# Patient Record
Sex: Female | Born: 1970 | Race: Black or African American | Hispanic: No | Marital: Married | Smoking: Never smoker
Health system: Southern US, Community
[De-identification: ages and names within clinical notes are randomized; demographics above are authoritative.]

## PROBLEM LIST (undated history)

## (undated) DIAGNOSIS — I428 Other cardiomyopathies: Secondary | ICD-10-CM

## (undated) DIAGNOSIS — I10 Essential (primary) hypertension: Secondary | ICD-10-CM

## (undated) DIAGNOSIS — E039 Hypothyroidism, unspecified: Secondary | ICD-10-CM

## (undated) DIAGNOSIS — M2550 Pain in unspecified joint: Secondary | ICD-10-CM

## (undated) DIAGNOSIS — R0600 Dyspnea, unspecified: Secondary | ICD-10-CM

## (undated) DIAGNOSIS — I509 Heart failure, unspecified: Secondary | ICD-10-CM

## (undated) DIAGNOSIS — Z91018 Allergy to other foods: Secondary | ICD-10-CM

## (undated) HISTORY — DX: Allergy to other foods: Z91.018

## (undated) HISTORY — DX: Essential (primary) hypertension: I10

## (undated) HISTORY — PX: MYOMECTOMY: SHX85

## (undated) HISTORY — DX: Dyspnea, unspecified: R06.00

## (undated) HISTORY — DX: Pain in unspecified joint: M25.50

## (undated) HISTORY — DX: Other cardiomyopathies: I42.8

## (undated) HISTORY — PX: OTHER SURGICAL HISTORY: SHX169

---

## 2002-07-23 ENCOUNTER — Emergency Department (HOSPITAL_COMMUNITY): Admission: EM | Admit: 2002-07-23 | Discharge: 2002-07-23 | Payer: Self-pay

## 2006-06-26 ENCOUNTER — Emergency Department (HOSPITAL_COMMUNITY): Admission: EM | Admit: 2006-06-26 | Discharge: 2006-06-26 | Payer: Self-pay | Admitting: Emergency Medicine

## 2007-08-23 ENCOUNTER — Ambulatory Visit: Payer: Self-pay | Admitting: Hematology and Oncology

## 2007-08-24 ENCOUNTER — Encounter: Admission: RE | Admit: 2007-08-24 | Discharge: 2007-08-24 | Payer: Self-pay | Admitting: Interventional Radiology

## 2007-08-24 LAB — CBC & DIFF AND RETIC
BASO%: 0.2 % (ref 0.0–2.0)
Basophils Absolute: 0 10*3/uL (ref 0.0–0.1)
EOS%: 4.7 % (ref 0.0–7.0)
Eosinophils Absolute: 0.3 10*3/uL (ref 0.0–0.5)
HCT: 29 % — ABNORMAL LOW (ref 34.8–46.6)
HGB: 8.8 g/dL — ABNORMAL LOW (ref 11.6–15.9)
IRF: 0.3 (ref 0.130–0.330)
LYMPH%: 24 % (ref 14.0–48.0)
MCH: 20.9 pg — ABNORMAL LOW (ref 26.0–34.0)
MCHC: 30.4 g/dL — ABNORMAL LOW (ref 32.0–36.0)
MCV: 68.6 fL — ABNORMAL LOW (ref 81.0–101.0)
MONO#: 0.4 10*3/uL (ref 0.1–0.9)
MONO%: 7.3 % (ref 0.0–13.0)
NEUT#: 3.9 10*3/uL (ref 1.5–6.5)
NEUT%: 63.8 % (ref 39.6–76.8)
Platelets: 406 10*3/uL — ABNORMAL HIGH (ref 145–400)
RBC: 4.23 10*6/uL (ref 3.70–5.32)
RDW: 17.9 % — ABNORMAL HIGH (ref 11.3–14.5)
RETIC #: 93.1 10*3/uL (ref 19.7–115.1)
Retic %: 2.2 % (ref 0.4–2.3)
WBC: 6.1 10*3/uL (ref 3.9–10.0)
lymph#: 1.5 10*3/uL (ref 0.9–3.3)

## 2007-08-26 LAB — COMPREHENSIVE METABOLIC PANEL
ALT: 24 U/L (ref 0–35)
AST: 31 U/L (ref 0–37)
Albumin: 4.1 g/dL (ref 3.5–5.2)
Alkaline Phosphatase: 84 U/L (ref 39–117)
BUN: 8 mg/dL (ref 6–23)
CO2: 25 mEq/L (ref 19–32)
Calcium: 9.4 mg/dL (ref 8.4–10.5)
Chloride: 104 mEq/L (ref 96–112)
Creatinine, Ser: 0.76 mg/dL (ref 0.40–1.20)
Glucose, Bld: 81 mg/dL (ref 70–99)
Potassium: 4.1 mEq/L (ref 3.5–5.3)
Sodium: 138 mEq/L (ref 135–145)
Total Bilirubin: 0.4 mg/dL (ref 0.3–1.2)
Total Protein: 7.8 g/dL (ref 6.0–8.3)

## 2007-08-26 LAB — IRON AND TIBC
%SAT: 42 % (ref 20–55)
Iron: 183 ug/dL — ABNORMAL HIGH (ref 42–145)
TIBC: 436 ug/dL (ref 250–470)
UIBC: 253 ug/dL

## 2007-08-26 LAB — DIRECT ANTIGLOBULIN TEST (NOT AT ARMC)
DAT (Complement): NEGATIVE
DAT IgG: NEGATIVE

## 2007-08-26 LAB — FERRITIN: Ferritin: 9 ng/mL — ABNORMAL LOW (ref 10–291)

## 2007-08-26 LAB — HEMOGLOBINOPATHY EVALUATION
Hemoglobin Other: 0 % (ref 0.0–0.0)
Hgb A2 Quant: 2.1 % — ABNORMAL LOW (ref 2.2–3.2)
Hgb A: 97.9 % — ABNORMAL HIGH (ref 96.8–97.8)
Hgb F Quant: 0 % (ref 0.0–2.0)
Hgb S Quant: 0 % (ref 0.0–0.0)

## 2007-08-26 LAB — HAPTOGLOBIN: Haptoglobin: 169 mg/dL (ref 16–200)

## 2007-08-26 LAB — LACTATE DEHYDROGENASE: LDH: 253 U/L — ABNORMAL HIGH (ref 94–250)

## 2007-08-26 LAB — VITAMIN B12: Vitamin B-12: 433 pg/mL (ref 211–911)

## 2007-08-31 LAB — VON WILLEBRAND PANEL
Factor-VIII Activity: 126 % (ref 75–150)
Ristocetin-Cofactor: 82 % (ref 50–150)
Von Willebrand Ag: 145 % normal (ref 61–164)

## 2007-08-31 LAB — FACTOR 11: Factor XI Activity: 73 % (ref 56–153)

## 2007-09-06 ENCOUNTER — Encounter: Admission: RE | Admit: 2007-09-06 | Discharge: 2007-09-06 | Payer: Self-pay | Admitting: Interventional Radiology

## 2007-09-28 LAB — CBC WITH DIFFERENTIAL/PLATELET
BASO%: 0.5 % (ref 0.0–2.0)
Basophils Absolute: 0 10*3/uL (ref 0.0–0.1)
EOS%: 6.5 % (ref 0.0–7.0)
Eosinophils Absolute: 0.4 10*3/uL (ref 0.0–0.5)
HCT: 34.9 % (ref 34.8–46.6)
HGB: 11.6 g/dL (ref 11.6–15.9)
LYMPH%: 27.4 % (ref 14.0–48.0)
MCH: 26.2 pg (ref 26.0–34.0)
MCHC: 33.3 g/dL (ref 32.0–36.0)
MCV: 78.7 fL — ABNORMAL LOW (ref 81.0–101.0)
MONO#: 0.4 10*3/uL (ref 0.1–0.9)
MONO%: 6.8 % (ref 0.0–13.0)
NEUT#: 3.2 10*3/uL (ref 1.5–6.5)
NEUT%: 58.8 % (ref 39.6–76.8)
Platelets: 305 10*3/uL (ref 145–400)
RBC: 4.43 10*6/uL (ref 3.70–5.32)
RDW: 28.5 % — ABNORMAL HIGH (ref 11.3–14.5)
WBC: 5.5 10*3/uL (ref 3.9–10.0)
lymph#: 1.5 10*3/uL (ref 0.9–3.3)

## 2007-09-28 LAB — FERRITIN: Ferritin: 59 ng/mL (ref 10–291)

## 2007-09-28 LAB — BASIC METABOLIC PANEL
BUN: 9 mg/dL (ref 6–23)
CO2: 23 mEq/L (ref 19–32)
Calcium: 9.2 mg/dL (ref 8.4–10.5)
Chloride: 106 mEq/L (ref 96–112)
Creatinine, Ser: 0.7 mg/dL (ref 0.40–1.20)
Glucose, Bld: 92 mg/dL (ref 70–99)
Potassium: 4.1 mEq/L (ref 3.5–5.3)
Sodium: 137 mEq/L (ref 135–145)

## 2007-09-28 LAB — IRON AND TIBC
%SAT: 9 % — ABNORMAL LOW (ref 20–55)
Iron: 29 ug/dL — ABNORMAL LOW (ref 42–145)
TIBC: 324 ug/dL (ref 250–470)
UIBC: 295 ug/dL

## 2007-10-26 ENCOUNTER — Ambulatory Visit (HOSPITAL_COMMUNITY): Admission: RE | Admit: 2007-10-26 | Discharge: 2007-10-27 | Payer: Self-pay | Admitting: Interventional Radiology

## 2007-11-09 ENCOUNTER — Encounter: Admission: RE | Admit: 2007-11-09 | Discharge: 2007-11-09 | Payer: Self-pay | Admitting: Interventional Radiology

## 2007-11-24 ENCOUNTER — Ambulatory Visit: Payer: Self-pay | Admitting: Hematology and Oncology

## 2007-11-26 LAB — CBC WITH DIFFERENTIAL/PLATELET
BASO%: 0.5 % (ref 0.0–2.0)
Basophils Absolute: 0 10*3/uL (ref 0.0–0.1)
EOS%: 7.4 % — ABNORMAL HIGH (ref 0.0–7.0)
Eosinophils Absolute: 0.4 10*3/uL (ref 0.0–0.5)
HCT: 37 % (ref 34.8–46.6)
HGB: 12.6 g/dL (ref 11.6–15.9)
LYMPH%: 33.1 % (ref 14.0–48.0)
MCH: 28 pg (ref 26.0–34.0)
MCHC: 34 g/dL (ref 32.0–36.0)
MCV: 82.4 fL (ref 81.0–101.0)
MONO#: 0.3 10*3/uL (ref 0.1–0.9)
MONO%: 6.6 % (ref 0.0–13.0)
NEUT#: 2.5 10*3/uL (ref 1.5–6.5)
NEUT%: 52.4 % (ref 39.6–76.8)
Platelets: 339 10*3/uL (ref 145–400)
RBC: 4.49 10*6/uL (ref 3.70–5.32)
RDW: 13.7 % (ref 11.3–14.5)
WBC: 4.8 10*3/uL (ref 3.9–10.0)
lymph#: 1.6 10*3/uL (ref 0.9–3.3)

## 2007-11-26 LAB — IRON AND TIBC
%SAT: 11 % — ABNORMAL LOW (ref 20–55)
Iron: 30 ug/dL — ABNORMAL LOW (ref 42–145)
TIBC: 277 ug/dL (ref 250–470)
UIBC: 247 ug/dL

## 2007-11-26 LAB — FERRITIN: Ferritin: 45 ng/mL (ref 10–291)

## 2007-11-26 LAB — BASIC METABOLIC PANEL
BUN: 9 mg/dL (ref 6–23)
CO2: 29 mEq/L (ref 19–32)
Calcium: 9.4 mg/dL (ref 8.4–10.5)
Chloride: 103 mEq/L (ref 96–112)
Creatinine, Ser: 0.85 mg/dL (ref 0.40–1.20)
Glucose, Bld: 84 mg/dL (ref 70–99)
Potassium: 4.3 mEq/L (ref 3.5–5.3)
Sodium: 142 mEq/L (ref 135–145)

## 2008-05-30 ENCOUNTER — Encounter: Admission: RE | Admit: 2008-05-30 | Discharge: 2008-05-30 | Payer: Self-pay | Admitting: Urology

## 2008-05-30 ENCOUNTER — Encounter: Admission: RE | Admit: 2008-05-30 | Discharge: 2008-05-30 | Payer: Self-pay | Admitting: Interventional Radiology

## 2008-11-30 ENCOUNTER — Ambulatory Visit (HOSPITAL_COMMUNITY): Admission: RE | Admit: 2008-11-30 | Discharge: 2008-11-30 | Payer: Self-pay | Admitting: Internal Medicine

## 2009-12-04 ENCOUNTER — Ambulatory Visit (HOSPITAL_COMMUNITY): Admission: RE | Admit: 2009-12-04 | Discharge: 2009-12-04 | Payer: Self-pay | Admitting: Internal Medicine

## 2010-06-01 ENCOUNTER — Inpatient Hospital Stay (HOSPITAL_COMMUNITY)
Admission: EM | Admit: 2010-06-01 | Discharge: 2010-06-04 | Payer: Self-pay | Source: Home / Self Care | Admitting: Emergency Medicine

## 2010-06-01 ENCOUNTER — Ambulatory Visit: Payer: Self-pay | Admitting: Cardiovascular Disease

## 2010-06-01 ENCOUNTER — Ambulatory Visit: Payer: Self-pay | Admitting: Internal Medicine

## 2010-06-01 ENCOUNTER — Encounter (INDEPENDENT_AMBULATORY_CARE_PROVIDER_SITE_OTHER): Payer: Self-pay | Admitting: Internal Medicine

## 2010-08-04 ENCOUNTER — Encounter: Payer: Self-pay | Admitting: Internal Medicine

## 2010-08-04 ENCOUNTER — Encounter: Payer: Self-pay | Admitting: Interventional Radiology

## 2010-09-24 LAB — CBC
HCT: 40.1 % (ref 36.0–46.0)
HCT: 40.8 % (ref 36.0–46.0)
HCT: 41.5 % (ref 36.0–46.0)
Hemoglobin: 13.3 g/dL (ref 12.0–15.0)
Hemoglobin: 13.5 g/dL (ref 12.0–15.0)
Hemoglobin: 13.8 g/dL (ref 12.0–15.0)
MCH: 29.2 pg (ref 26.0–34.0)
MCH: 29.5 pg (ref 26.0–34.0)
MCH: 29.5 pg (ref 26.0–34.0)
MCHC: 33.1 g/dL (ref 30.0–36.0)
MCHC: 33.2 g/dL (ref 30.0–36.0)
MCHC: 33.3 g/dL (ref 30.0–36.0)
MCV: 87.7 fL (ref 78.0–100.0)
MCV: 88.9 fL (ref 78.0–100.0)
MCV: 89.3 fL (ref 78.0–100.0)
Platelets: 247 10*3/uL (ref 150–400)
Platelets: 282 10*3/uL (ref 150–400)
Platelets: 286 10*3/uL (ref 150–400)
RBC: 4.51 MIL/uL (ref 3.87–5.11)
RBC: 4.57 MIL/uL (ref 3.87–5.11)
RBC: 4.73 MIL/uL (ref 3.87–5.11)
RDW: 12.6 % (ref 11.5–15.5)
RDW: 13 % (ref 11.5–15.5)
RDW: 13 % (ref 11.5–15.5)
WBC: 4.7 10*3/uL (ref 4.0–10.5)
WBC: 6.4 10*3/uL (ref 4.0–10.5)
WBC: 6.6 10*3/uL (ref 4.0–10.5)

## 2010-09-24 LAB — HEMOGLOBIN A1C
Hgb A1c MFr Bld: 5.1 % (ref <5.7)
Hgb A1c MFr Bld: 5.2 % (ref <5.7)
Mean Plasma Glucose: 100 mg/dL (ref <117)
Mean Plasma Glucose: 103 mg/dL (ref <117)

## 2010-09-24 LAB — DIFFERENTIAL
Basophils Absolute: 0 10*3/uL (ref 0.0–0.1)
Basophils Absolute: 0 10*3/uL (ref 0.0–0.1)
Basophils Relative: 0 % (ref 0–1)
Basophils Relative: 0 % (ref 0–1)
Eosinophils Absolute: 0.4 10*3/uL (ref 0.0–0.7)
Eosinophils Absolute: 0.5 10*3/uL (ref 0.0–0.7)
Eosinophils Relative: 7 % — ABNORMAL HIGH (ref 0–5)
Eosinophils Relative: 9 % — ABNORMAL HIGH (ref 0–5)
Lymphocytes Relative: 37 % (ref 12–46)
Lymphocytes Relative: 44 % (ref 12–46)
Lymphs Abs: 1.8 10*3/uL (ref 0.7–4.0)
Lymphs Abs: 2.9 10*3/uL (ref 0.7–4.0)
Monocytes Absolute: 0.4 10*3/uL (ref 0.1–1.0)
Monocytes Absolute: 0.4 10*3/uL (ref 0.1–1.0)
Monocytes Relative: 5 % (ref 3–12)
Monocytes Relative: 8 % (ref 3–12)
Neutro Abs: 2.2 10*3/uL (ref 1.7–7.7)
Neutro Abs: 2.9 10*3/uL (ref 1.7–7.7)
Neutrophils Relative %: 43 % (ref 43–77)
Neutrophils Relative %: 46 % (ref 43–77)

## 2010-09-24 LAB — TROPONIN I: Troponin I: 0.03 ng/mL (ref 0.00–0.06)

## 2010-09-24 LAB — CARDIAC PANEL(CRET KIN+CKTOT+MB+TROPI)
CK, MB: 7.4 ng/mL (ref 0.3–4.0)
CK, MB: 9.8 ng/mL (ref 0.3–4.0)
Relative Index: 0.8 (ref 0.0–2.5)
Relative Index: 0.9 (ref 0.0–2.5)
Total CK: 1143 U/L — ABNORMAL HIGH (ref 7–177)
Total CK: 981 U/L — ABNORMAL HIGH (ref 7–177)
Troponin I: 0.02 ng/mL (ref 0.00–0.06)
Troponin I: 0.02 ng/mL (ref 0.00–0.06)

## 2010-09-24 LAB — BASIC METABOLIC PANEL
BUN: 7 mg/dL (ref 6–23)
BUN: 7 mg/dL (ref 6–23)
BUN: 8 mg/dL (ref 6–23)
BUN: 9 mg/dL (ref 6–23)
CO2: 27 mEq/L (ref 19–32)
CO2: 28 mEq/L (ref 19–32)
CO2: 29 mEq/L (ref 19–32)
CO2: 31 mEq/L (ref 19–32)
Calcium: 8.7 mg/dL (ref 8.4–10.5)
Calcium: 8.8 mg/dL (ref 8.4–10.5)
Calcium: 9.1 mg/dL (ref 8.4–10.5)
Calcium: 9.2 mg/dL (ref 8.4–10.5)
Chloride: 104 mEq/L (ref 96–112)
Chloride: 105 mEq/L (ref 96–112)
Chloride: 106 mEq/L (ref 96–112)
Chloride: 107 mEq/L (ref 96–112)
Creatinine, Ser: 0.73 mg/dL (ref 0.4–1.2)
Creatinine, Ser: 0.83 mg/dL (ref 0.4–1.2)
Creatinine, Ser: 0.87 mg/dL (ref 0.4–1.2)
Creatinine, Ser: 0.92 mg/dL (ref 0.4–1.2)
GFR calc Af Amer: >60 mL/min (ref >60)
GFR calc Af Amer: >60 mL/min (ref >60)
GFR calc Af Amer: >60 mL/min (ref >60)
GFR calc Af Amer: >60 mL/min (ref >60)
GFR calc non Af Amer: >60 mL/min (ref >60)
GFR calc non Af Amer: >60 mL/min (ref >60)
GFR calc non Af Amer: >60 mL/min (ref >60)
GFR calc non Af Amer: >60 mL/min (ref >60)
Glucose, Bld: 100 mg/dL — ABNORMAL HIGH (ref 70–99)
Glucose, Bld: 110 mg/dL — ABNORMAL HIGH (ref 70–99)
Glucose, Bld: 84 mg/dL (ref 70–99)
Glucose, Bld: 97 mg/dL (ref 70–99)
Potassium: 3 mEq/L — ABNORMAL LOW (ref 3.5–5.1)
Potassium: 3.9 mEq/L (ref 3.5–5.1)
Potassium: 4.1 mEq/L (ref 3.5–5.1)
Potassium: 4.1 mEq/L (ref 3.5–5.1)
Sodium: 137 mEq/L (ref 135–145)
Sodium: 140 mEq/L (ref 135–145)
Sodium: 140 mEq/L (ref 135–145)
Sodium: 142 mEq/L (ref 135–145)

## 2010-09-24 LAB — CK TOTAL AND CKMB (NOT AT ARMC)
CK, MB: 10.2 ng/mL (ref 0.3–4.0)
CK, MB: 12.2 ng/mL (ref 0.3–4.0)
Relative Index: 0.7 (ref 0.0–2.5)
Relative Index: 0.8 (ref 0.0–2.5)
Total CK: 1365 U/L — ABNORMAL HIGH (ref 7–177)
Total CK: 1498 U/L — ABNORMAL HIGH (ref 7–177)

## 2010-09-24 LAB — POCT I-STAT 3, VENOUS BLOOD GAS (G3P V)
Acid-base deficit: 2 mmol/L (ref 0.0–2.0)
Bicarbonate: 23.7 mEq/L (ref 20.0–24.0)
O2 Saturation: 68 %
TCO2: 25 mmol/L (ref 0–100)
pCO2, Ven: 43.1 mmHg — ABNORMAL LOW (ref 45.0–50.0)
pH, Ven: 7.349 — ABNORMAL HIGH (ref 7.250–7.300)
pO2, Ven: 38 mmHg (ref 30.0–45.0)

## 2010-09-24 LAB — BRAIN NATRIURETIC PEPTIDE
Pro B Natriuretic peptide (BNP): 273 pg/mL — ABNORMAL HIGH (ref 0.0–100.0)
Pro B Natriuretic peptide (BNP): 676 pg/mL — ABNORMAL HIGH (ref 0.0–100.0)
Pro B Natriuretic peptide (BNP): 762 pg/mL — ABNORMAL HIGH (ref 0.0–100.0)

## 2010-09-24 LAB — LIPID PANEL
Cholesterol: 121 mg/dL (ref 0–200)
HDL: 36 mg/dL — ABNORMAL LOW (ref >39)
LDL Cholesterol: 79 mg/dL (ref 0–99)
Total CHOL/HDL Ratio: 3.4 RATIO
Triglycerides: 29 mg/dL (ref <150)
VLDL: 6 mg/dL (ref 0–40)

## 2010-09-24 LAB — PROTIME-INR
INR: 1 (ref 0.00–1.49)
INR: 1.03 (ref 0.00–1.49)
INR: 1.04 (ref 0.00–1.49)
Prothrombin Time: 13.4 seconds (ref 11.6–15.2)
Prothrombin Time: 13.7 seconds (ref 11.6–15.2)
Prothrombin Time: 13.8 seconds (ref 11.6–15.2)

## 2010-09-24 LAB — MAGNESIUM
Magnesium: 1.8 mg/dL (ref 1.5–2.5)
Magnesium: 2 mg/dL (ref 1.5–2.5)

## 2010-09-24 LAB — POCT I-STAT 3, ART BLOOD GAS (G3+)
Acid-base deficit: 2 mmol/L (ref 0.0–2.0)
Bicarbonate: 23.8 mEq/L (ref 20.0–24.0)
O2 Saturation: 91 %
TCO2: 25 mmol/L (ref 0–100)
pCO2 arterial: 44.7 mmHg (ref 35.0–45.0)
pH, Arterial: 7.334 — ABNORMAL LOW (ref 7.350–7.400)
pO2, Arterial: 65 mmHg — ABNORMAL LOW (ref 80.0–100.0)

## 2010-09-24 LAB — TSH
TSH: 2.5 u[IU]/mL (ref 0.350–4.500)
TSH: 3.308 u[IU]/mL (ref 0.350–4.500)

## 2010-09-24 LAB — APTT: aPTT: 33 seconds (ref 24–37)

## 2010-09-24 LAB — PREGNANCY, URINE: Preg Test, Ur: NEGATIVE

## 2011-04-08 LAB — CBC
HCT: 38.8
Hemoglobin: 13.1
MCHC: 33.7
MCV: 83.3
Platelets: 308
RBC: 4.65
RDW: 22.8 — ABNORMAL HIGH
WBC: 5.5

## 2011-04-08 LAB — CREATININE, SERUM
Creatinine, Ser: 0.78
GFR calc Af Amer: >60
GFR calc non Af Amer: >60

## 2011-04-08 LAB — HCG, SERUM, QUALITATIVE: Preg, Serum: NEGATIVE

## 2013-03-04 DIAGNOSIS — L910 Hypertrophic scar: Secondary | ICD-10-CM | POA: Insufficient documentation

## 2013-06-09 ENCOUNTER — Other Ambulatory Visit: Payer: Self-pay | Admitting: Interventional Cardiology

## 2013-09-29 ENCOUNTER — Other Ambulatory Visit: Payer: Self-pay | Admitting: Interventional Cardiology

## 2013-12-20 ENCOUNTER — Encounter: Payer: Self-pay | Admitting: Interventional Cardiology

## 2014-01-17 ENCOUNTER — Ambulatory Visit: Payer: Self-pay | Admitting: Interventional Cardiology

## 2014-01-19 ENCOUNTER — Other Ambulatory Visit: Payer: Self-pay | Admitting: Interventional Cardiology

## 2014-02-09 ENCOUNTER — Encounter: Payer: Self-pay | Admitting: Interventional Cardiology

## 2014-02-09 ENCOUNTER — Ambulatory Visit (INDEPENDENT_AMBULATORY_CARE_PROVIDER_SITE_OTHER): Admitting: Interventional Cardiology

## 2014-02-09 VITALS — BP 120/80 | HR 63 | Ht 68.0 in | Wt 238.1 lb

## 2014-02-09 DIAGNOSIS — Z6841 Body Mass Index (BMI) 40.0 and over, adult: Secondary | ICD-10-CM | POA: Insufficient documentation

## 2014-02-09 DIAGNOSIS — E039 Hypothyroidism, unspecified: Secondary | ICD-10-CM | POA: Insufficient documentation

## 2014-02-09 DIAGNOSIS — I10 Essential (primary) hypertension: Secondary | ICD-10-CM

## 2014-02-09 DIAGNOSIS — E0789 Other specified disorders of thyroid: Secondary | ICD-10-CM

## 2014-02-09 DIAGNOSIS — I5042 Chronic combined systolic (congestive) and diastolic (congestive) heart failure: Secondary | ICD-10-CM

## 2014-02-09 DIAGNOSIS — I5043 Acute on chronic combined systolic (congestive) and diastolic (congestive) heart failure: Secondary | ICD-10-CM | POA: Insufficient documentation

## 2014-02-09 DIAGNOSIS — E038 Other specified hypothyroidism: Secondary | ICD-10-CM | POA: Insufficient documentation

## 2014-02-09 DIAGNOSIS — E034 Atrophy of thyroid (acquired): Secondary | ICD-10-CM

## 2014-02-09 NOTE — Patient Instructions (Addendum)
Your physician recommends that you continue on your current medications as directed. Please refer to the Current Medication list given to you today.  You have been referred to Kerby Less (Dr.Kerr)  Your physician wants you to follow-up in: 1 year with Dr.Smith You will receive a reminder letter in the mail two months in advance. If you don't receive a letter, please call our office to schedule the follow-up appointment.

## 2014-02-09 NOTE — Progress Notes (Signed)
Patient ID: Suzanne Long, female   DOB: 02/12/1971, 43 y.o.   MRN: [id number]    [address] [place],   [address] Phone: [phone] Fax:  [phone]  Date:  1/66/0630   ID:  Suzanne Long, DOB [birth year], MRN [id number]  PCP:  No primary provider on file.   ASSESSMENT:  1. Chronic combined systolic and diastolic heart failure 2. Hypertension, controlled 3. Obesity with weight loss 4. Hypothyroidism not been adequately treated  PLAN:  1. Continue the current cardiovascular management 2. Endocrinology consult 3. Continue aerobic activity and weight loss   SUBJECTIVE: Suzanne Long is a 43 y.o. female was concerned as she has been working out on a regular basis for nearly a year and has not lost any weight. She has hypothyroidism but has stopped taking Synthroid. She wants to manage her thyroid problem naturally. She denies cardiopulmonary complaints. There is no dyspnea. She is taking her medications regularly.   Wt Readings from Last 3 Encounters:  02/09/14 238 lb 1.9 oz (108.011 kg)     No past medical history on file.  Current Outpatient Prescriptions  Medication Sig Dispense Refill  . amLODipine (NORVASC) 5 MG tablet take 1 tablet by mouth once daily  30 tablet  1  . carvedilol (COREG) 25 MG tablet take 1 tablet by mouth twice a day with food  60 tablet  4  . furosemide (LASIX) 40 MG tablet take 1 tablet by mouth once daily  30 tablet  4  . lisinopril (PRINIVIL,ZESTRIL) 40 MG tablet take 1 tablet by mouth once daily  30 tablet  6   No current facility-administered medications for this visit.    Allergies:    Allergies  Allergen Reactions  . Shellfish Allergy Anaphylaxis    Uncoded Allergy. Allergen: SHELLFISH  . Iohexol      Desc: vomiting after 20 cc multihance injection     Social History:  The patient  reports that she has never smoked. She does not have any smokeless tobacco history on file.   ROS:  Please see the history of  present illness.   She denies heat and cold intolerance. She denies excessive fatigue. No snoring.   All other systems reviewed and negative.   OBJECTIVE: VS:  BP 120/80  Pulse 63  Ht $R'5\' 8"'Kr$  (1.727 m)  Wt 238 lb 1.9 oz (108.011 kg)  BMI 36.21 kg/m2 Well nourished, well developed, in no acute distress, obese but improved since my last office visit HEENT: normal Neck: JVD flat. Carotid bruit absent  Cardiac:  normal S1, S2; RRR; no murmur Lungs:  clear to auscultation bilaterally, no wheezing, rhonchi or rales Abd: soft, nontender, no hepatomegaly Ext: Edema absent. Pulses 2+ and symmetric Skin: warm and dry Neuro:  CNs 2-12 intact, no focal abnormalities noted  EKG:  Normal sinus rhythm with T-wave abnormality lead 1, lead aVL, and V4 through V6. No significant change when compared to prior.       Signed, Illene Labrador III, MD 02/09/2014 11:56 AM

## 2014-03-23 ENCOUNTER — Other Ambulatory Visit: Payer: Self-pay

## 2014-03-23 DIAGNOSIS — E039 Hypothyroidism, unspecified: Secondary | ICD-10-CM

## 2014-04-13 ENCOUNTER — Ambulatory Visit: Admitting: Endocrinology

## 2014-06-27 ENCOUNTER — Other Ambulatory Visit: Payer: Self-pay | Admitting: *Deleted

## 2014-06-27 MED ORDER — LISINOPRIL 40 MG PO TABS: 40.0000 mg | ORAL_TABLET | Freq: Every day | ORAL | Status: DC

## 2014-07-27 ENCOUNTER — Encounter: Payer: Self-pay | Admitting: Neurology

## 2014-07-27 ENCOUNTER — Ambulatory Visit (INDEPENDENT_AMBULATORY_CARE_PROVIDER_SITE_OTHER): Admitting: Neurology

## 2014-07-27 VITALS — BP 141/90 | HR 60 | Ht 68.5 in | Wt 235.4 lb

## 2014-07-27 DIAGNOSIS — H4710 Unspecified papilledema: Secondary | ICD-10-CM

## 2014-07-27 NOTE — Progress Notes (Addendum)
[facility]    Provider:  Dr Jaynee Eagles Referring Provider: Calton Dach, MD Primary Care Physician:  No primary care provider on file.  CC:  Papilledema  HPI:  Suzanne Long is a 44 y.o. 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 PMHx HTN, hypothyroidism. She was in her usual state of health. She went to her eye doctor just for a regular exam to get new glasses and contacts. Optometrist observed disk swelling the back of the right eye. Vision is worsening in both eyes and she really didn't notice anything out of the ordinary, just age-related vision changes. Right eye vision is worse than left. No idea when the papilledema started, the vision changes have been ongoing and gradual and she hasn't been to an eye doctor in 5 years. She has occ headaches but nothing significant. No double vision. No pain on eye movement. No vision loss. Deies ingestion of toxins. She does have HTN and occ doesn't take her medications. If she forgets her medication she has blood pressure above the 200s, takes her medications regularly except for maybe a few times a month. Denies any focal neurologic changes. Denies significant weight gain.     Reviewed notes, labs and imaging from outside physicians, which showed: Optometrist describes optic nerve edema without vasculopathy of the retina with a drop in best corrected vision OD. There is no indication how long the right eye vision has been worse than the left. Last exam in 2010 showed 20/20 vision in both eyes.   Review of Systems: Patient complains of symptoms per HPI as well as the following symptoms blurred vision. No CP, no SOB.  Pertinent negatives per HPI. All others negative.   History   Social History  . Marital Status: Married    Spouse Name: [name]    Number of Children: 2  . Years of Education: 12   Occupational History  . Not on file.   Social History Main Topics  . Smoking status: Never Smoker   .  Smokeless tobacco: Never Used  . Alcohol Use: 0.0 oz/week    0 Not specified per week     Comment: Occ  . Drug Use: No  . Sexual Activity: Not on file   Other Topics Concern  . Not on file   Social History Narrative   Patient is married husband name [name]    Patient has 2 children. Both has past away.    Patient is right handed.    Patient is unemployed    Patient has a high school education        Family History  Problem Relation Age of Onset  . Migraines Neg Hx   . Multiple sclerosis Neg Hx     Past Medical History  Diagnosis Date  . Hypertension     Past Surgical History  Procedure Laterality Date  . None      Current Outpatient Prescriptions  Medication Sig Dispense Refill  . amLODipine (NORVASC) 5 MG tablet take 1 tablet by mouth once daily 30 tablet 1  . carvedilol (COREG) 25 MG tablet take 1 tablet by mouth twice a day with food 60 tablet 4  . furosemide (LASIX) 40 MG tablet take 1 tablet by mouth once daily 30 tablet 4  . lisinopril (PRINIVIL,ZESTRIL) 40 MG tablet Take 1 tablet (40 mg total) by mouth daily. 30 tablet 6   No current facility-administered medications for this visit.    Allergies as of 07/27/2014 - Review Complete  07/27/2014  Allergen Reaction Noted  . Shellfish allergy Anaphylaxis 02/09/2014  . Iohexol  05/30/2008    Vitals: BP 141/90 mmHg  Pulse 60  Ht 5' 8.5" (1.74 m)  Wt 235 lb 6.4 oz (106.777 kg)  BMI 35.27 kg/m2 Last Weight:  Wt Readings from Last 1 Encounters:  07/27/14 235 lb 6.4 oz (106.777 kg)   Last Height:   Ht Readings from Last 1 Encounters:  07/27/14 5' 8.5" (1.74 m)   Physical exam: Exam: Gen: NAD, conversant, well nourised, obese            CV: RRR, no MRG. No Carotid Bruits. No peripheral edema, warm, nontender Eyes: Conjunctivae clear without exudates or hemorrhage  Neuro: Detailed Neurologic Exam  Speech:    Speech is normal; fluent and spontaneous with normal comprehension.  Cognition:    The  patient is oriented to person, place, and time;     recent and remote memory intact;     language fluent;     normal attention, concentration,     fund of knowledge Cranial Nerves:    The pupils are equal, round, and reactive to light. Mild blurring of the disc margins rt.  Rt 20/100, left 20/30. Visual fields are full to finger confrontation. Extraocular movements are intact. Trigeminal sensation is intact and the muscles of mastication are normal. The face is symmetric. The palate elevates in the midline. Hearing intact. Voice is normal. Shoulder shrug is normal. The tongue has normal motion without fasciculations.   Coordination:    Normal finger to nose and heel to shin. Normal rapid alternating movements.   Gait:    Heel-toe and tandem gait are normal.   Motor Observation:    No asymmetry, no atrophy, and no involuntary movements noted. Tone:    Normal muscle tone.    Posture:    Posture is normal. normal erect    Strength:    Strength is V/V in the upper and lower limbs.      Sensation: intact     Reflex Exam:  DTR's:    Deep tendon reflexes in the upper and lower extremities are normal bilaterally.   Toes:    The toes are downgoing bilaterally.   Clonus:    Clonus is absent.       Assessment/Plan:  44 year old female with HTN who presents with right disk abnormalities and worsening/blurry vision. Denies any other symptoms. 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 If neuroimaging is negative will proceed to LP and VF testing. MRI of the brain and orbits. Will order labs for other disorders that can be implicated in papilledema such as hypercoagulable disorders or sarcoid.    Sarina Ill, MD  [facility] [address] [place], [place] [address]  Phone [phone] Fax [phone]  A total of 30 minutes was spent in with this patient.  Over half this time was spent on counseling patient on the diagnosis and different therapeutic options available.

## 2014-07-27 NOTE — Patient Instructions (Signed)
Overall you are doing fairly well but I do want to suggest a few things today:   Remember to drink plenty of fluid, eat healthy meals and do not skip any meals. Try to eat protein with a every meal and eat a healthy snack such as fruit or nuts in between meals. Try to keep a regular sleep-wake schedule and try to exercise daily, particularly in the form of walking, 20-30 minutes a day, if you can.   As far as diagnostic testing: MRI of the brain, labwork  I would like to see you back in 6 weeks, sooner if we need to. Please call us with any interim questions, concerns, problems, updates or refill requests.   Please also call us for any test results so we can go over those with you on the phone.  My clinical assistant and will answer any of your questions and relay your messages to me and also relay most of my messages to you.   Our phone number is [phone]. We also have an after hours call service for urgent matters and there is a physician on-call for urgent questions. For any emergencies you know to call 911 or go to the nearest emergency room

## 2014-07-28 LAB — RPR: RPR Ser Ql: NONREACTIVE

## 2014-08-07 LAB — SEDIMENTATION RATE: Sed Rate: 7 mm/hr (ref 0–32)

## 2014-08-07 LAB — HYPERCOAGULABLE PANEL, COMPREHENSIVE
APTT: 31.9 s
AT III Act/Nor PPP Chro: 104 %
Act. Prt C Resist w/FV Defic.: 2.4 ratio
Anticardiolipin Ab, IgG: <10 [GPL'U]
Anticardiolipin Ab, IgM: <10 [MPL'U]
Beta-2 Glycoprotein I, IgA: <10 SAU
Beta-2 Glycoprotein I, IgG: <10 SGU
Beta-2 Glycoprotein I, IgM: <10 SMU
DRVVT Screen Seconds: 51.1 s
Factor VII Antigen**: 84 %
Factor VIII Activity: 183 % — ABNORMAL HIGH
Hexagonal Phospholipid Neutral: 0 s
Homocysteine: 10.3 umol/L
Prot C Ag Act/Nor PPP Imm: 86 %
Prot S Ag Act/Nor PPP Imm: 87 %
Protein C Ag/FVII Ag Ratio**: 1 ratio
Protein S Ag/FVII Ag Ratio**: 1 ratio

## 2014-08-07 LAB — CBC
HCT: 42.5 % (ref 34.0–46.6)
Hemoglobin: 14.4 g/dL (ref 11.1–15.9)
MCH: 30.1 pg (ref 26.6–33.0)
MCHC: 33.9 g/dL (ref 31.5–35.7)
MCV: 89 fL (ref 79–97)
Platelets: 270 10*3/uL (ref 150–379)
RBC: 4.79 x10E6/uL (ref 3.77–5.28)
RDW: 14.3 % (ref 12.3–15.4)
WBC: 5.7 10*3/uL (ref 3.4–10.8)

## 2014-08-07 LAB — BASIC METABOLIC PANEL
BUN/Creatinine Ratio: 13 (ref 9–23)
BUN: 12 mg/dL (ref 6–24)
CO2: 27 mmol/L (ref 18–29)
Calcium: 9.4 mg/dL (ref 8.7–10.2)
Chloride: 100 mmol/L (ref 97–108)
Creatinine, Ser: 0.89 mg/dL (ref 0.57–1.00)
GFR calc Af Amer: 92 mL/min/{1.73_m2} (ref >59)
GFR calc non Af Amer: 80 mL/min/{1.73_m2} (ref >59)
Glucose: 67 mg/dL (ref 65–99)
Potassium: 3.6 mmol/L (ref 3.5–5.2)
Sodium: 140 mmol/L (ref 134–144)

## 2014-08-07 LAB — HEMOGLOBIN A1C
Est. average glucose Bld gHb Est-mCnc: 108 mg/dL
Hgb A1c MFr Bld: 5.4 % (ref 4.8–5.6)

## 2014-08-07 LAB — HIV ANTIBODY (ROUTINE TESTING W REFLEX)
HIV 1/O/2 Abs-Index Value: <1 (ref <1.00)
HIV-1/HIV-2 Ab: NONREACTIVE

## 2014-08-07 LAB — HEMOGLOBINOPATHY EVALUATION
Hgb A2 Quant: 2.2 % (ref 0.7–3.1)
Hgb A: 97.8 % (ref 94.0–98.0)
Hgb C: 0 %
Hgb F Quant: 0 % (ref 0.0–2.0)
Hgb S: 0 %
Hgb Solubility: NEGATIVE

## 2014-08-07 LAB — PAN-ANCA
ANCA Proteinase 3: <3.5 U/mL (ref 0.0–3.5)
Atypical pANCA: <1:20 {titer}
C-ANCA: <1:20 {titer}
Myeloperoxidase Ab: <9 U/mL (ref 0.0–9.0)
P-ANCA: <1:20 {titer}

## 2014-08-07 LAB — HEAVY METALS, BLOOD
Arsenic: 7 ug/L (ref 2–23)
Lead, Blood: NOT DETECTED ug/dL (ref 0–19)
Mercury: 1.4 ug/L (ref 0.0–14.9)

## 2014-08-07 LAB — ANGIOTENSIN CONVERTING ENZYME: Angio Convert Enzyme: <14 U/L — ABNORMAL LOW (ref 14–82)

## 2014-08-07 LAB — TSH: TSH: 0.822 u[IU]/mL (ref 0.450–4.500)

## 2014-08-07 LAB — ANA W/REFLEX: Anti Nuclear Antibody(ANA): NEGATIVE

## 2014-08-07 LAB — RPR QUALITATIVE: RPR Ser Ql: NONREACTIVE

## 2014-08-07 LAB — HIGH SENSITIVITY CRP: CRP, High Sensitivity: 8.1 mg/L — ABNORMAL HIGH (ref 0.00–3.00)

## 2014-08-10 ENCOUNTER — Ambulatory Visit
Admission: RE | Admit: 2014-08-10 | Discharge: 2014-08-10 | Disposition: A | Discharge status: Home / Self Care | Source: Ambulatory Visit | Attending: Neurology | Admitting: Neurology

## 2014-08-10 ENCOUNTER — Other Ambulatory Visit: Payer: Self-pay | Admitting: Neurology

## 2014-08-10 DIAGNOSIS — H4710 Unspecified papilledema: Secondary | ICD-10-CM

## 2014-08-16 ENCOUNTER — Telehealth: Payer: Self-pay

## 2014-08-16 NOTE — Telephone Encounter (Signed)
Spoke to patient. Gave lab results. Patient verbalized understanding.  

## 2014-08-16 NOTE — Telephone Encounter (Signed)
-----   Message from Melvenia Beam, MD sent at 08/14/2014  5:35 PM EST ----- Please let patient know all her labs were either normal, within normal limits or, if mildly elevated, not concerning. We can discuss in detail before you call, thank you.

## 2014-08-22 ENCOUNTER — Telehealth: Payer: Self-pay | Admitting: *Deleted

## 2014-08-23 ENCOUNTER — Ambulatory Visit
Admission: RE | Admit: 2014-08-23 | Discharge: 2014-08-23 | Disposition: A | Discharge status: Home / Self Care | Source: Ambulatory Visit | Attending: Neurology | Admitting: Neurology

## 2014-08-23 DIAGNOSIS — H4710 Unspecified papilledema: Secondary | ICD-10-CM

## 2014-08-23 MED ORDER — GADOBENATE DIMEGLUMINE 529 MG/ML IV SOLN
20.0000 mL | Freq: Once | INTRAVENOUS | Status: AC | PRN
  Administered 2014-08-23: 20 mL via INTRAVENOUS

## 2014-08-29 ENCOUNTER — Other Ambulatory Visit: Payer: Self-pay | Admitting: Neurology

## 2014-08-29 ENCOUNTER — Telehealth: Payer: Self-pay | Admitting: Neurology

## 2014-08-29 DIAGNOSIS — H4710 Unspecified papilledema: Secondary | ICD-10-CM

## 2014-08-29 NOTE — Telephone Encounter (Signed)
Dr. Jaynee Eagles already called patient and spoke with her.

## 2014-08-29 NOTE — Telephone Encounter (Signed)
Spoke to patient today, MRI of the brain unremarkable. Discussed labs, unremarkable. 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 Will refer to Ophthalmology for dilated exam and VF testing. Workup so far negative likely pseudopapilledema.

## 2014-09-07 ENCOUNTER — Ambulatory Visit: Admitting: Neurology

## 2014-09-19 ENCOUNTER — Ambulatory Visit (INDEPENDENT_AMBULATORY_CARE_PROVIDER_SITE_OTHER): Admitting: Neurology

## 2014-09-19 ENCOUNTER — Encounter: Payer: Self-pay | Admitting: Neurology

## 2014-09-19 VITALS — BP 139/83 | HR 75 | Ht 68.5 in | Wt 240.0 lb

## 2014-09-19 DIAGNOSIS — G932 Benign intracranial hypertension: Secondary | ICD-10-CM | POA: Diagnosis not present

## 2014-09-19 NOTE — Patient Instructions (Addendum)
Please call [facility] for scheduling of LP [phone]

## 2014-09-19 NOTE — Progress Notes (Signed)
[facility]    Provider:  Dr Jaynee Eagles Referring Provider: No ref. provider found Primary Care Physician:  REDMON,NOELLE, PA-C  CC: Papilledema  HPI: Suzanne Long is a 44 y.o. 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 PMHx HTN, hypothyroidism. She was in her usual state of health. She went to her eye doctor just for a regular exam to get new glasses and contacts. Optometrist observed disk swelling the back of the right eye. Vision is worsening in both eyes and she really didn't notice anything out of the ordinary, just age-related vision changes. Right eye vision is worse than left. No idea when the papilledema started, the vision changes have been ongoing and gradual and she hasn't been to an eye doctor in 5 years. She has occ headaches but nothing significant. No double vision. No pain on eye movement. No vision loss. Deies ingestion of toxins. She does have HTN and occ doesn't take her medications. If she forgets her medication she has blood pressure above the 200s, takes her medications regularly except for maybe a few times a month. Denies any focal neurologic changes. Denies significant weight gain.     Reviewed notes, labs and imaging from outside physicians, which showed: Optometrist describes optic nerve edema without vasculopathy of the retina with a drop in best corrected vision OD. There is no indication how long the right eye vision has been worse than the left. Last exam in 2010 showed 20/20 vision in both eyes.  Review of Systems: Patient complains of symptoms per HPI as well as the following symptoms:No CP, no SOB. Pertinent negatives per HPI. All others negative.   History   Social History  . Marital Status: Married    Spouse Name: Suzanne Long  . Number of Children: 2  . Years of Education: 12   Occupational History  . Not on file.   Social History Main Topics  . Smoking status: Never Smoker   . Smokeless tobacco: Never Used  .  Alcohol Use: 0.0 oz/week    0 Standard drinks or equivalent per week     Comment: Occ  . Drug Use: No  . Sexual Activity: Not on file   Other Topics Concern  . Not on file   Social History Narrative   Patient is married husband name Suzanne Long    Patient has 2 children. Both has past away.    Patient is right handed.    Patient is unemployed    Patient has a high school education        Family History  Problem Relation Age of Onset  . Migraines Neg Hx   . Multiple sclerosis Neg Hx     Past Medical History  Diagnosis Date  . Hypertension     Past Surgical History  Procedure Laterality Date  . None      Current Outpatient Prescriptions  Medication Sig Dispense Refill  . amLODipine (NORVASC) 5 MG tablet take 1 tablet by mouth once daily 30 tablet 1  . carvedilol (COREG) 25 MG tablet take 1 tablet by mouth twice a day with food 60 tablet 4  . furosemide (LASIX) 40 MG tablet take 1 tablet by mouth once daily 30 tablet 4  . lisinopril (PRINIVIL,ZESTRIL) 40 MG tablet Take 1 tablet (40 mg total) by mouth daily. 30 tablet 6   No current facility-administered medications for this visit.    Allergies as of 09/19/2014 - Review Complete 09/19/2014  Allergen Reaction Noted  . Shellfish allergy Anaphylaxis  02/09/2014  . Iohexol  05/30/2008    Vitals: BP 139/83 mmHg  Pulse 75  Ht 5' 8.5" (1.74 m)  Wt 240 lb (108.863 kg)  BMI 35.96 kg/m2 Last Weight:  Wt Readings from Last 1 Encounters:  09/19/14 240 lb (108.863 kg)   Last Height:   Ht Readings from Last 1 Encounters:  09/19/14 5' 8.5" (1.74 m)    Speech:  Speech is normal; fluent and spontaneous with normal comprehension.  Cognition:  The patient is oriented to person, place, and time;   recent and remote memory intact;   language fluent;   normal attention, concentration,   fund of knowledge Cranial Nerves:  The pupils are equal, round, and reactive to light. Mild blurring of the disc margins  rt. Rt 20/100, left 20/30. Visual fields are full to finger confrontation. Extraocular movements are intact. Trigeminal sensation is intact and the muscles of mastication are normal. The face is symmetric. The palate elevates in the midline. Hearing intact. Voice is normal. Shoulder shrug is normal. The tongue has normal motion without fasciculations.      Assessment/Plan: 44 year old female with HTN who presents with right disk abnormalities and worsening/blurry vision. Denies any other symptoms. 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 Neuroimaging is negative so will proceed to LP and VF testing.   Spoke to Ophthalmologist, he agrees we should proceed with LP will order Discussed at length, will proceed.   Sarina Ill, MD  [facility] [address] [place], [place] [address]  Phone [phone] Fax [phone]  A total of 15 minutes was spent face-to-face with this patient. Over half this time was spent on counseling patient on the II diagnosis and different diagnostic and therapeutic options available.

## 2014-09-25 ENCOUNTER — Ambulatory Visit
Admission: RE | Admit: 2014-09-25 | Discharge: 2014-09-25 | Disposition: A | Discharge status: Home / Self Care | Source: Ambulatory Visit | Attending: Neurology | Admitting: Neurology

## 2014-09-25 ENCOUNTER — Telehealth: Payer: Self-pay | Admitting: Neurology

## 2014-09-25 ENCOUNTER — Other Ambulatory Visit: Payer: Self-pay | Admitting: Neurology

## 2014-09-25 DIAGNOSIS — G932 Benign intracranial hypertension: Secondary | ICD-10-CM

## 2014-09-25 LAB — CSF CELL COUNT WITH DIFFERENTIAL
RBC Count, CSF: 2 cu mm — ABNORMAL HIGH
Tube #: 4
WBC, CSF: 1 cu mm (ref 0–5)

## 2014-09-25 NOTE — Telephone Encounter (Signed)
Anderson Malta with [facility] is calling as [address] wants to know if you want glucose and protein tests for patient sent to [facility] today or is it OK to wait until tomorrow.  Please call.

## 2014-09-25 NOTE — Telephone Encounter (Signed)
Anderson Malta with [facility] is calling in regard to the lab tests on glucose and protein per [place] as they are doing maintenance. Test should be available later this afternoon.  Thanks!

## 2014-09-25 NOTE — Discharge Instructions (Signed)
Lumbar Puncture Discharge Instructions  1. Go home and rest quietly for the next 24 hours.  It is important to lie flat for the next 24 hours.  Get up only to go to the restroom.  You may lie in the bed or on a couch on your back, your stomach, your left side or your right side.  You may have one pillow under your head.  You may have pillows between your knees while you are on your side or under your knees while you are on your back.  2. DO NOT drive today.  Recline the seat as far back as it will go, while still wearing your seat belt, on the way home.  3. You may get up to go to the bathroom as needed.  You may sit up for 10 minutes to eat.  You may resume your normal diet and medications unless otherwise indicated.  Drink plenty of extra fluids today and tomorrow.  4. The incidence of a spinal headache with nausea and/or vomiting is about 5% (one in 20 patients).  If you develop a headache, lie flat and drink plenty of fluids until the headache goes away.  Caffeinated beverages may be helpful.  If you develop a severe headache, or nausea and vomiting, that does not go away with flat bed rest, please call the physician who sent you here.  5. You may resume normal activities after your 24 hours of bed rest is over; however, do not exert yourself strongly or do any heavy lifting tomorrow.  Please call us at [phone] with any questions or concerns.  6. Call your physician for a follow-up appointment.

## 2014-09-26 ENCOUNTER — Other Ambulatory Visit: Payer: Self-pay | Admitting: Neurology

## 2014-09-26 ENCOUNTER — Telehealth: Payer: Self-pay | Admitting: Neurology

## 2014-09-26 DIAGNOSIS — G971 Other reaction to spinal and lumbar puncture: Secondary | ICD-10-CM

## 2014-09-26 LAB — GLUCOSE, CSF: Glucose, CSF: 52 mg/dL (ref 43–76)

## 2014-09-26 LAB — PROTEIN, CSF: Total Protein, CSF: 26 mg/dL (ref 15–45)

## 2014-09-26 MED ORDER — ACETAZOLAMIDE 250 MG PO TABS: 250.0000 mg | ORAL_TABLET | Freq: Two times a day (BID) | ORAL | Status: DC

## 2014-09-26 NOTE — Telephone Encounter (Signed)
Suzanne Long - I called it in to [facility] on [address] a few minutes ago. Thought I did it yesterday, please apologize for me. thanks

## 2014-09-26 NOTE — Telephone Encounter (Signed)
Talked with Dr. Jaynee Eagles and she got the results for the pt from [facility].

## 2014-09-26 NOTE — Telephone Encounter (Signed)
Suzanne Long - We can wait until tomorrow. Please le tthem know. Thank you

## 2014-09-26 NOTE — Telephone Encounter (Signed)
Talked with pt and she said Dr. Jaynee Eagles was going to call in a medication to help with pressure in her eye but does not remember the name. She called her pharmacy but they said nothing was ready to be picked up. I told her I would talk with Dr. Jaynee Eagles and let her know.

## 2014-09-26 NOTE — Telephone Encounter (Signed)
Talked with patient to let her know Dr. Jaynee Eagles called in the Diamox to her pharmacy and its ready to be picked up. Pt verbalized understanding. Pt states she is still having a headache. Advised patient to drink plenty of fluids/drink caffeine and we will follow up with her about whether Dr. Jaynee Eagles would like to do a blood patch. Pt verbalized understanding. I spoke with Dr. Jaynee Eagles and she would like to do the blood patch.

## 2014-09-26 NOTE — Telephone Encounter (Signed)
Patient's husband is calling about pain medication for lumbar test his wife has yesteday. He did not know the name of the Rx that was to be called in.  Please call. Thanks!

## 2014-09-26 NOTE — Telephone Encounter (Signed)
Sent it in to [facility] on [address]. Sorry for that, thanks

## 2014-09-27 ENCOUNTER — Ambulatory Visit
Admission: RE | Admit: 2014-09-27 | Discharge: 2014-09-27 | Disposition: A | Discharge status: Home / Self Care | Source: Ambulatory Visit | Attending: Neurology | Admitting: Neurology

## 2014-09-27 DIAGNOSIS — G971 Other reaction to spinal and lumbar puncture: Secondary | ICD-10-CM

## 2014-09-27 NOTE — Discharge Instructions (Signed)

## 2014-09-28 ENCOUNTER — Ambulatory Visit
Admission: RE | Admit: 2014-09-28 | Discharge: 2014-09-28 | Disposition: A | Discharge status: Home / Self Care | Source: Ambulatory Visit | Attending: Neurology | Admitting: Neurology

## 2014-09-28 MED ORDER — IOHEXOL 180 MG/ML  SOLN
1.0000 mL | Freq: Once | INTRAMUSCULAR | Status: AC | PRN
  Administered 2014-09-28: 1 mL via EPIDURAL

## 2014-09-28 NOTE — Progress Notes (Signed)
20cc blood drawn from left AC space for Epidural Blood Patch; site unremarkable.  Brita Romp, RN

## 2014-11-03 ENCOUNTER — Other Ambulatory Visit: Payer: Self-pay

## 2014-11-03 MED ORDER — FUROSEMIDE 40 MG PO TABS: 40.0000 mg | ORAL_TABLET | Freq: Every day | ORAL | Status: DC

## 2015-05-29 ENCOUNTER — Encounter (HOSPITAL_COMMUNITY): Payer: Self-pay | Admitting: Emergency Medicine

## 2015-05-29 ENCOUNTER — Emergency Department (HOSPITAL_COMMUNITY)

## 2015-05-29 ENCOUNTER — Inpatient Hospital Stay (HOSPITAL_COMMUNITY)
Admission: EM | Admit: 2015-05-29 | Discharge: 2015-05-30 | DRG: 292 | Disposition: A | Discharge status: Home / Self Care | Attending: Internal Medicine | Admitting: Internal Medicine

## 2015-05-29 ENCOUNTER — Other Ambulatory Visit (HOSPITAL_COMMUNITY)

## 2015-05-29 DIAGNOSIS — Z91013 Allergy to seafood: Secondary | ICD-10-CM

## 2015-05-29 DIAGNOSIS — Z9114 Patient's other noncompliance with medication regimen: Secondary | ICD-10-CM

## 2015-05-29 DIAGNOSIS — Z6838 Body mass index (BMI) 38.0-38.9, adult: Secondary | ICD-10-CM | POA: Diagnosis not present

## 2015-05-29 DIAGNOSIS — I248 Other forms of acute ischemic heart disease: Secondary | ICD-10-CM | POA: Diagnosis present

## 2015-05-29 DIAGNOSIS — I5043 Acute on chronic combined systolic (congestive) and diastolic (congestive) heart failure: Secondary | ICD-10-CM | POA: Diagnosis present

## 2015-05-29 DIAGNOSIS — Z91040 Latex allergy status: Secondary | ICD-10-CM

## 2015-05-29 DIAGNOSIS — Z6841 Body Mass Index (BMI) 40.0 and over, adult: Secondary | ICD-10-CM | POA: Diagnosis present

## 2015-05-29 DIAGNOSIS — Z713 Dietary counseling and surveillance: Secondary | ICD-10-CM | POA: Diagnosis not present

## 2015-05-29 DIAGNOSIS — R778 Other specified abnormalities of plasma proteins: Secondary | ICD-10-CM

## 2015-05-29 DIAGNOSIS — I10 Essential (primary) hypertension: Secondary | ICD-10-CM | POA: Diagnosis not present

## 2015-05-29 DIAGNOSIS — R7989 Other specified abnormal findings of blood chemistry: Secondary | ICD-10-CM

## 2015-05-29 DIAGNOSIS — I110 Hypertensive heart disease with heart failure: Secondary | ICD-10-CM | POA: Diagnosis present

## 2015-05-29 DIAGNOSIS — I509 Heart failure, unspecified: Secondary | ICD-10-CM | POA: Diagnosis not present

## 2015-05-29 DIAGNOSIS — E039 Hypothyroidism, unspecified: Secondary | ICD-10-CM | POA: Diagnosis present

## 2015-05-29 DIAGNOSIS — E038 Other specified hypothyroidism: Secondary | ICD-10-CM | POA: Diagnosis present

## 2015-05-29 HISTORY — DX: Heart failure, unspecified: I50.9

## 2015-05-29 HISTORY — DX: Hypothyroidism, unspecified: E03.9

## 2015-05-29 LAB — CBC WITH DIFFERENTIAL/PLATELET
Basophils Absolute: 0 10*3/uL (ref 0.0–0.1)
Basophils Relative: 0 %
Eosinophils Absolute: 0.2 10*3/uL (ref 0.0–0.7)
Eosinophils Relative: 4 %
HCT: 42.4 % (ref 36.0–46.0)
Hemoglobin: 13.8 g/dL (ref 12.0–15.0)
Lymphocytes Relative: 42 %
Lymphs Abs: 2.7 10*3/uL (ref 0.7–4.0)
MCH: 29.3 pg (ref 26.0–34.0)
MCHC: 32.5 g/dL (ref 30.0–36.0)
MCV: 90 fL (ref 78.0–100.0)
Monocytes Absolute: 0.4 10*3/uL (ref 0.1–1.0)
Monocytes Relative: 5 %
Neutro Abs: 3.2 10*3/uL (ref 1.7–7.7)
Neutrophils Relative %: 49 %
Platelets: 300 10*3/uL (ref 150–400)
RBC: 4.71 MIL/uL (ref 3.87–5.11)
RDW: 13.1 % (ref 11.5–15.5)
WBC: 6.5 10*3/uL (ref 4.0–10.5)

## 2015-05-29 LAB — BASIC METABOLIC PANEL
Anion gap: 9 (ref 5–15)
BUN: 12 mg/dL (ref 6–20)
CO2: 27 mmol/L (ref 22–32)
Calcium: 9.2 mg/dL (ref 8.9–10.3)
Chloride: 103 mmol/L (ref 101–111)
Creatinine, Ser: 0.92 mg/dL (ref 0.44–1.00)
GFR calc Af Amer: >60 mL/min (ref >60)
GFR calc non Af Amer: >60 mL/min (ref >60)
Glucose, Bld: 96 mg/dL (ref 65–99)
Potassium: 3.8 mmol/L (ref 3.5–5.1)
Sodium: 139 mmol/L (ref 135–145)

## 2015-05-29 LAB — RAPID URINE DRUG SCREEN, HOSP PERFORMED
Amphetamines: NOT DETECTED
Barbiturates: NOT DETECTED
Benzodiazepines: NOT DETECTED
Cocaine: NOT DETECTED
Opiates: NOT DETECTED
Tetrahydrocannabinol: NOT DETECTED

## 2015-05-29 LAB — HCG, QUANTITATIVE, PREGNANCY: hCG, Beta Chain, Quant, S: <1 m[IU]/mL (ref <5)

## 2015-05-29 LAB — TSH: TSH: 3.346 u[IU]/mL (ref 0.350–4.500)

## 2015-05-29 LAB — TROPONIN I
Troponin I: 0.05 ng/mL — ABNORMAL HIGH (ref <0.031)
Troponin I: 0.06 ng/mL — ABNORMAL HIGH (ref <0.031)
Troponin I: 0.04 ng/mL — ABNORMAL HIGH (ref <0.031)
Troponin I: 0.03 ng/mL (ref <0.031)

## 2015-05-29 LAB — APTT: aPTT: 30 seconds (ref 24–37)

## 2015-05-29 LAB — PROTIME-INR
INR: 1.03 (ref 0.00–1.49)
Prothrombin Time: 13.7 seconds (ref 11.6–15.2)

## 2015-05-29 LAB — BRAIN NATRIURETIC PEPTIDE: B Natriuretic Peptide: 553.6 pg/mL — ABNORMAL HIGH (ref 0.0–100.0)

## 2015-05-29 MED ORDER — FUROSEMIDE 10 MG/ML IJ SOLN
40.0000 mg | Freq: Once | INTRAMUSCULAR | Status: AC
  Administered 2015-05-29: 40 mg via INTRAVENOUS
  Filled 2015-05-29: qty 4

## 2015-05-29 MED ORDER — ACETAMINOPHEN 325 MG PO TABS: 650.0000 mg | ORAL_TABLET | ORAL | Status: DC | PRN

## 2015-05-29 MED ORDER — AMLODIPINE BESYLATE 5 MG PO TABS
5.0000 mg | ORAL_TABLET | Freq: Once | ORAL | Status: AC
  Administered 2015-05-29: 5 mg via ORAL
  Filled 2015-05-29: qty 1

## 2015-05-29 MED ORDER — AMLODIPINE BESYLATE 5 MG PO TABS
5.0000 mg | ORAL_TABLET | Freq: Every day | ORAL | Status: DC
  Administered 2015-05-29: 5 mg via ORAL
  Filled 2015-05-29: qty 1

## 2015-05-29 MED ORDER — CARVEDILOL 25 MG PO TABS
25.0000 mg | ORAL_TABLET | Freq: Two times a day (BID) | ORAL | Status: DC
  Administered 2015-05-29 – 2015-05-30 (×3): 25 mg via ORAL
  Filled 2015-05-29 (×3): qty 1

## 2015-05-29 MED ORDER — LISINOPRIL 40 MG PO TABS
40.0000 mg | ORAL_TABLET | Freq: Every day | ORAL | Status: DC
  Administered 2015-05-29 – 2015-05-30 (×2): 40 mg via ORAL
  Filled 2015-05-29 (×2): qty 1

## 2015-05-29 MED ORDER — FUROSEMIDE 40 MG PO TABS
40.0000 mg | ORAL_TABLET | Freq: Every day | ORAL | Status: DC
  Administered 2015-05-29 – 2015-05-30 (×2): 40 mg via ORAL
  Filled 2015-05-29 (×2): qty 1

## 2015-05-29 MED ORDER — HYDRALAZINE HCL 25 MG PO TABS: 25.0000 mg | ORAL_TABLET | Freq: Three times a day (TID) | ORAL | Status: DC

## 2015-05-29 MED ORDER — LISINOPRIL 20 MG PO TABS
40.0000 mg | ORAL_TABLET | Freq: Once | ORAL | Status: AC
  Administered 2015-05-29: 40 mg via ORAL
  Filled 2015-05-29: qty 2

## 2015-05-29 MED ORDER — FUROSEMIDE 20 MG PO TABS
40.0000 mg | ORAL_TABLET | Freq: Once | ORAL | Status: AC
  Administered 2015-05-29: 40 mg via ORAL
  Filled 2015-05-29: qty 2

## 2015-05-29 MED ORDER — SODIUM CHLORIDE 0.9 % IV SOLN: 250.0000 mL | INTRAVENOUS | Status: DC | PRN

## 2015-05-29 MED ORDER — ASPIRIN 81 MG PO CHEW
81.0000 mg | CHEWABLE_TABLET | Freq: Every day | ORAL | Status: DC
  Administered 2015-05-29 – 2015-05-30 (×2): 81 mg via ORAL
  Filled 2015-05-29 (×2): qty 1

## 2015-05-29 MED ORDER — SODIUM CHLORIDE 0.9 % IJ SOLN
3.0000 mL | Freq: Two times a day (BID) | INTRAMUSCULAR | Status: DC
  Administered 2015-05-29 – 2015-05-30 (×3): 3 mL via INTRAVENOUS

## 2015-05-29 MED ORDER — HEPARIN SODIUM (PORCINE) 5000 UNIT/ML IJ SOLN
5000.0000 [IU] | Freq: Three times a day (TID) | INTRAMUSCULAR | Status: DC
  Administered 2015-05-29: 5000 [IU] via SUBCUTANEOUS
  Filled 2015-05-29 (×2): qty 1

## 2015-05-29 MED ORDER — ASPIRIN 81 MG PO CHEW
324.0000 mg | CHEWABLE_TABLET | Freq: Once | ORAL | Status: AC
  Administered 2015-05-29: 324 mg via ORAL
  Filled 2015-05-29: qty 4

## 2015-05-29 MED ORDER — CARVEDILOL 25 MG PO TABS
25.0000 mg | ORAL_TABLET | Freq: Two times a day (BID) | ORAL | Status: DC
  Administered 2015-05-29: 25 mg via ORAL
  Filled 2015-05-29 (×4): qty 1

## 2015-05-29 MED ORDER — SODIUM CHLORIDE 0.9 % IJ SOLN: 3.0000 mL | INTRAMUSCULAR | Status: DC | PRN

## 2015-05-29 MED ORDER — ONDANSETRON HCL 4 MG/2ML IJ SOLN: 4.0000 mg | Freq: Four times a day (QID) | INTRAMUSCULAR | Status: DC | PRN

## 2015-05-29 NOTE — ED Notes (Signed)
Dr. Glick at bedside.  

## 2015-05-29 NOTE — Progress Notes (Signed)
CCMD called to notify me that patient had 9 beat run of V-tach. Patient resting in bed speaking with Cardiology PA at this time. Patient does not have any complaints at this time. Patient denies chest pain, Shortness of breath and fatigue. Ahmed Prima, PA for Cardiology who is at bedside made aware. Dr. Allyson Sabal also paged and made aware. No new orders at this time. Will continue to monitor patient.

## 2015-05-29 NOTE — ED Provider Notes (Signed)
CSN: [id number]   Arrival date & time 05/29/15 0116  History  By signing my name below, I, Altamease Oiler, attest that this documentation has been prepared under the direction and in the presence of Delora Fuel, MD. Electronically Signed: Altamease Oiler, ED Scribe. 05/29/2015. 2:35 AM.  Chief Complaint  Patient presents with  . Shortness of Breath  . Hypertension    HPI The history is provided by the patient. No language interpreter was used.   Rosamae Rocque is a 44 y.o. female with history of HTN who presents to the Emergency Department complaining of mild and constant SOB with onset 2 weeks ago. The sensation has not changed in severity but is similar to symptoms that she had in the past with HTN.  Pt denies headache, chest pain, nausea, sweating. She has not been using her medication (Carvedilol 25 mg BID filled in December, Lisinopril 40 mg filled in April, amlodipine 5 mg filled in April, and furosemide 40 mg filled in April) over the last month or measuring her blood pressure at home. Her primary care is with Dr. Harle Battiest and her cardiologist is Dr. Tamala Julian.    Past Medical History  Diagnosis Date  . Hypertension     Past Surgical History  Procedure Laterality Date  . None      Family History  Problem Relation Age of Onset  . Migraines Neg Hx   . Multiple sclerosis Neg Hx     Social History  Substance Use Topics  . Smoking status: Never Smoker   . Smokeless tobacco: Never Used  . Alcohol Use: 0.0 oz/week    0 Standard drinks or equivalent per week     Comment: Occ     Review of Systems  Respiratory: Positive for shortness of breath.   Cardiovascular: Negative for chest pain.  Gastrointestinal: Negative for nausea and vomiting.  Neurological: Negative for headaches.  All other systems reviewed and are negative.  Home Medications   Prior to Admission medications   Medication Sig Start Date End Date Taking? Authorizing Provider  acetaZOLAMIDE (DIAMOX) 250 MG  tablet Take 1 tablet (250 mg total) by mouth 2 (two) times daily. 09/26/14   Melvenia Beam, MD  amLODipine (NORVASC) 5 MG tablet take 1 tablet by mouth once daily    Belva Crome, MD  carvedilol (COREG) 25 MG tablet take 1 tablet by mouth twice a day with food 09/29/13   Belva Crome, MD  furosemide (LASIX) 40 MG tablet Take 1 tablet (40 mg total) by mouth daily. 11/03/14   Belva Crome, MD  lisinopril (PRINIVIL,ZESTRIL) 40 MG tablet Take 1 tablet (40 mg total) by mouth daily. 06/27/14   Belva Crome, MD    Allergies  Shellfish allergy; Gadolinium derivatives; and Latex  Triage Vitals: BP 175/119 mmHg  Pulse 74  Temp(Src) 97.8 F (36.6 C) (Oral)  Resp 16  Ht $R'5\' 8"'oc$  (1.727 m)  Wt 254 lb (115.214 kg)  BMI 38.63 kg/m2  SpO2 100%  LMP 05/22/2015 (Approximate)  Physical Exam  Constitutional: She is oriented to person, place, and time. She appears well-developed and well-nourished. No distress.  HENT:  Head: Normocephalic and atraumatic.  Eyes: EOM are normal. Pupils are equal, round, and reactive to light.  Neck: Normal range of motion. Neck supple. No JVD present.  Cardiovascular: Normal rate, regular rhythm and normal heart sounds.   No murmur heard. Pulmonary/Chest: Effort normal and breath sounds normal. She has no wheezes. She has no rales. She exhibits  no tenderness.  Abdominal: Soft. Bowel sounds are normal. She exhibits no distension and no mass. There is no tenderness.  Musculoskeletal: Normal range of motion. She exhibits no edema.  1+ pitting edema  Lymphadenopathy:    She has no cervical adenopathy.  Neurological: She is alert and oriented to person, place, and time. No cranial nerve deficit. She exhibits normal muscle tone. Coordination normal.  Skin: Skin is warm and dry. No rash noted.  Psychiatric: She has a normal mood and affect. Her behavior is normal. Judgment and thought content normal.  Nursing note and vitals reviewed.   ED Course   Procedures   DIAGNOSTIC STUDIES: Oxygen Saturation is 100% on RA, normal by my interpretation.    COORDINATION OF CARE: 2:33 AM Discussed treatment plan which includes CXR, EKG, lab work, and the first dose of her home medications with pt at bedside and pt agreed to plan.  Labs Reviewed  CBC WITH DIFFERENTIAL/PLATELET  BASIC METABOLIC PANEL  BRAIN NATRIURETIC PEPTIDE  TROPONIN I    Imaging Review Dg Chest 2 View  05/29/2015  CLINICAL DATA:  Acute onset of shortness of breath. Initial encounter. EXAM: CHEST  2 VIEW COMPARISON:  Chest radiograph performed 06/01/2010 FINDINGS: The lungs are well-aerated. Mild vascular congestion is noted. Peribronchial thickening is seen. There is no evidence of focal opacification, pleural effusion or pneumothorax. The heart is borderline enlarged. No acute osseous abnormalities are seen. IMPRESSION: Mild vascular congestion and borderline cardiomegaly. Peribronchial thickening noted. Electronically Signed   By: Garald Balding M.D.   On: 05/29/2015 01:45    I personally reviewed and evaluated these images and lab results as a part of my medical decision-making.   EKG Interpretation   Date/Time:  Tuesday May 29 2015 01:31:15 EST Ventricular Rate:  101 PR Interval:  146 QRS Duration: 92 QT Interval:  368 QTC Calculation: 477 R Axis:   88 Text Interpretation:  Sinus tachycardia Possible Left atrial enlargement  Borderline ECG When compared with ECG of 06/03/2010, No significant change  was found Confirmed by [facility]  MD, Djuan Talton ([id number]) on 05/29/2015 1:38:15 AM      CRITICAL CARE Performed by: BMWUX,LKGMW Total critical care time: 40 minutes Critical care time was exclusive of separately billable procedures and treating other patients. Critical care was necessary to treat or prevent imminent or life-threatening deterioration. Critical care was time spent personally by me on the following activities: development of treatment plan with patient  and/or surrogate as well as nursing, discussions with consultants, evaluation of patient's response to treatment, examination of patient, obtaining history from patient or surrogate, ordering and performing treatments and interventions, ordering and review of laboratory studies, ordering and review of radiographic studies, pulse oximetry and re-evaluation of patient's condition.   MDM   Final diagnoses:  Accelerated hypertension  Acute on chronic congestive heart failure, unspecified congestive heart failure type (HCC)  Elevated troponin  Noncompliance with medication regimen    Poorly controlled hypertension secondary to medication noncompliance. Patient presented me with bottles of medications which have been filled in April and one that had been filled last December. All were for one-month supply and 3 the bottle still had medication them. Several had refills available of which the patient did not avail herself. Dyspnea is consistent with CHF and she does have evidence of peripheral edema. Chest x-ray is consistent with congestive heart failure. Laboratory workup shows mildly elevated BNP consistent with congestive heart failure. Troponin is also mildly elevated. Elevated troponin is of uncertain significance. However,  because of elevated troponin, it was decided to observe her in the hospital and obtain serial troponins. She is given a dose of her medications the ED. Aspirin is given because of elevated troponin. Case is discussed with Dr. Blaine Hamper [facility] who agrees to admit the patient.  I personally performed the services described in this documentation, which was scribed in my presence. The recorded information has been reviewed and is accurate.      Delora Fuel, MD 44/17/12 [id number]

## 2015-05-29 NOTE — H&P (Signed)
[facility] History and Physical  Suzanne Long EHU:[id number] DOB: 28-Jun-1971 DOA: 05/29/2015  Referring physician: ED physician PCP: REDMON,NOELLE, PA-C  Specialists:   Chief Complaint: Shortness of breath  HPI: Suzanne Long is a 44 y.o. 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  Patient reports that she has not been taking her medications consistently. Her last dose of Lasix was on Sunday. She states that she takes her medications intermittently since April of this year. In the past 2 weeks, she has been having shortness of breath, but no chest pain, fever, chills, cough. Patient does not have abdominal pain, diarrhea, symptoms of UTI. No unilateral weakness.  In ED, patient was found to have BNP 553.6, troponin 0.05, blood pressure 185/138, tachycardia, temperature normal, WBC 6.5, electrolytes okay. Renal function okay. Chest x-ray showed mild vascular congestion. Patient is admitted for further eval and treatment.  Where does patient live?   At home    Can patient participate in ADLs?  Yes  Review of Systems:   General: no fevers, chills, no changes in body weight, has poor appetite, has fatigue HEENT: no blurry vision, hearing changes or sore throat Pulm: has dyspnea, no coughing, wheezing CV: no chest pain, palpitations Abd: no nausea, vomiting, abdominal pain, diarrhea, constipation GU: no dysuria, burning on urination, increased urinary frequency, hematuria  Ext: has trace leg edema Neuro: no unilateral weakness, numbness, or tingling, no vision change or hearing loss Skin: no rash MSK: No muscle spasm, no deformity, no limitation of range of movement in spin Heme: No easy bruising.  Travel history: No recent long distant travel.  Allergy:  Allergies  Allergen Reactions  . Shellfish Allergy Anaphylaxis    Uncoded  Allergy. Allergen: SHELLFISH  . Gadolinium Derivatives Nausea And Vomiting    Vomiting after 20cc multihance injection  . Latex Rash    Past Medical History  Diagnosis Date  . Hypertension     Past Surgical History  Procedure Laterality Date  . None      Social History:  reports that she has never smoked. She has never used smokeless tobacco. She reports that she drinks alcohol. She reports that she does not use illicit drugs.  Family History:  Family History  Problem Relation Age of Onset  . Migraines Neg Hx   . Multiple sclerosis Neg Hx      Prior to Admission medications   Medication Sig Start Date End Date Taking? Authorizing Provider  amLODipine (NORVASC) 5 MG tablet take 1 tablet by mouth once daily   Yes Belva Crome, MD  carvedilol (COREG) 25 MG tablet take 1 tablet by mouth twice a day with food 09/29/13  Yes Belva Crome, MD  furosemide (LASIX) 40 MG tablet Take 1 tablet (40 mg total) by mouth daily. 11/03/14  Yes Belva Crome, MD  lisinopril (PRINIVIL,ZESTRIL) 40 MG tablet Take 1 tablet (40 mg total) by mouth daily. 06/27/14  Yes Belva Crome, MD    Physical Exam: Filed Vitals:   05/29/15 0315 05/29/15 0338 05/29/15 0400 05/29/15 0430  BP: 167/120 174/126 164/118 155/117  Pulse: 87 98 83 82  Temp:      TempSrc:      Resp:      Height:      Weight:      SpO2: 97% 100% 98% 99%   General: Not in acute distress HEENT:  Eyes: PERRL, EOMI, no scleral icterus.       ENT: No discharge from the ears and nose, no pharynx injection, no tonsillar enlargement.        Neck: Positive JVD, no bruit, no mass felt. Heme: No neck lymph node enlargement. Cardiac: S1/S2, RRR, No murmurs, No gallops or rubs. Pulm: No rales, wheezing, rhonchi or rubs. Abd: Soft, nondistended, nontender, no rebound pain, no organomegaly, BS present. Ext: Mild leg edema bilaterally. 2+DP/PT pulse bilaterally. Musculoskeletal: No joint deformities, No joint redness or warmth, no  limitation of ROM in spin. Skin: No rashes.  Neuro: Alert, oriented X3, cranial nerves II-XII grossly intact, muscle strength 5/5 in all extremities,  Psych: Patient is not psychotic, no suicidal or hemocidal ideation.  Labs on Admission:  Basic Metabolic Panel:  Recent Labs Lab 05/29/15 0131  NA 139  K 3.8  CL 103  CO2 27  GLUCOSE 96  BUN 12  CREATININE 0.92  CALCIUM 9.2   Liver Function Tests: No results for input(s): AST, ALT, ALKPHOS, BILITOT, PROT, ALBUMIN in the last 168 hours. No results for input(s): LIPASE, AMYLASE in the last 168 hours. No results for input(s): AMMONIA in the last 168 hours. CBC:  Recent Labs Lab 05/29/15 0131  WBC 6.5  NEUTROABS 3.2  HGB 13.8  HCT 42.4  MCV 90.0  PLT 300   Cardiac Enzymes:  Recent Labs Lab 05/29/15 0131  TROPONINI 0.05*    BNP (last 3 results)  Recent Labs  05/29/15 0131  BNP 553.6*    ProBNP (last 3 results) No results for input(s): PROBNP in the last 8760 hours.  CBG: No results for input(s): GLUCAP in the last 168 hours.  Radiological Exams on Admission: Dg Chest 2 View  05/29/2015  CLINICAL DATA:  Acute onset of shortness of breath. Initial encounter. EXAM: CHEST  2 VIEW COMPARISON:  Chest radiograph performed 06/01/2010 FINDINGS: The lungs are well-aerated. Mild vascular congestion is noted. Peribronchial thickening is seen. There is no evidence of focal opacification, pleural effusion or pneumothorax. The heart is borderline enlarged. No acute osseous abnormalities are seen. IMPRESSION: Mild vascular congestion and borderline cardiomegaly. Peribronchial thickening noted. Electronically Signed   By: Garald Balding M.D.   On: 05/29/2015 01:45    EKG: Independently reviewed.  QTC 477, LVE, mild T-wave inversion in V5-V6 and poor R-wave progression    Assessment/Plan Principal Problem:   Acute on chronic combined systolic and diastolic congestive heart failure (HCC) Active Problems:   Essential  hypertension   Morbid obesity (HCC)   Hypothyroidism   Elevated troponin   CHF exacerbation (HCC)  Acute on chronic combined systolic and diastolic congestive heart failure [facility]): Patient has only mild leg edema, but she has positive JVD and elevated BNP, consistent with exacerbation. Previous 2-D echo 06/01/10 showed EF 25-30 percent. Patient has not been compliant to her medications.  -will admit to tele bed -give Lasix 40 mg IV x 1 and resume her home does of oral lasix 40 mg daily. -trop x 3 -2d echo -Continue Coreg, lisinopril - start aspirin  -Daily weights -strict I/O's -Low salt diet -UDS and preg test  Hypertension: Blood pressure was 185/138 on admission. Elevated blood pressure is due to medication noncompliance. -Did counseling about importance of taking medications consistently -Resume medications: Coreg, lisinopril and amlodipine  Hypothyroidism: Not on medications at home. TSH was 0.8-2 on 07/27/14  -Check TSH   Elevated troponin: Troponin 0.05. No chest pain. Likely due to demanding ischemic secondary to congestive  heart failure exacerbation and elevated blood pressure  -Troponin 3  -Follow-up 2-D echo  -ASA   DVT ppx: SQ Heparin          Code Status: Full code Family Communication: None at bed side.        Disposition Plan: Admit to inpatient   Date of Service 05/29/2015    Ivor Costa [facility] Pager [phone]  If 7PM-7AM, please contact night-coverage [email or web address] Password [email or web address] 05/29/2015, 4:50 AM

## 2015-05-29 NOTE — Progress Notes (Signed)
Admitted pt from ED AAOx4. Oriented to room and call bell. VS taken and recorded. BP elevated , pt asymptomatic. Tele on. NSR.HR at 70's. Continued to monitor.

## 2015-05-29 NOTE — ED Notes (Signed)
MD at bedside. 

## 2015-05-29 NOTE — Progress Notes (Addendum)
Patient seen and examined  Admitted last night for shortness of breath thought to be secondary to CHF exacerbation  2-D echo pending, hypertensive overnight, troponin mildly abnormal 0.06, TSH 3.34, UDS negative  We'll consult cardiology , noncomplaint with meds for 2 months   Dc norvasc as BP soft

## 2015-05-29 NOTE — ED Notes (Signed)
Pt. reports SOB for 2 weeks worse with exertion , also stated elevated blood pressure due to no antihypertensive medications for several months .

## 2015-05-29 NOTE — Consult Note (Signed)
CARDIOLOGY CONSULT NOTE   Patient ID: Suzanne Long MRN: [id number], DOB/AGE: Nov 12, 1970   Admit date: 05/29/2015 Date of Consult: 05/29/2015 Reason for Consult: CHF Exacerbation   Primary Physician: Cleda Mccreedy Primary Cardiologist: Dr. Tamala Julian  HPI: Suzanne Long is a 44 y.o. female with past medical history of chronic combined systolic and diastolic CHF (EF 77-82% in 2011), HTN, Hypothyroidism, and medication noncompliance who presented to [facility] ED on 05/29/2015 for worsening dyspnea over the past two weeks  She reports taking her medications (Lisinopril, Carvedilol, Lasix, and Amlodipine) intermittently since April of 2016. She would take them 1-2 times a week. When asked about this she reports "someone my age should not be on so many medications". She reports going to the gym on a daily basis for the past few years and did not have any difficulty with activity until two weeks ago. When doing aerobic activities she noticed more shortness of breath. She did take a few doses of Lasix last week but ran out on 05/27/2015, which prompted her to come to the ED.   She denies any other symptoms such as chest pain, nausea, vomiting, diaphoresis, lower extremity edema, PND, or orthopnea. She does note her dyspnea was present at rest and with exertion.  While admitted, her BNP was elevated to 553.6. Cyclic troponin values peaked at 0.06. TSH normal at 3.346. CXR showed mild vascular congestion and borderline cardiomegaly. She was given one dose of Lasix $Remove'40mg'dcwcCTy$  IV overnight and has since been started back on her home dose of Lasix $Remove'40mg'yDlNvNX$  PO daily. At the time of examination, she reports her shortness of breath has resolved. She denies any other symptoms at this time. Unsure of her baseline weight.  Her last echocardiogram was in 2011 and showed an EF of 25-30%. RHC and LHC were performed at that time showing normal coronary arteries. She was last seen by Dr. Tamala Julian in 01/2014 and reported being  noncompliant with her Synthroid at that time. She did report compliance with her cardiovascular medications. Denied any anginal symptoms at that time.   Problem List Past Medical History  Diagnosis Date  . Hypertension   . CHF (congestive heart failure) ([place])   . Hypothyroidism     Past Surgical History  Procedure Laterality Date  . None       Allergies Allergies  Allergen Reactions  . Shellfish Allergy Anaphylaxis    Uncoded Allergy. Allergen: SHELLFISH  . Gadolinium Derivatives Nausea And Vomiting    Vomiting after 20cc multihance injection  . Latex Rash      Inpatient Medications . aspirin  81 mg Oral Daily  . carvedilol  25 mg Oral BID WC  . furosemide  40 mg Oral Daily  . heparin  5,000 Units Subcutaneous 3 times per day  . lisinopril  40 mg Oral Daily  . sodium chloride  3 mL Intravenous Q12H    Family History Family History  Problem Relation Age of Onset  . Migraines Neg Hx   . Multiple sclerosis Neg Hx      Social History Social History   Social History  . Marital Status: Married    Spouse Name: [name]  . Number of Children: 2  . Years of Education: 12   Occupational History  . Not on file.   Social History Main Topics  . Smoking status: Never Smoker   . Smokeless tobacco: Never Used  . Alcohol Use: 0.0 oz/week    0 Standard drinks or equivalent per week  Comment: Occ  . Drug Use: No  . Sexual Activity: Not on file   Other Topics Concern  . Not on file   Social History Narrative   Patient is married husband name [name]    Patient has 2 children. Both have past away.    Patient is right handed.    Patient is unemployed    Patient has a high school education         Review of Systems General:  No chills, fever, night sweats or weight changes.  Cardiovascular:  No chest pain,  edema, orthopnea, palpitations, paroxysmal nocturnal dyspnea. Positive for dyspnea on exertion. Dermatological: No rash, lesions/masses Respiratory: No cough,  Positive for dyspnea Urologic: No hematuria, dysuria Abdominal:   No nausea, vomiting, diarrhea, bright red blood per rectum, melena, or hematemesis Neurologic:  No visual changes, wkns, changes in mental status. All other systems reviewed and are otherwise negative except as noted above.  Physical Exam Blood pressure 108/67, pulse 82, temperature 97.8 F (36.6 C), temperature source Oral, resp. rate 18, height $RemoveBe'5\' 8"'PNtmmeTEC$  (1.727 m), weight 249 lb 9.6 oz (113.218 kg), last menstrual period 05/22/2015, SpO2 99 %.  General: Pleasant, African American female in NAD Psych: Normal affect. Neuro: Alert and oriented X 3. Moves all extremities spontaneously. HEENT: Normal  Neck: Supple without bruits. JVD mildy elevated. Lungs:  Resp regular and unlabored, CTA without wheezing or rales. Heart: RRR no s3, s4, or murmurs. Abdomen: Soft, non-tender, non-distended, BS + x 4.  Extremities: No clubbing, cyanosis or edema. DP/PT/Radials 2+ and equal bilaterally.  Labs   Recent Labs  05/29/15 0131 05/29/15 0515 05/29/15 0909  TROPONINI 0.05* 0.06* 0.04*   Lab Results  Component Value Date   WBC 6.5 05/29/2015   HGB 13.8 05/29/2015   HCT 42.4 05/29/2015   MCV 90.0 05/29/2015   PLT 300 05/29/2015     Recent Labs Lab 05/29/15 0131  NA 139  K 3.8  CL 103  CO2 27  BUN 12  CREATININE 0.92  CALCIUM 9.2  GLUCOSE 96   Radiology/Studies Dg Chest 2 View: 05/29/2015  CLINICAL DATA:  Acute onset of shortness of breath. Initial encounter. EXAM: CHEST  2 VIEW COMPARISON:  Chest radiograph performed 06/01/2010 FINDINGS: The lungs are well-aerated. Mild vascular congestion is noted. Peribronchial thickening is seen. There is no evidence of focal opacification, pleural effusion or pneumothorax. The heart is borderline enlarged. No acute osseous abnormalities are seen. IMPRESSION: Mild vascular congestion and borderline cardiomegaly. Peribronchial thickening noted. Electronically Signed   By: Garald Balding M.D.   On: 05/29/2015 01:45   ECG: NSR, rate 101, no acute ST or T wave changes.  ECHOCARDIOGRAM: 05/2010   ASSESSMENT AND PLAN  1. Acute on Chronic Combined Systolic and Diastolic CHF - EF was 16-10% in 2011. Cath was performed at that time showing normal coronary arteries. - BNP elevated to 553.6 at times of admission. - New echocardiogram is pending. - Continue to obtain strict I&O's and daily weights. - Had -1.5L output with administration of PO Lasix $Remove'40mg'ksoFGhN$  and IV Lasix $Remove'40mg'cOgIiii$ . Continue Lasix $RemoveBefore'40mg'EfGpsqzneGECl$  PO daily. - Continue BB and ACE-I. The importance of medication compliance with her low EF was discussed in detail with the patient.  2. HTN  - BP has been 108/67 - 185/138 in the past 24 hours. - Carvedilol $RemoveBefo'25mg'lTclmngAKfE$  BID and Lisinopril $RemoveBefore'40mg'lgLyRMyqkEYlV$  daily have been resumed.  3. Elevated Troponin - cyclic troponin values have been 0.05, 0.06, and 0.04 - without chest pain or  anginal equivalents. - EKG without acute ischemic changes - likely secondary to demand ischemic in setting of acute CHF.   Signed, Erma Heritage, PA-C 05/29/2015, 12:08 PM Pager: [phone]  I have examined the patient and reviewed assessment and plan and discussed with patient.  Agree with above as stated.  She has been under a lot of stress with the death of her son.  She admits to not taking her medicines and knows she needs to do this.  Resume home meds.  Would not pursue ischemia workup at this time.  F/u with Dr. Daneen Schick.   Denim Kalmbach S.

## 2015-05-30 ENCOUNTER — Ambulatory Visit (HOSPITAL_COMMUNITY)

## 2015-05-30 ENCOUNTER — Telehealth: Payer: Self-pay | Admitting: Physician Assistant

## 2015-05-30 DIAGNOSIS — I509 Heart failure, unspecified: Secondary | ICD-10-CM

## 2015-05-30 LAB — COMPREHENSIVE METABOLIC PANEL
ALT: 31 U/L (ref 14–54)
AST: 27 U/L (ref 15–41)
Albumin: 3.2 g/dL — ABNORMAL LOW (ref 3.5–5.0)
Alkaline Phosphatase: 74 U/L (ref 38–126)
Anion gap: 8 (ref 5–15)
BUN: 13 mg/dL (ref 6–20)
CO2: 30 mmol/L (ref 22–32)
Calcium: 9.2 mg/dL (ref 8.9–10.3)
Chloride: 100 mmol/L — ABNORMAL LOW (ref 101–111)
Creatinine, Ser: 0.87 mg/dL (ref 0.44–1.00)
GFR calc Af Amer: >60 mL/min (ref >60)
GFR calc non Af Amer: >60 mL/min (ref >60)
Glucose, Bld: 95 mg/dL (ref 65–99)
Potassium: 3.5 mmol/L (ref 3.5–5.1)
Sodium: 138 mmol/L (ref 135–145)
Total Bilirubin: 0.5 mg/dL (ref 0.3–1.2)
Total Protein: 6.4 g/dL — ABNORMAL LOW (ref 6.5–8.1)

## 2015-05-30 LAB — CBC
HCT: 40.7 % (ref 36.0–46.0)
Hemoglobin: 13.5 g/dL (ref 12.0–15.0)
MCH: 29.7 pg (ref 26.0–34.0)
MCHC: 33.2 g/dL (ref 30.0–36.0)
MCV: 89.5 fL (ref 78.0–100.0)
Platelets: 282 10*3/uL (ref 150–400)
RBC: 4.55 MIL/uL (ref 3.87–5.11)
RDW: 13.1 % (ref 11.5–15.5)
WBC: 6.6 10*3/uL (ref 4.0–10.5)

## 2015-05-30 MED ORDER — FUROSEMIDE 40 MG PO TABS: 40.0000 mg | ORAL_TABLET | Freq: Every day | ORAL | Status: DC

## 2015-05-30 MED ORDER — ASPIRIN 81 MG PO CHEW: 81.0000 mg | CHEWABLE_TABLET | Freq: Every day | ORAL | Status: DC

## 2015-05-30 MED ORDER — CARVEDILOL 25 MG PO TABS: 25.0000 mg | ORAL_TABLET | Freq: Two times a day (BID) | ORAL | Status: DC

## 2015-05-30 MED ORDER — LISINOPRIL 40 MG PO TABS: 40.0000 mg | ORAL_TABLET | Freq: Every day | ORAL | Status: DC

## 2015-05-30 NOTE — Progress Notes (Signed)
Heart Failure Navigator Consult Note  Presentation: Suzanne Long is a 44 y.o. 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  Patient reports that she has not been taking her medications consistently. Her last dose of Lasix was on Sunday. She states that she takes her medications intermittently since April of this year. In the past 2 weeks, she has been having shortness of breath, but no chest pain, fever, chills, cough. Patient does not have abdominal pain, diarrhea, symptoms of UTI. No unilateral weakness.  In ED, patient was found to have BNP 553.6, troponin 0.05, blood pressure 185/138, tachycardia, temperature normal, WBC 6.5, electrolytes okay. Renal function okay. Chest x-ray showed mild vascular congestion.   Past Medical History  Diagnosis Date  . Hypertension   . CHF (congestive heart failure) ([place])   . Hypothyroidism     Social History   Social History  . Marital Status: Married    Spouse Name: [name]  . Number of Children: 2  . Years of Education: 12   Social History Main Topics  . Smoking status: Never Smoker   . Smokeless tobacco: Never Used  . Alcohol Use: 0.0 oz/week    0 Standard drinks or equivalent per week     Comment: Occ  . Drug Use: No  . Sexual Activity: Not Asked   Other Topics Concern  . None   Social History Narrative   Patient is married husband name Danny    Patient has 2 children. Both have past away.    Patient is right handed.    Patient is unemployed    Patient has a high school education        ECHO:Study Conclusions  - Left ventricle: Diffuse hypokinesis worse in the apex The cavity  size was moderately dilated. Wall thickness was increased in a  pattern of mild LVH. Systolic function was severely reduced. The  estimated ejection fraction was in the range of 25% to 30%.  Diffuse  hypokinesis. - Mitral valve: Mild regurgitation. - Left atrium: The atrium was mildly dilated. Transthoracic echocardiography. M-mode, complete 2D, spectral Doppler, and color Doppler. Height: Height: 172.7cm. Height: 68in. Weight: Weight: 107kg. Weight: 235.4lb. Body mass index: BMI: 35.9kg/m^2. Body surface area: BSA: 2.5m^2. Blood pressure: 158/103. Patient status: Inpatient. Location: Bedside.  BNP    Component Value Date/Time   BNP 553.6* 05/29/2015 0131    ProBNP    Component Value Date/Time   PROBNP 273.0* 06/04/2010 1010     Education Assessment and Provision:  Detailed education and instructions provided on heart failure disease management including the following:  Signs and symptoms of Heart Failure When to call the physician Importance of daily weights Low sodium diet Fluid restriction Medication management Anticipated future follow-up appointments  Patient education given on each of the above topics.  Patient acknowledges understanding and acceptance of all instructions.  I spoke briefly with Ms. Aida Puffer regarding her HF.  She tells me that she is familiar with HF recommendations and has been educated before.  She has not been weighing at home and does have a scale.  She can teach back the importance of daily weights and how they relate to the signs and symptoms of HF.  I have encouraged her to weigh daily going forward.  She admits to not taking her prescribed medications at home for no particular reason--she says she was "just not taking them".  I reinforced the importance  if taking her medications.  I briefly reviewed a low sodium diet and high sodium foods to avoid.  She follows with Dr. Daneen Schick as an outpatient.  I have encouraged her to call me with questions or concerns related to her HF after discharge if needed.  Education Materials:  "Living Better With Heart Failure" Booklet, Daily Weight Tracker Tool    High Risk Criteria for Readmission and/or  Poor Patient Outcomes:   EF <30%- Yes 25-30%  2 or more admissions in 6 months- No  Difficult social situation- No  Demonstrates medication noncompliance- Yes--admits to not taking medications for no identifiable reason    Barriers of Care:  Knowledge and compliance  Discharge Planning:   Plans to discharge to home with husband

## 2015-05-30 NOTE — Progress Notes (Signed)
Nutrition Brief Note  Patient identified on the Malnutrition Screening Tool (MST) Report  Wt Readings from Last 15 Encounters:  05/30/15 254 lb 10.1 oz (115.5 kg)  09/19/14 240 lb (108.863 kg)  07/27/14 235 lb 6.4 oz (106.777 kg)  02/09/14 238 lb 1.9 oz (108.011 kg)    Body mass index is 38.73 kg/(m^2). Patient meets criteria for Obesity based on current BMI.   Current diet order is 2 Gram Sodium, patient is consuming approximately 100% of meals at this time. Labs and medications reviewed.   MST score filed inaccurately. Pt has not had any unintentional weight loss and is eating well. No nutrition interventions warranted at this time. If nutrition issues arise, please consult RD.   Suzanne Long RD, LDN Inpatient Clinical Dietitian Pager: [phone] After Hours Pager: [phone]

## 2015-05-30 NOTE — Progress Notes (Signed)
  Echocardiogram 2D Echocardiogram has been performed.  Suzanne Long 05/30/2015, 12:21 PM

## 2015-05-30 NOTE — Telephone Encounter (Signed)
New problem    Pt has TCM w/Scott 11.28.16 per [facility] PA calling.

## 2015-05-30 NOTE — Progress Notes (Signed)
Patient Name: Suzanne Long Date of Encounter: 05/30/2015     Principal Problem:   Acute on chronic combined systolic and diastolic congestive heart failure ([place]) Active Problems:   Essential hypertension   Morbid obesity ([facility])   Hypothyroidism   Elevated troponin   CHF exacerbation ([facility])    SUBJECTIVE  Feeling much better wants to go home.   CURRENT MEDS . aspirin  81 mg Oral Daily  . carvedilol  25 mg Oral BID WC  . furosemide  40 mg Oral Daily  . heparin  5,000 Units Subcutaneous 3 times per day  . lisinopril  40 mg Oral Daily  . sodium chloride  3 mL Intravenous Q12H    OBJECTIVE  Filed Vitals:   05/29/15 2052 05/30/15 0011 05/30/15 0443 05/30/15 0827  BP: 109/69 117/72 124/77 128/81  Pulse: 62 60 57 64  Temp: 98.2 F (36.8 C) 98.1 F (36.7 C) 97.9 F (36.6 C)   TempSrc: Oral Oral Oral   Resp: $Remo'18 18 18   'TdcZF$ Height:      Weight:   254 lb 10.1 oz (115.5 kg)   SpO2: 99% 97% 98%     Intake/Output Summary (Last 24 hours) at 05/30/15 1034 Last data filed at 05/30/15 0830  Gross per 24 hour  Intake    360 ml  Output   1425 ml  Net  -1065 ml   Filed Weights   05/29/15 0124 05/29/15 0501 05/30/15 0443  Weight: 254 lb (115.214 kg) 249 lb 9.6 oz (113.218 kg) 254 lb 10.1 oz (115.5 kg)    PHYSICAL EXAM  General: Pleasant, NAD. Neuro: Alert and oriented X 3. Moves all extremities spontaneously. Psych: Normal affect. HEENT:  Normal  Neck: Supple without bruits or JVD. Lungs:  Resp regular and unlabored, CTA. Heart: RRR no s3, s4, or murmurs. Abdomen: Soft, non-tender, non-distended, BS + x 4.  Extremities: No clubbing, cyanosis or edema. DP/PT/Radials 2+ and equal bilaterally.  Accessory Clinical Findings  CBC  Recent Labs  05/29/15 0131 05/30/15 0220  WBC 6.5 6.6  NEUTROABS 3.2  --   HGB 13.8 13.5  HCT 42.4 40.7  MCV 90.0 89.5  PLT 300 811   Basic Metabolic Panel  Recent Labs  05/29/15 0131 05/30/15 0220  NA 139 138  K 3.8 3.5  CL 103  100*  CO2 27 30  GLUCOSE 96 95  BUN 12 13  CREATININE 0.92 0.87  CALCIUM 9.2 9.2   Liver Function Tests  Recent Labs  05/30/15 0220  AST 27  ALT 31  ALKPHOS 74  BILITOT 0.5  PROT 6.4*  ALBUMIN 3.2*   No results for input(s): LIPASE, AMYLASE in the last 72 hours. Cardiac Enzymes  Recent Labs  05/29/15 0515 05/29/15 0909 05/29/15 1604  TROPONINI 0.06* 0.04* 0.03   Thyroid Function Tests  Recent Labs  05/29/15 0515  TSH 3.346     Radiology/Studies  Dg Chest 2 View  05/29/2015  CLINICAL DATA:  Acute onset of shortness of breath. Initial encounter. EXAM: CHEST  2 VIEW COMPARISON:  Chest radiograph performed 06/01/2010 FINDINGS: The lungs are well-aerated. Mild vascular congestion is noted. Peribronchial thickening is seen. There is no evidence of focal opacification, pleural effusion or pneumothorax. The heart is borderline enlarged. No acute osseous abnormalities are seen. IMPRESSION: Mild vascular congestion and borderline cardiomegaly. Peribronchial thickening noted. Electronically Signed   By: Garald Balding M.D.   On: 05/29/2015 01:45    ASSESSMENT AND PLAN  Suzanne Long is a 44  y.o. female with past medical history of chronic combined systolic and diastolic CHF (EF 64-68% in 2011), HTN, Hypothyroidism, and medication noncompliance who presented to [facility] ED on 05/29/2015 for worsening dyspnea over the past two weeks ( had not been taking her meds)  1. Acute on Chronic Combined Systolic and Diastolic CHF - EF was 03-21% in 2011. Cath was performed at that time showing normal coronary arteries. - BNP elevated to 553.6 at times of admission. - New echocardiogram is pending. - Net neg  -1.8L output with administration of PO Lasix $Remove'40mg'LrDkKaR$  and IV Lasix $Remove'40mg'XGBzxoL$ . Continue Lasix $RemoveBefore'40mg'srFGmMHyAJWve$  PO daily. - Continue BB and ACE-I. The importance of medication compliance with her low EF was discussed in detail with the patient. - patient may need ICD if EF remains low. Will get an  ECHO before she leaves today.   2. HTN  - BP better controlled now that Carvedilol $RemoveBeforeD'25mg'zaqDmoysjPLeUd$  BID and Lisinopril $RemoveBefore'40mg'yqiYkjWlmGPsI$  daily have been resumed.  3. Elevated Troponin - cyclic troponin values have been 0.05, 0.06, and 0.04 - without chest pain or anginal equivalents. - EKG without acute ischemic changes - likely secondary to demand ischemic in setting of acute CHF.  Dispo- will have her follow up in the clinic in 1-2 week for TOC visit.    SignedEileen Stanford PA-C  Pager [phone]  I have examined the patient and reviewed assessment and plan and discussed with patient.  Agree with above as stated.  Appears well compensated. Check echo. She will resume her meds.  SHe understands the importance of taking her meds.  F/u with Dr. Tamala Julian.  Abagale Boulos S.  Marland Kitchen

## 2015-05-30 NOTE — Discharge Summary (Signed)
Physician Discharge Summary  Suzanne Long MRN: [id number] DOB/AGE: [birth year] 44 y.o.  PCP: REDMON,NOELLE, PA-C   Admit date: 05/29/2015 Discharge date: 05/30/2015  Discharge Diagnoses:     Principal Problem:   Acute on chronic combined systolic and diastolic congestive heart failure (HCC) Active Problems:   Essential hypertension   Morbid obesity (HCC)   Hypothyroidism   Elevated troponin   CHF exacerbation ([place])    Follow-up recommendations Follow-up with PCP in 3-5 days , including all  additional recommended appointments as below Follow-up CBC, CMP in 3-5 days F/u with Dr. Daneen Schick.      Medication List    STOP taking these medications        amLODipine 5 MG tablet  Commonly known as:  NORVASC      TAKE these medications        aspirin 81 MG chewable tablet  Chew 1 tablet (81 mg total) by mouth daily.     carvedilol 25 MG tablet  Commonly known as:  COREG  Take 1 tablet (25 mg total) by mouth 2 (two) times daily with a meal.     furosemide 40 MG tablet  Commonly known as:  LASIX  Take 1 tablet (40 mg total) by mouth daily.     lisinopril 40 MG tablet  Commonly known as:  PRINIVIL,ZESTRIL  Take 1 tablet (40 mg total) by mouth daily.         Discharge Condition: Stable   Discharge Instructions     Allergies  Allergen Reactions  . Shellfish Allergy Anaphylaxis    Uncoded Allergy. Allergen: SHELLFISH  . Gadolinium Derivatives Nausea And Vomiting    Vomiting after 20cc multihance injection  . Latex Rash      Disposition:    Consults:  Cardiology    Significant Diagnostic Studies:  Dg Chest 2 View  05/29/2015  CLINICAL DATA:  Acute onset of shortness of breath. Initial encounter. EXAM: CHEST  2 VIEW COMPARISON:  Chest radiograph performed 06/01/2010 FINDINGS: The lungs are well-aerated. Mild vascular congestion is noted. Peribronchial thickening is seen. There is no evidence of focal opacification, pleural effusion or  pneumothorax. The heart is borderline enlarged. No acute osseous abnormalities are seen. IMPRESSION: Mild vascular congestion and borderline cardiomegaly. Peribronchial thickening noted. Electronically Signed   By: Garald Balding M.D.   On: 05/29/2015 01:45    2-D echo pending    Filed Weights   05/29/15 0124 05/29/15 0501 05/30/15 0443  Weight: 115.214 kg (254 lb) 113.218 kg (249 lb 9.6 oz) 115.5 kg (254 lb 10.1 oz)     Microbiology: No results found for this or any previous visit (from the past 240 hour(s)).     Blood Culture No results found for: SDES, SPECREQUEST, CULT, REPTSTATUS    Labs: Results for orders placed or performed during the hospital encounter of 05/29/15 (from the past 48 hour(s))  CBC with Differential     Status: None   Collection Time: 05/29/15  1:31 AM  Result Value Ref Range   WBC 6.5 4.0 - 10.5 K/uL   RBC 4.71 3.87 - 5.11 MIL/uL   Hemoglobin 13.8 12.0 - 15.0 g/dL   HCT 42.4 36.0 - 46.0 %   MCV 90.0 78.0 - 100.0 fL   MCH 29.3 26.0 - 34.0 pg   MCHC 32.5 30.0 - 36.0 g/dL   RDW 13.1 11.5 - 15.5 %   Platelets 300 150 - 400 K/uL   Neutrophils Relative % 49 %   Neutro Abs 3.2  1.7 - 7.7 K/uL   Lymphocytes Relative 42 %   Lymphs Abs 2.7 0.7 - 4.0 K/uL   Monocytes Relative 5 %   Monocytes Absolute 0.4 0.1 - 1.0 K/uL   Eosinophils Relative 4 %   Eosinophils Absolute 0.2 0.0 - 0.7 K/uL   Basophils Relative 0 %   Basophils Absolute 0.0 0.0 - 0.1 K/uL  Basic metabolic panel     Status: None   Collection Time: 05/29/15  1:31 AM  Result Value Ref Range   Sodium 139 135 - 145 mmol/L   Potassium 3.8 3.5 - 5.1 mmol/L   Chloride 103 101 - 111 mmol/L   CO2 27 22 - 32 mmol/L   Glucose, Bld 96 65 - 99 mg/dL   BUN 12 6 - 20 mg/dL   Creatinine, Ser 0.92 0.44 - 1.00 mg/dL   Calcium 9.2 8.9 - 10.3 mg/dL   GFR calc non Af Amer >60 >60 mL/min   GFR calc Af Amer >60 >60 mL/min    Comment: (NOTE) The eGFR has been calculated using the CKD EPI equation. This  calculation has not been validated in all clinical situations. eGFR's persistently <60 mL/min signify possible Chronic Kidney Disease.    Anion gap 9 5 - 15  Brain natriuretic peptide     Status: Abnormal   Collection Time: 05/29/15  1:31 AM  Result Value Ref Range   B Natriuretic Peptide 553.6 (H) 0.0 - 100.0 pg/mL  Troponin I     Status: Abnormal   Collection Time: 05/29/15  1:31 AM  Result Value Ref Range   Troponin I 0.05 (H) <0.031 ng/mL    Comment:        PERSISTENTLY INCREASED TROPONIN VALUES IN THE RANGE OF 0.04-0.49 ng/mL CAN BE SEEN IN:       -UNSTABLE ANGINA       -CONGESTIVE HEART FAILURE       -MYOCARDITIS       -CHEST TRAUMA       -ARRYHTHMIAS       -LATE PRESENTING MYOCARDIAL INFARCTION       -COPD   CLINICAL FOLLOW-UP RECOMMENDED.   Urine rapid drug screen (hosp performed)     Status: None   Collection Time: 05/29/15  4:47 AM  Result Value Ref Range   Opiates NONE DETECTED NONE DETECTED   Cocaine NONE DETECTED NONE DETECTED   Benzodiazepines NONE DETECTED NONE DETECTED   Amphetamines NONE DETECTED NONE DETECTED   Tetrahydrocannabinol NONE DETECTED NONE DETECTED   Barbiturates NONE DETECTED NONE DETECTED    Comment:        DRUG SCREEN FOR MEDICAL PURPOSES ONLY.  IF CONFIRMATION IS NEEDED FOR ANY PURPOSE, NOTIFY LAB WITHIN 5 DAYS.        LOWEST DETECTABLE LIMITS FOR URINE DRUG SCREEN Drug Class       Cutoff (ng/mL) Amphetamine      1000 Barbiturate      200 Benzodiazepine   355 Tricyclics       732 Opiates          300 Cocaine          300 THC              50   TSH     Status: None   Collection Time: 05/29/15  5:15 AM  Result Value Ref Range   TSH 3.346 0.350 - 4.500 uIU/mL  Protime-INR     Status: None   Collection Time: 05/29/15  5:15 AM  Result  Value Ref Range   Prothrombin Time 13.7 11.6 - 15.2 seconds   INR 1.03 0.00 - 1.49  APTT     Status: None   Collection Time: 05/29/15  5:15 AM  Result Value Ref Range   aPTT 30 24 - 37 seconds   Troponin I     Status: Abnormal   Collection Time: 05/29/15  5:15 AM  Result Value Ref Range   Troponin I 0.06 (H) <0.031 ng/mL    Comment:        PERSISTENTLY INCREASED TROPONIN VALUES IN THE RANGE OF 0.04-0.49 ng/mL CAN BE SEEN IN:       -UNSTABLE ANGINA       -CONGESTIVE HEART FAILURE       -MYOCARDITIS       -CHEST TRAUMA       -ARRYHTHMIAS       -LATE PRESENTING MYOCARDIAL INFARCTION       -COPD   CLINICAL FOLLOW-UP RECOMMENDED.   hCG, quantitative, pregnancy     Status: None   Collection Time: 05/29/15  5:15 AM  Result Value Ref Range   hCG, Beta Chain, Quant, S <1 <5 mIU/mL    Comment:          GEST. AGE      CONC.  (mIU/mL)   <=1 WEEK        5 - 50     2 WEEKS       50 - 500     3 WEEKS       100 - 10,000     4 WEEKS     1,000 - 30,000     5 WEEKS     3,500 - 115,000   6-8 WEEKS     12,000 - 270,000    12 WEEKS     15,000 - 220,000        FEMALE AND NON-PREGNANT FEMALE:     LESS THAN 5 mIU/mL   Troponin I     Status: Abnormal   Collection Time: 05/29/15  9:09 AM  Result Value Ref Range   Troponin I 0.04 (H) <0.031 ng/mL    Comment:        PERSISTENTLY INCREASED TROPONIN VALUES IN THE RANGE OF 0.04-0.49 ng/mL CAN BE SEEN IN:       -UNSTABLE ANGINA       -CONGESTIVE HEART FAILURE       -MYOCARDITIS       -CHEST TRAUMA       -ARRYHTHMIAS       -LATE PRESENTING MYOCARDIAL INFARCTION       -COPD   CLINICAL FOLLOW-UP RECOMMENDED.   Troponin I     Status: None   Collection Time: 05/29/15  4:04 PM  Result Value Ref Range   Troponin I 0.03 <0.031 ng/mL    Comment:        NO INDICATION OF MYOCARDIAL INJURY.   CBC     Status: None   Collection Time: 05/30/15  2:20 AM  Result Value Ref Range   WBC 6.6 4.0 - 10.5 K/uL   RBC 4.55 3.87 - 5.11 MIL/uL   Hemoglobin 13.5 12.0 - 15.0 g/dL   HCT 40.7 36.0 - 46.0 %   MCV 89.5 78.0 - 100.0 fL   MCH 29.7 26.0 - 34.0 pg   MCHC 33.2 30.0 - 36.0 g/dL   RDW 13.1 11.5 - 15.5 %   Platelets 282 150 - 400 K/uL   Comprehensive metabolic panel  Status: Abnormal   Collection Time: 05/30/15  2:20 AM  Result Value Ref Range   Sodium 138 135 - 145 mmol/L   Potassium 3.5 3.5 - 5.1 mmol/L   Chloride 100 (L) 101 - 111 mmol/L   CO2 30 22 - 32 mmol/L   Glucose, Bld 95 65 - 99 mg/dL   BUN 13 6 - 20 mg/dL   Creatinine, Ser 0.87 0.44 - 1.00 mg/dL   Calcium 9.2 8.9 - 10.3 mg/dL   Total Protein 6.4 (L) 6.5 - 8.1 g/dL   Albumin 3.2 (L) 3.5 - 5.0 g/dL   AST 27 15 - 41 U/L   ALT 31 14 - 54 U/L   Alkaline Phosphatase 74 38 - 126 U/L   Total Bilirubin 0.5 0.3 - 1.2 mg/dL   GFR calc non Af Amer >60 >60 mL/min   GFR calc Af Amer >60 >60 mL/min    Comment: (NOTE) The eGFR has been calculated using the CKD EPI equation. This calculation has not been validated in all clinical situations. eGFR's persistently <60 mL/min signify possible Chronic Kidney Disease.    Anion gap 8 5 - 15     Lipid Panel     Component Value Date/Time   CHOL  06/02/2010 0515    121        ATP III CLASSIFICATION:  <200     mg/dL   Desirable  200-239  mg/dL   Borderline High  >=240    mg/dL   High          TRIG 29 06/02/2010 0515   HDL 36* 06/02/2010 0515   CHOLHDL 3.4 06/02/2010 0515   VLDL 6 06/02/2010 0515   LDLCALC  06/02/2010 0515    79        Total Cholesterol/HDL:CHD Risk Coronary Heart Disease Risk Table                     Men   Women  1/2 Average Risk   3.4   3.3  Average Risk       5.0   4.4  2 X Average Risk   9.6   7.1  3 X Average Risk  23.4   11.0        Use the calculated Patient Ratio above and the CHD Risk Table to determine the patient's CHD Risk.        ATP III CLASSIFICATION (LDL):  <100     mg/dL   Optimal  100-129  mg/dL   Near or Above                    Optimal  130-159  mg/dL   Borderline  160-189  mg/dL   High  >190     mg/dL   Very High     Lab Results  Component Value Date   HGBA1C 5.4 07/27/2014   HGBA1C  06/01/2010    5.2 (NOTE)                                                                        According to the ADA Clinical Practice Recommendations for 2011, when HbA1c is used as a screening test:   >=6.5%   Diagnostic of  Diabetes Mellitus           (if abnormal result  is confirmed)  5.7-6.4%   Increased risk of developing Diabetes Mellitus  References:Diagnosis and Classification of Diabetes Mellitus,Diabetes DBZM,0802,23(VKPQA 1):S62-S69 and Standards of Medical Care in         Diabetes - 2011,Diabetes ESLP,5300,51  (Suppl 1):S11-S61.   HGBA1C  06/01/2010    5.1 (NOTE)                                                                       According to the ADA Clinical Practice Recommendations for 2011, when HbA1c is used as a screening test:   >=6.5%   Diagnostic of Diabetes Mellitus           (if abnormal result  is confirmed)  5.7-6.4%   Increased risk of developing Diabetes Mellitus  References:Diagnosis and Classification of Diabetes Mellitus,Diabetes TMYT,1173,56(POLID 1):S62-S69 and Standards of Medical Care in         Diabetes - 2011,Diabetes CVUD,3143,88  (Suppl 1):S11-S61.     Lab Results  Component Value Date   [facility]  06/02/2010    79        Total Cholesterol/HDL:CHD Risk Coronary Heart Disease Risk Table                     Men   Women  1/2 Average Risk   3.4   3.3  Average Risk       5.0   4.4  2 X Average Risk   9.6   7.1  3 X Average Risk  23.4   11.0        Use the calculated Patient Ratio above and the CHD Risk Table to determine the patient's CHD Risk.        ATP III CLASSIFICATION (LDL):  <100     mg/dL   Optimal  100-129  mg/dL   Near or Above                    Optimal  130-159  mg/dL   Borderline  160-189  mg/dL   High  >190     mg/dL   Very High   CREATININE 0.87 05/30/2015     HPI :*44 y.o. female with past medical history of chronic combined systolic and diastolic CHF (EF 87-57% in 2011), HTN, Hypothyroidism, and medication noncompliance who presented to [facility] ED on 05/29/2015 for worsening dyspnea over the  past two weeks  She reports taking her medications (Lisinopril, Carvedilol, Lasix, and Amlodipine) intermittently since April of 2016. She would take them 1-2 times a week. When asked about this she reports "someone my age should not be on so many medications". She reports going to the gym on a daily basis for the past few years and did not have any difficulty with activity until two weeks ago. When doing aerobic activities she noticed more shortness of breath. She did take a few doses of Lasix last week but ran out on 05/27/2015, which prompted her to come to the ED.   She denies any other symptoms such as chest pain, nausea, vomiting, diaphoresis, lower extremity edema, PND, or orthopnea. She does note her dyspnea was present at rest  and with exertion.  While admitted, her BNP was elevated to 553.6. Cyclic troponin values peaked at 0.06. TSH normal at 3.346. CXR showed mild vascular congestion and borderline cardiomegaly. She was given one dose of Lasix 35m IV overnight and has since been started back on her home dose of Lasix 455mPO daily. At the time of examination, she reports her shortness of breath has resolved. She denies any other symptoms at this time. Unsure of her baseline weight.  Her last echocardiogram was in 2011 and showed an EF of 25-30%. RHC and LHC were performed at that time showing normal coronary arteries. She was last seen by Dr. SmTamala Juliann 01/2014 and reported being noncompliant with her Synthroid at that time. She did report compliance with her cardiovascular medications. Denied any anginal symptoms at that time.    HOSPITAL COURSE:    1. Acute on Chronic Combined Systolic and Diastolic CHF - EF was 2553-97%n 2011. Cath was performed at that time showing normal coronary arteries. - BNP elevated to 553.6 at times of admission. - New echocardiogram is pending. - Continue to obtain strict I&O's and daily weights. Patient responded well to diuresis with IV Lasix 4065m  cardiology consulted and the patient was switched to Lasix 62m8m daily. Advised to continue with Coreg and lisinopril Emphasized medication compliance , seen by cardiology did recommend follow-up with Dr. HenrDaneen Schickinvasive workup at this time Patient is discharged home after completion of 2-D echo  2. HTN patient was hypertensive in the 180s when she was admitted, blood pressure dropped to 110 after being started on heart failure medications Systolic blood pressure remained in the 110-120 range prior to discharge -Continue Carvedilol 25mg37m and Lisinopril 62mg 41my have been resumed.  3. Elevated Troponin - cyclic troponin values have been 0.05, 0.06, and 0.04, patient was found to be chest pain-free - EKG without acute ischemic changes Cardiology does not want to pursue any ischemia workup at this time.     Discharge Exam:    Blood pressure 128/81, pulse 64, temperature 97.9 F (36.6 C), temperature source Oral, resp. rate 18, height '5\' 8"'  (1.727 m), weight 115.5 kg (254 lb 10.1 oz), last menstrual period 05/22/2015, SpO2 98 %.  General: Pleasant, African American female in NAD Psych: Normal affect. Neuro: Alert and oriented X 3. Moves all extremities spontaneously. HEENT: Normal Neck: Supple without bruits. JVD mildy elevated. Lungs: Resp regular and unlabored, CTA without wheezing or rales. Heart: RRR no s3, s4, or murmurs. Abdomen: Soft, non-tender, non-distended, BS + x 4.  Extremities: No clubbing, cyanosis or edema. DP/PT/Radials 2+ and equal bilaterally.        Follow-up Information    Follow up with REDMON,NOELLE, PA-C. Schedule an appointment as soon as possible for a visit in 3 days.   Specialty:  Nurse Practitioner   Contact information:   [address] [place]  [address] [phone]       Follow up with SMITH Sinclair GroomsSchedule an appointment as soon as possible for a visit in 1 week.   Specialty:  Cardiology    Contact information:   [id number] [place] [phone]       Signed: Joey Hudock,Reyne Dumas/2016, 9:43 AM        Time spent >45 mins

## 2015-05-30 NOTE — Progress Notes (Signed)
Patient alert and oriented, denies pain, no shortness of breath, v/s stable, iv and tele d./c.D/c instruction explain and given to  the patient, all questions answered, patient verbalized understanding.Patient d/c home per order.

## 2015-05-30 NOTE — Progress Notes (Signed)
SATURATION QUALIFICATIONS: (This note is used to comply with regulatory documentation for home oxygen)  Patient Saturations on Room Air at Rest =98%  Patient Saturations on Room Air while Ambulating = 97%  Patient Saturations on RA Liters of oxygen while Ambulating = 97%  Please briefly explain why patient needs home oxygen:

## 2015-05-31 NOTE — Telephone Encounter (Signed)
Patient contacted regarding discharge from [facility] on 05/30/2015.  Patient understands to follow up with provider Richardson Dopp PA on 06/11/2015 at 53 at [address]. Patient understands discharge instructions? yes Patient understands medications and regiment? yes Patient understands to bring all medications to this visit? yes  Informed patient to arrive 15 minutes early for registation

## 2015-06-10 NOTE — Progress Notes (Signed)
Cardiology Office Note   Date:  12/75/1700   ID:  Suzanne Long, DOB [birth year], MRN [id number]   Patient Care Team: Lennie Odor, PA-C as PCP - General (Nurse Practitioner) Belva Crome, MD as Consulting Physician (Cardiology)    Chief Complaint  Patient presents with  . Hospitalization Follow-up    a/c combined systolic and diastolic CHF  . Congestive Heart Failure     History of Present Illness: Suzanne Long is a 44 y.o. female with a hx of NICM, combined systolic and diastolic CHF, HTN.  Last seen by Dr. Daneen Schick 7/15.  She was admitted 11/15-11/6 with acute on chronic combined systolic and diastolic CHF.  She had been taking her medications intermittently. She responded well to one dose of IV Lasix.  She did have minimally elevated Troponins in a flat pattern felt to represent demand ischemia.  She was seen by Cardiology. No further ischemic testing was suggested. FU echo demonstrated an EF of 30-35%.  She returns for FU. She is doing well.  She has been back to the gym and walking on a treadmill. She denies significant dyspnea. Denies wheezing. Denies cough.  Denies chest pain, orthopnea, PND, edema.  She has been adherent with her medications since DC from the hospital.    Studies/Reports Reviewed Today:  Echo 05/30/15 Mild LVH, EF 30-35%, mod LAE  LHC 05/2010 EF 25% LM: normal LAD:  Normal LCx: normal RCA: normal   Past Medical History  Diagnosis Date  . Hypertension   . CHF (congestive heart failure) ([place])   . Hypothyroidism     Past Surgical History  Procedure Laterality Date  . None       Current Outpatient Prescriptions  Medication Sig Dispense Refill  . aspirin 81 MG chewable tablet Chew 1 tablet (81 mg total) by mouth daily. 30 tablet 1  . carvedilol (COREG) 25 MG tablet Take 1 tablet (25 mg total) by mouth 2 (two) times daily with a meal. 60 tablet 11  . furosemide (LASIX) 40 MG tablet Take 1 tablet (40 mg total) by mouth daily. 30 tablet  11  . lisinopril (PRINIVIL,ZESTRIL) 40 MG tablet Take 1 tablet (40 mg total) by mouth daily. 30 tablet 11   No current facility-administered medications for this visit.    Allergies:   Shellfish allergy; Gadolinium derivatives; and Latex    Social History:   Social History   Social History  . Marital Status: Married    Spouse Name: [name]  . Number of Children: 2  . Years of Education: 12   Social History Main Topics  . Smoking status: Never Smoker   . Smokeless tobacco: Never Used  . Alcohol Use: 0.0 oz/week    0 Standard drinks or equivalent per week     Comment: Occ  . Drug Use: No  . Sexual Activity: Not Asked   Other Topics Concern  . None   Social History Narrative   Patient is married husband name Danny    Patient has 2 children. Both have past away.    Patient is right handed.    Patient is unemployed    Patient has a high school education         Family History:   Family History  Problem Relation Age of Onset  . Migraines Neg Hx   . Multiple sclerosis Neg Hx       ROS:   Please see the history of present illness.   Review of Systems  Cardiovascular: Positive  for dyspnea on exertion.  All other systems reviewed and are negative.     PHYSICAL EXAM: VS:  BP 120/72 mmHg  Pulse 61  Ht 5' 8.5" (1.74 m)  Wt 253 lb 12.8 oz (115.123 kg)  BMI 38.02 kg/m2  LMP 05/22/2015 (Approximate)    Wt Readings from Last 3 Encounters:  06/11/15 253 lb 12.8 oz (115.123 kg)  05/30/15 254 lb 10.1 oz (115.5 kg)  09/19/14 240 lb (108.863 kg)     GEN: Well nourished, well developed, in no acute distress HEENT: normal Neck: no JVD,   no masses Cardiac:  Normal S1/S2, RRR; no murmur ,  no rubs or gallops, no edema   Respiratory:  clear to auscultation bilaterally, no wheezing, rhonchi or rales. GI: soft, nontender, nondistended, + BS MS: no deformity or atrophy Skin: warm and dry  Neuro:  CNs II-XII intact, Strength and sensation are intact Psych: Normal  affect   EKG:  EKG is ordered today.  It demonstrates:   NSR, HR 61, normal axis, TWI in 1, aVL, V6, no change since prior tracing, QTc 428 ms   Recent Labs: 05/29/2015: B Natriuretic Peptide 553.6*; TSH 3.346 05/30/2015: ALT 31; BUN 13; Creatinine, Ser 0.87; Hemoglobin 13.5; Platelets 282; Potassium 3.5; Sodium 138    Lipid Panel    Component Value Date/Time   CHOL  06/02/2010 0515    121        ATP III CLASSIFICATION:  <200     mg/dL   Desirable  200-239  mg/dL   Borderline High  >=240    mg/dL   High          TRIG 29 06/02/2010 0515   HDL 36* 06/02/2010 0515   CHOLHDL 3.4 06/02/2010 0515   VLDL 6 06/02/2010 0515   LDLCALC  06/02/2010 0515    79        Total Cholesterol/HDL:CHD Risk Coronary Heart Disease Risk Table                     Men   Women  1/2 Average Risk   3.4   3.3  Average Risk       5.0   4.4  2 X Average Risk   9.6   7.1  3 X Average Risk  23.4   11.0        Use the calculated Patient Ratio above and the CHD Risk Table to determine the patient's CHD Risk.        ATP III CLASSIFICATION (LDL):  <100     mg/dL   Optimal  100-129  mg/dL   Near or Above                    Optimal  130-159  mg/dL   Borderline  160-189  mg/dL   High  >190     mg/dL   Very High      ASSESSMENT AND PLAN:  1. Chronic Combined Systolic and Diastolic CHF:  She is doing well since DC from the hospital.  She is NYHA 1-2. Will get repeat BMET today to recheck renal function and K+.    2. NICM:  She had a normal heart cath in 2011.  She is felt to have DCM related to uncontrolled HTN.  She is now normotensive. She is adherent with her medications.  She tells me that her EF had improved in the past.  We discussed the importance of taking her medications daily.  Will  arrange a FU Echo in 3 mos.  If EF remains < 35% with complete adherence with medications, consider referral to EP for ICD.  3. Hypertensive Heart Disease:  Controlled.      Medication Changes: Current  medicines are reviewed at length with the patient today.  Concerns regarding medicines are as outlined above.  The following changes have been made:   Discontinued Medications   No medications on file   Modified Medications   Modified Medication Previous Medication   CARVEDILOL (COREG) 25 MG TABLET carvedilol (COREG) 25 MG tablet      Take 1 tablet (25 mg total) by mouth 2 (two) times daily with a meal.    Take 1 tablet (25 mg total) by mouth 2 (two) times daily with a meal.   FUROSEMIDE (LASIX) 40 MG TABLET furosemide (LASIX) 40 MG tablet      Take 1 tablet (40 mg total) by mouth daily.    Take 1 tablet (40 mg total) by mouth daily.   LISINOPRIL (PRINIVIL,ZESTRIL) 40 MG TABLET lisinopril (PRINIVIL,ZESTRIL) 40 MG tablet      Take 1 tablet (40 mg total) by mouth daily.    Take 1 tablet (40 mg total) by mouth daily.   New Prescriptions   No medications on file   Labs/ tests ordered today include:   Orders Placed This Encounter  Procedures  . Basic Metabolic Panel (BMET)  . EKG 12-Lead  . Echocardiogram     Disposition:    FU with Dr. Daneen Schick in 3 mos (after echo done).     Signed, Versie Starks, MHS 06/11/2015 1:19 PM    [facility] [address], [place], [place]  [address] Phone: [phone]; Fax: [phone]

## 2015-06-11 ENCOUNTER — Encounter: Payer: Self-pay | Admitting: Physician Assistant

## 2015-06-11 ENCOUNTER — Ambulatory Visit (INDEPENDENT_AMBULATORY_CARE_PROVIDER_SITE_OTHER): Admitting: Physician Assistant

## 2015-06-11 VITALS — BP 120/72 | HR 61 | Ht 68.5 in | Wt 253.8 lb

## 2015-06-11 DIAGNOSIS — I429 Cardiomyopathy, unspecified: Secondary | ICD-10-CM

## 2015-06-11 DIAGNOSIS — I110 Hypertensive heart disease with heart failure: Secondary | ICD-10-CM | POA: Diagnosis not present

## 2015-06-11 DIAGNOSIS — I428 Other cardiomyopathies: Secondary | ICD-10-CM

## 2015-06-11 DIAGNOSIS — I5042 Chronic combined systolic (congestive) and diastolic (congestive) heart failure: Secondary | ICD-10-CM

## 2015-06-11 LAB — BASIC METABOLIC PANEL
BUN: 12 mg/dL (ref 7–25)
CO2: 30 mmol/L (ref 20–31)
Calcium: 9.1 mg/dL (ref 8.6–10.2)
Chloride: 102 mmol/L (ref 98–110)
Creat: 0.85 mg/dL (ref 0.50–1.10)
Glucose, Bld: 82 mg/dL (ref 65–99)
Potassium: 3.8 mmol/L (ref 3.5–5.3)
Sodium: 139 mmol/L (ref 135–146)

## 2015-06-11 MED ORDER — CARVEDILOL 25 MG PO TABS: 25.0000 mg | ORAL_TABLET | Freq: Two times a day (BID) | ORAL | Status: DC

## 2015-06-11 MED ORDER — FUROSEMIDE 40 MG PO TABS: 40.0000 mg | ORAL_TABLET | Freq: Every day | ORAL | Status: DC

## 2015-06-11 MED ORDER — LISINOPRIL 40 MG PO TABS: 40.0000 mg | ORAL_TABLET | Freq: Every day | ORAL | Status: DC

## 2015-06-11 NOTE — Patient Instructions (Signed)
Medication Instructions:  REFILLS SENT IN FOR LISINOPRIL,  COREG AND LASIX  Labwork: TODAY BMET  Testing/Procedures: Your physician has requested that you have an echocardiogram DX DILATED CARDIOMYOPATHY; THIS IS TO BE DONE 3 MONTHS FROM NOW; THOUGH WILL NEED TO BE DONE ABOUT 1 WEEK BEFORE APPT WITH DR. Tamala Julian IN 3 MONTHS. Echocardiography is a painless test that uses sound waves to create images of your heart. It provides your doctor with information about the size and shape of your heart and how well your heart's chambers and valves are working. This procedure takes approximately one hour. There are no restrictions for this procedure.   Follow-Up: 3 MONTHS WITH DR. Tamala Julian  Any Other Special Instructions Will Be Listed Below (If Applicable).     If you need a refill on your cardiac medications before your next appointment, please call your pharmacy.

## 2015-06-13 ENCOUNTER — Telehealth: Payer: Self-pay | Admitting: *Deleted

## 2015-06-13 NOTE — Telephone Encounter (Signed)
Pt notified of lab results by phone with verbal understanding.  

## 2015-06-23 IMAGING — XA DG FLUORO GUIDE LUMBAR PUNCTURE
1 series · 1 of 1 positions shown · non-contrast
Comparison: none

CLINICAL DATA: Visual symptoms

[Series 1: ortho standard · 1 of 1 slices shown]
[im 1/1]
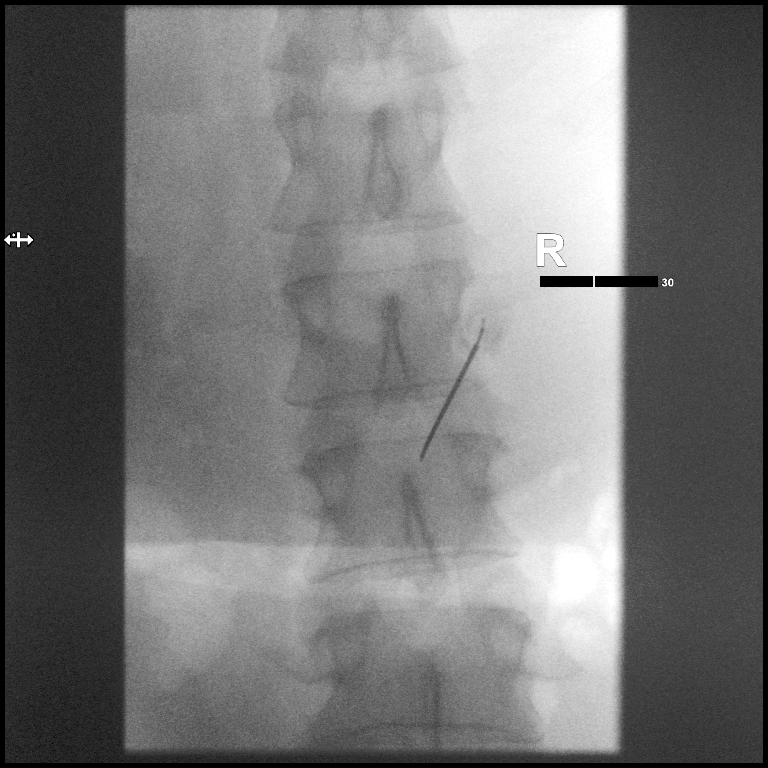

[1 of 1 positions shown; findings below may reference images not displayed]

EXAM:
DIAGNOSTIC LUMBAR PUNCTURE UNDER FLUOROSCOPIC GUIDANCE

FLUOROSCOPY TIME:  1 minutes and 1 second

PROCEDURE:
Informed consent was obtained from the patient prior to the
procedure, including potential complications of headache, allergy,
and pain. With the patient prone, the lower back was prepped with
Betadine. 1% Lidocaine was used for local anesthesia. Lumbar
puncture was performed at the L3-4 level using a 20 gauge needle
with return of clear CSF with an opening pressure of 30 cm water.
Fifteenml of CSF were obtained for laboratory studies. The patient
tolerated the procedure well and there were no apparent
complications.
FINDINGS: The image documents needle placement in the thecal sac.

COMPLICATIONS:
None.
IMPRESSION: Successful lumbar puncture.

## 2015-09-11 ENCOUNTER — Other Ambulatory Visit: Payer: Self-pay

## 2015-09-11 ENCOUNTER — Encounter: Payer: Self-pay | Admitting: Physician Assistant

## 2015-09-11 ENCOUNTER — Ambulatory Visit (HOSPITAL_COMMUNITY): Attending: Cardiovascular Disease

## 2015-09-11 DIAGNOSIS — E039 Hypothyroidism, unspecified: Secondary | ICD-10-CM | POA: Diagnosis not present

## 2015-09-11 DIAGNOSIS — I429 Cardiomyopathy, unspecified: Secondary | ICD-10-CM | POA: Diagnosis not present

## 2015-09-11 DIAGNOSIS — I110 Hypertensive heart disease with heart failure: Secondary | ICD-10-CM | POA: Diagnosis not present

## 2015-09-11 DIAGNOSIS — I509 Heart failure, unspecified: Secondary | ICD-10-CM | POA: Insufficient documentation

## 2015-09-11 DIAGNOSIS — I428 Other cardiomyopathies: Secondary | ICD-10-CM

## 2015-09-11 DIAGNOSIS — Z6838 Body mass index (BMI) 38.0-38.9, adult: Secondary | ICD-10-CM | POA: Diagnosis not present

## 2015-09-11 MED ORDER — PERFLUTREN LIPID MICROSPHERE
1.0000 mL | INTRAVENOUS | Status: AC | PRN
  Administered 2015-09-11: 1 mL via INTRAVENOUS

## 2015-09-14 ENCOUNTER — Telehealth: Payer: Self-pay

## 2015-09-14 DIAGNOSIS — Z79899 Other long term (current) drug therapy: Secondary | ICD-10-CM

## 2015-09-14 MED ORDER — SACUBITRIL-VALSARTAN 49-51 MG PO TABS: 1.0000 | ORAL_TABLET | Freq: Two times a day (BID) | ORAL | Status: DC

## 2015-09-14 NOTE — Telephone Encounter (Signed)
Left message for patient to call back  

## 2015-09-14 NOTE — Telephone Encounter (Signed)
Patient called back about echo results. Per Richardson Dopp PA, reviewed echo with Dr. Tamala Julian and EF remains low at 30-35%. Patient needs to discontinue lisinopril, 36 hours after last dose of lisinopril start Entresto 49/51 mg by mouth twice daily. Samples are at front desk for patient, she will not be able to pick up until Monday. Patient needs referral to CHF clinic in the next 2-3 weeks and a BMET in 1 week. Patient has follow-up appointment with Dr. Tamala Julian on 11/19/15. Patient is coming in for BMET next Friday 09/21/15. Patient verbalized understanding.

## 2015-09-14 NOTE — Telephone Encounter (Signed)
-----   Message from Liliane Shi, [place] sent at 09/14/2015  1:29 PM EST ----- Please tell patient I reviewed her Echo with Dr. Tamala Julian. Her EF remains low at 30-35%. He would like to adjust her medications and have her FU with the Heart Failure Clinic. >> DC Lisinopril >> 36 hours after last dose of Lisinopril:  Start Entresto 49/51 mg Twice daily (we can give her samples and Rx coverage card) >> Refer to CHF Clinic - next 2-3 weeks >> BMET in 1 week >> Keep FU with Dr. Tamala Julian in May 2017 Richardson Dopp, PA-C   09/14/2015 1:28 PM

## 2015-09-20 ENCOUNTER — Ambulatory Visit: Admitting: Interventional Cardiology

## 2015-09-21 ENCOUNTER — Other Ambulatory Visit (INDEPENDENT_AMBULATORY_CARE_PROVIDER_SITE_OTHER): Admitting: *Deleted

## 2015-09-21 DIAGNOSIS — Z79899 Other long term (current) drug therapy: Secondary | ICD-10-CM | POA: Diagnosis not present

## 2015-09-21 LAB — BASIC METABOLIC PANEL
BUN: 8 mg/dL (ref 7–25)
CO2: 32 mmol/L — ABNORMAL HIGH (ref 20–31)
Calcium: 9.1 mg/dL (ref 8.6–10.2)
Chloride: 100 mmol/L (ref 98–110)
Creat: 0.84 mg/dL (ref 0.50–1.10)
Glucose, Bld: 84 mg/dL (ref 65–99)
Potassium: 3.7 mmol/L (ref 3.5–5.3)
Sodium: 138 mmol/L (ref 135–146)

## 2015-09-24 ENCOUNTER — Other Ambulatory Visit: Payer: Self-pay | Admitting: *Deleted

## 2015-09-24 ENCOUNTER — Telehealth: Payer: Self-pay | Admitting: *Deleted

## 2015-09-24 MED ORDER — SACUBITRIL-VALSARTAN 49-51 MG PO TABS: 1.0000 | ORAL_TABLET | Freq: Two times a day (BID) | ORAL | Status: DC

## 2015-09-24 NOTE — Telephone Encounter (Signed)
Pt notified of lab reuslts by phone with verbal understanding. Pt asked for me to please send in refill for the [facility] to [facility].

## 2015-10-11 ENCOUNTER — Ambulatory Visit (HOSPITAL_COMMUNITY)
Admission: RE | Admit: 2015-10-11 | Discharge: 2015-10-11 | Disposition: A | Discharge status: Home / Self Care | Source: Ambulatory Visit | Attending: Cardiology | Admitting: Cardiology

## 2015-10-11 ENCOUNTER — Encounter (HOSPITAL_COMMUNITY): Payer: Self-pay

## 2015-10-11 VITALS — BP 150/91 | HR 70 | Ht 68.5 in | Wt 264.1 lb

## 2015-10-11 DIAGNOSIS — I110 Hypertensive heart disease with heart failure: Secondary | ICD-10-CM | POA: Diagnosis not present

## 2015-10-11 DIAGNOSIS — I5043 Acute on chronic combined systolic (congestive) and diastolic (congestive) heart failure: Secondary | ICD-10-CM | POA: Diagnosis not present

## 2015-10-11 DIAGNOSIS — E669 Obesity, unspecified: Secondary | ICD-10-CM | POA: Diagnosis not present

## 2015-10-11 DIAGNOSIS — Z7982 Long term (current) use of aspirin: Secondary | ICD-10-CM | POA: Insufficient documentation

## 2015-10-11 DIAGNOSIS — Z79899 Other long term (current) drug therapy: Secondary | ICD-10-CM | POA: Insufficient documentation

## 2015-10-11 DIAGNOSIS — I5022 Chronic systolic (congestive) heart failure: Secondary | ICD-10-CM | POA: Insufficient documentation

## 2015-10-11 DIAGNOSIS — I10 Essential (primary) hypertension: Secondary | ICD-10-CM | POA: Diagnosis not present

## 2015-10-11 DIAGNOSIS — Z6839 Body mass index (BMI) 39.0-39.9, adult: Secondary | ICD-10-CM | POA: Insufficient documentation

## 2015-10-11 DIAGNOSIS — E039 Hypothyroidism, unspecified: Secondary | ICD-10-CM

## 2015-10-11 DIAGNOSIS — I428 Other cardiomyopathies: Secondary | ICD-10-CM | POA: Insufficient documentation

## 2015-10-11 DIAGNOSIS — Z8249 Family history of ischemic heart disease and other diseases of the circulatory system: Secondary | ICD-10-CM | POA: Diagnosis not present

## 2015-10-11 LAB — BASIC METABOLIC PANEL
Anion gap: 9 (ref 5–15)
BUN: 12 mg/dL (ref 6–20)
CO2: 26 mmol/L (ref 22–32)
Calcium: 9.4 mg/dL (ref 8.9–10.3)
Chloride: 104 mmol/L (ref 101–111)
Creatinine, Ser: 0.81 mg/dL (ref 0.44–1.00)
GFR calc Af Amer: >60 mL/min (ref >60)
GFR calc non Af Amer: >60 mL/min (ref >60)
Glucose, Bld: 93 mg/dL (ref 65–99)
Potassium: 4.3 mmol/L (ref 3.5–5.1)
Sodium: 139 mmol/L (ref 135–145)

## 2015-10-11 LAB — TSH: TSH: 1.429 u[IU]/mL (ref 0.350–4.500)

## 2015-10-11 MED ORDER — SPIRONOLACTONE 25 MG PO TABS: 25.0000 mg | ORAL_TABLET | Freq: Every day | ORAL | Status: DC

## 2015-10-11 NOTE — Patient Instructions (Addendum)
Take Spironolactone 25mg  daily.  Routine lab work today. Will notify you of abnormal results  Follow up with Dr.McLean in 1 month.   If you need assistance before you next scheduled appointment  please call our office at: [phone] Mon-Fri 8am-5pm PLEASE KEEP IN MIND OUR OFFICE WILL BE CLOSED EVERYDAY FOR LUNCH BETWEEN 1PM AND 2PM [place].

## 2015-10-12 NOTE — Progress Notes (Signed)
Patient ID: Malijah Lietz, female   DOB: January 21, 1971, 45 y.o.   MRN: [id number] PCP: Lennie Odor HF Cardiology: Dr. Aundra Dubin  45 yo with history of HTN and nonischemic cardiomyopathy presents for CHF clinic evaluation.  I am seeing her for the first time today and have reviewed her old records.    She was diagnosed with CHF initially in 2011.  At that time, she was hospitalized with volume overload.  Coronary angiography in 2011 showed no significant CAD, but EF was about 25%.  BP was very high.  She was started on cardiac meds and BP improved.  Per her report, EF improved.  She eventually stopped all her medications.  She was then hospitalized in 11/16 with CHF exacerbation. EF was 30-35% on 11/16 echo and 30-35% on 2/17 echo.     She is taking all her cardiac medications now.  She is feeling better overall.  No dyspnea walking on flat ground or up a flight of steps.  She does some running on a treadmill.  No orthopnea/PND.  No chest pain.  No palpitations. She is having trouble losing weight.   ECG (11/16): NSR, nonspecific T wave flattening  Labs (3/17): K 3.7, creatinine 0.84  PMH: 1. Chronic systolic CHF: 1st noted in 2011.  Nonischemic cardiomyopathy.  - LHC (2011) with no significant CAD, EF 25%.   - Echo (11/16) with EF 30-35%, diffuse hypokinesis. - Echo (2/17) with EF 30-35%, moderate LVH, diffuse hypokinesis.  2. HTN 3. Hypothyroidism 4. Obesity.   SH: 2 sons with muscular dystrophy (one passed away).  Nonsmoker.  Rare ETOH.  Taking classes at [employer].   FH: Mother, grandmother with HTN.  Uncle with MI.   ROS: All systems reviewed and negative except as per HPI.   Current Outpatient Prescriptions  Medication Sig Dispense Refill  . aspirin 81 MG chewable tablet Chew 1 tablet (81 mg total) by mouth daily. 30 tablet 1  . carvedilol (COREG) 25 MG tablet Take 1 tablet (25 mg total) by mouth 2 (two) times daily with a meal. 60 tablet 11  . furosemide (LASIX) 40 MG tablet Take 1 tablet  (40 mg total) by mouth daily. 30 tablet 11  . sacubitril-valsartan (ENTRESTO) 49-51 MG Take 1 tablet by mouth 2 (two) times daily. 60 tablet 11  . spironolactone (ALDACTONE) 25 MG tablet Take 1 tablet (25 mg total) by mouth daily. 30 tablet 3   No current facility-administered medications for this encounter.   BP 150/91 mmHg  Pulse 70  Ht 5' 8.5" (1.74 m)  Wt 264 lb 1.9 oz (119.804 kg)  BMI 39.57 kg/m2  SpO2 100%  LMP 09/14/2015 (Approximate) General: NAD, obese.  Neck: No JVD, thyroid appears full without nodularity.  Lungs: Clear to auscultation bilaterally with normal respiratory effort. CV: Nondisplaced PMI.  Heart regular S1/S2, no S3/S4, no murmur.  No peripheral edema.  No carotid bruit.  Normal pedal pulses.  Abdomen: Soft, nontender, no hepatosplenomegaly, no distention.  Skin: Intact without lesions or rashes.  Neurologic: Alert and oriented x 3.  Psych: Normal affect. Extremities: No clubbing or cyanosis.  HEENT: Normal.   Assessment/Plan: 1. Chronic systolic CHF: Nonischemic cardiomyopathy.  Suspect hypertensive cardiomyopathy.  EF improved in the past with BP control.  No family history of cardiomyopathy.  No CAD on coronary angiography in 2011.  Most recent echo in 2/17 with EF 30-35%.  She is not volume overloaded on exam.  NYHA class I-II symptoms. - Continue current Lasix.  BMET today.  -  Continue current Coreg and Entresto.  - I will start her on spironolactone 25 mg daily.  She will need BMET in 10 days.   - Check ANA, SPEP.  - I will arrange for cardiac MRI in 6/17.  This will allow for assessment for infiltrative disease.  If EF remains low, will need to consider ICD.  She is not a CRT candidate in absence of LBBB.   2. HTN: BP still high.  I am adding spironolactone.  She has a strong family history of HTN.  Upon questioning, she does not screen positive for OSA.   3. H/o hypothyroidism: No thyroid replacement.  She wants endocrine referral.  She was supposed to  see Dr Buddy Duty in the past.  I will make the referral for her.  Check TSH today.  4. Obesity: We discussed diet and exercise for weight loss.   Followup in 1 month.   Loralie Champagne 10/12/2015

## 2015-10-15 ENCOUNTER — Telehealth: Payer: Self-pay | Admitting: Interventional Cardiology

## 2015-10-15 NOTE — Telephone Encounter (Signed)
Spoke with patient to get more information as she has an active rx already at [facility]. She was a little frustrated due to the fact that she did not need a refill and Sunday Spillers was not willing to listen to what she was saying. Patient has two days worth of medication on hand. [facility] has the rx and I called to verify what was needed and was informed that the medication requires a prior authorization through the patients insurance. Per [facility], they just within the last few minutes faxed the form to the office.

## 2015-10-15 NOTE — Telephone Encounter (Signed)
°*  STAT* If patient is at the pharmacy, call can be transferred to refill team.   1. Which medications need to be refilled? (please list name of each medication and dose if known) Entresto-New prescription-Pharmacist said they have been waiting to hear from the doctor  2. Which pharmacy/location (including street and city if local pharmacy) is medication to be sent to?[facility]-[phone](she was not sure of this phone number 3. Do they need a 30 day or 90 day supply? 90 and refills

## 2015-10-16 ENCOUNTER — Telehealth: Payer: Self-pay

## 2015-10-16 LAB — PROTEIN ELECTROPHORESIS, SERUM
A/G Ratio: 1 (ref 0.7–1.7)
Albumin ELP: 3.5 g/dL (ref 2.9–4.4)
Alpha-1-Globulin: 0.2 g/dL (ref 0.0–0.4)
Alpha-2-Globulin: 0.8 g/dL (ref 0.4–1.0)
Beta Globulin: 1.1 g/dL (ref 0.7–1.3)
Gamma Globulin: 1.5 g/dL (ref 0.4–1.8)
Globulin, Total: 3.5 g/dL (ref 2.2–3.9)
Total Protein ELP: 7 g/dL (ref 6.0–8.5)

## 2015-10-16 NOTE — Telephone Encounter (Signed)
Prior auth for [employer] 49-51 submitted to [facility].

## 2015-10-16 NOTE — Telephone Encounter (Signed)
Waiting on prior auth of Entresto. Patient has 1 more day of samples. Entresto 49-51 2 bottles provided until med gets approved.

## 2015-10-16 NOTE — Telephone Encounter (Signed)
Prior auth for [employer] 49-51 submitted to [facility].

## 2015-10-19 ENCOUNTER — Telehealth: Payer: Self-pay

## 2015-10-19 NOTE — Telephone Encounter (Signed)
APPEAL FOR ENTRESTO SENT TO [facility].

## 2015-10-24 ENCOUNTER — Telehealth: Payer: Self-pay | Admitting: Interventional Cardiology

## 2015-10-24 NOTE — Telephone Encounter (Signed)
New message      Calling to let you know that the appeal is still pending---entresto

## 2015-11-01 ENCOUNTER — Telehealth: Payer: Self-pay | Admitting: Interventional Cardiology

## 2015-11-01 NOTE — Telephone Encounter (Signed)
New Message:  Pt called in stating that her insurance denied her Delene Loll and she has 2 days left. Pt would like to know what to do moving forward. Please f/u with her

## 2015-11-01 NOTE — Telephone Encounter (Signed)
Returned pt call. Adv pt that I will leave samples on Entresto at the front desk for pick. I will send a message to [place] patient advocate nurse to make her aware of her insurance prior auth denial. Adv pt that she should f/u with Vaughan Basta to see id she might qualify for pt assistance for Entresto. Pt voiced appreciation and verbalized understanding.

## 2015-11-13 ENCOUNTER — Other Ambulatory Visit: Payer: Self-pay

## 2015-11-13 MED ORDER — SACUBITRIL-VALSARTAN 49-51 MG PO TABS: 1.0000 | ORAL_TABLET | Freq: Two times a day (BID) | ORAL | Status: DC

## 2015-11-19 ENCOUNTER — Ambulatory Visit (INDEPENDENT_AMBULATORY_CARE_PROVIDER_SITE_OTHER): Admitting: Interventional Cardiology

## 2015-11-19 ENCOUNTER — Encounter: Payer: Self-pay | Admitting: Interventional Cardiology

## 2015-11-19 VITALS — BP 92/60 | HR 75 | Ht 68.5 in | Wt 264.4 lb

## 2015-11-19 DIAGNOSIS — I5042 Chronic combined systolic (congestive) and diastolic (congestive) heart failure: Secondary | ICD-10-CM

## 2015-11-19 DIAGNOSIS — I110 Hypertensive heart disease with heart failure: Secondary | ICD-10-CM

## 2015-11-19 DIAGNOSIS — E049 Nontoxic goiter, unspecified: Secondary | ICD-10-CM

## 2015-11-19 DIAGNOSIS — I10 Essential (primary) hypertension: Secondary | ICD-10-CM

## 2015-11-19 LAB — BASIC METABOLIC PANEL
BUN: 11 mg/dL (ref 7–25)
CO2: 27 mmol/L (ref 20–31)
Calcium: 9.2 mg/dL (ref 8.6–10.2)
Chloride: 102 mmol/L (ref 98–110)
Creat: 0.81 mg/dL (ref 0.50–1.10)
Glucose, Bld: 85 mg/dL (ref 65–99)
Potassium: 3.9 mmol/L (ref 3.5–5.3)
Sodium: 138 mmol/L (ref 135–146)

## 2015-11-19 LAB — BRAIN NATRIURETIC PEPTIDE: Brain Natriuretic Peptide: 82.5 pg/mL (ref <100)

## 2015-11-19 NOTE — Patient Instructions (Signed)
Medication Instructions:  Your physician recommends that you continue on your current medications as directed. Please refer to the Current Medication list given to you today.   Labwork: Bmet, Bnp  Testing/Procedures: None ordered  Follow-Up: You have been referred to [facility] (Dr.Kerr)  Your physician recommends that you schedule a follow-up appointment in: 4 months    Any Other Special Instructions Will Be Listed Below (If Applicable).     If you need a refill on your cardiac medications before your next appointment, please call your pharmacy.

## 2015-11-19 NOTE — Progress Notes (Signed)
Cardiology Office Note   Date:  02/15/1659   ID:  Suzanne Long, DOB [birth year], MRN [id number]  PCP:  REDMON,NOELLE, PA-C  Cardiologist:  Sinclair Grooms, MD   Chief Complaint  Patient presents with  . Congestive Heart Failure      History of Present Illness: Suzanne Long is a 45 y.o. female who presents for Nonischemic cardiomyopathy, chronic systolic heart failure, hypertension, hypothyroidism, and goiter.  I do not see Mirelle in quite some time. She gives me this and intelligence that her only son, with multiple sclerosis, died. This happened over a year ago. She was recently hospitalized with volume overload. She was seen back in the clinic by Richardson Dopp. She has been referred to the advanced heart failure clinic. Overall she feels well except that on her new medication regiment she gets very tired and sleepy in the mid day. She denies orthopnea and PND. She has not had syncope.    Past Medical History  Diagnosis Date  . Hypertension   . CHF (congestive heart failure) ([place])   . Hypothyroidism   . NICM (nonischemic cardiomyopathy) ([place])     a. Echo 2/17:  moderate LVH, EF 30-35%, diffuse HK, moderate LAE    Past Surgical History  Procedure Laterality Date  . None       Current Outpatient Prescriptions  Medication Sig Dispense Refill  . aspirin 81 MG chewable tablet Chew 1 tablet (81 mg total) by mouth daily. 30 tablet 1  . carvedilol (COREG) 25 MG tablet Take 1 tablet (25 mg total) by mouth 2 (two) times daily with a meal. 60 tablet 11  . furosemide (LASIX) 40 MG tablet Take 1 tablet (40 mg total) by mouth daily. 30 tablet 11  . PROAIR HFA 108 (90 Base) MCG/ACT inhaler Inhale 2 puffs into the lungs 4 (four) times daily as needed. (SHORTNESS OF BREATH/WHEEZING)  0  . sacubitril-valsartan (ENTRESTO) 49-51 MG Take 1 tablet by mouth 2 (two) times daily. 60 tablet 10  . spironolactone (ALDACTONE) 25 MG tablet Take 1 tablet (25 mg total) by mouth daily. 30 tablet 3     No current facility-administered medications for this visit.    Allergies:   Shellfish allergy; Gadolinium derivatives; and Latex    Social History:  The patient  reports that she has never smoked. She has never used smokeless tobacco. She reports that she drinks alcohol. She reports that she does not use illicit drugs.   Family History:  The patient's family history includes Diabetes in her mother; Kidney failure in her brother; Muscular dystrophy in her brother. There is no history of Migraines or Multiple sclerosis.    ROS:  Please see the history of present illness.   Otherwise, review of systems are positive for Fatigue and weight gain.   All other systems are reviewed and negative.    PHYSICAL EXAM: VS:  BP 92/60 mmHg  Pulse 75  Ht 5' 8.5" (1.74 m)  Wt 264 lb 6.4 oz (119.931 kg)  BMI 39.61 kg/m2 , BMI Body mass index is 39.61 kg/(m^2). GEN: Well nourished, well developed, in no acute distress HEENT: normal Neck: no JVD, carotid bruits, or masses Cardiac: RRR.  There is no murmur, rub, or gallop. There is no edema. Respiratory:  clear to auscultation bilaterally, normal work of breathing. GI: soft, nontender, nondistended, + BS MS: no deformity or atrophy Skin: warm and dry, no rash Neuro:  Strength and sensation are intact Psych: euthymic mood, full affect  EKG:  EKG is not ordered today.   Recent Labs: 05/29/2015: B Natriuretic Peptide 553.6* 05/30/2015: ALT 31; Hemoglobin 13.5; Platelets 282 10/11/2015: BUN 12; Creatinine, Ser 0.81; Potassium 4.3; Sodium 139; TSH 1.429    Lipid Panel    Component Value Date/Time   CHOL  06/02/2010 0515    121        ATP III CLASSIFICATION:  <200     mg/dL   Desirable  200-239  mg/dL   Borderline High  >=240    mg/dL   High          TRIG 29 06/02/2010 0515   HDL 36* 06/02/2010 0515   CHOLHDL 3.4 06/02/2010 0515   VLDL 6 06/02/2010 0515   LDLCALC  06/02/2010 0515    79        Total Cholesterol/HDL:CHD Risk Coronary  Heart Disease Risk Table                     Men   Women  1/2 Average Risk   3.4   3.3  Average Risk       5.0   4.4  2 X Average Risk   9.6   7.1  3 X Average Risk  23.4   11.0        Use the calculated Patient Ratio above and the CHD Risk Table to determine the patient's CHD Risk.        ATP III CLASSIFICATION (LDL):  <100     mg/dL   Optimal  100-129  mg/dL   Near or Above                    Optimal  130-159  mg/dL   Borderline  160-189  mg/dL   High  >190     mg/dL   Very High      Wt Readings from Last 3 Encounters:  11/19/15 264 lb 6.4 oz (119.931 kg)  10/11/15 264 lb 1.9 oz (119.804 kg)  06/11/15 253 lb 12.8 oz (115.123 kg)      Other studies Reviewed: Additional studies/ records that were reviewed today include: Laboratory data, recent hospitalization, and heart failure clinic note.. The findings include new information disease initiation of Entresto and spironolactone..    ASSESSMENT AND PLAN:  1. Chronic combined systolic and diastolic CHF (congestive heart failure) (HCC) There is no evidence of volume overload on today's exam.  2. Essential hypertension Excellent blood pressure control  3. Hypertensive heart disease with heart failure (HCC) Excellent blood pressure control  4. Morbid obesity, unspecified obesity type ([place]) Needs management.  5. Thyroid goiter She needs to have an endocrinology appointment  Current medicines are reviewed at length with the patient today.  The patient has the following concerns regarding medicines: .  The following changes/actions have been instituted:    Appointment with Dr. Delrae Rend endocrinology  Basic metabolic panel and BNP today  Daily weights on the same circumstances  Low-salt diet  Labs/ tests ordered today include:  No orders of the defined types were placed in this encounter.    Greater than 40 minutes spent with the patient with 50% in face to face conversation planning therapy, evaluation, and  answering questions.   Disposition:   FU with HS in 4 months  Signed, Sinclair Grooms, MD  11/19/2015 8:23 AM    [address], [place], [place]  [address] Phone: [phone]; Fax: [phone]

## 2015-11-21 ENCOUNTER — Encounter (HOSPITAL_COMMUNITY): Payer: Self-pay

## 2015-11-21 ENCOUNTER — Telehealth (HOSPITAL_COMMUNITY): Payer: Self-pay | Admitting: Pharmacist

## 2015-11-21 ENCOUNTER — Ambulatory Visit (HOSPITAL_COMMUNITY)
Admission: RE | Admit: 2015-11-21 | Discharge: 2015-11-21 | Disposition: A | Discharge status: Home / Self Care | Source: Ambulatory Visit | Attending: Cardiology | Admitting: Cardiology

## 2015-11-21 VITALS — BP 100/64 | HR 67 | Resp 18 | Wt 261.5 lb

## 2015-11-21 DIAGNOSIS — I5022 Chronic systolic (congestive) heart failure: Secondary | ICD-10-CM | POA: Diagnosis not present

## 2015-11-21 DIAGNOSIS — I5043 Acute on chronic combined systolic (congestive) and diastolic (congestive) heart failure: Secondary | ICD-10-CM

## 2015-11-21 DIAGNOSIS — Z7982 Long term (current) use of aspirin: Secondary | ICD-10-CM | POA: Diagnosis not present

## 2015-11-21 DIAGNOSIS — E669 Obesity, unspecified: Secondary | ICD-10-CM | POA: Diagnosis not present

## 2015-11-21 DIAGNOSIS — E039 Hypothyroidism, unspecified: Secondary | ICD-10-CM | POA: Diagnosis not present

## 2015-11-21 DIAGNOSIS — Z79899 Other long term (current) drug therapy: Secondary | ICD-10-CM | POA: Diagnosis not present

## 2015-11-21 DIAGNOSIS — Z8249 Family history of ischemic heart disease and other diseases of the circulatory system: Secondary | ICD-10-CM | POA: Diagnosis not present

## 2015-11-21 DIAGNOSIS — I428 Other cardiomyopathies: Secondary | ICD-10-CM | POA: Diagnosis not present

## 2015-11-21 DIAGNOSIS — I110 Hypertensive heart disease with heart failure: Secondary | ICD-10-CM | POA: Insufficient documentation

## 2015-11-21 DIAGNOSIS — I10 Essential (primary) hypertension: Secondary | ICD-10-CM

## 2015-11-21 NOTE — Telephone Encounter (Signed)
Verified with [place] that Entresto 49-51 mg BID Rx was covered by insurance and patient paid $24 for 1 month supply.   Ruta Hinds. Velva Harman, PharmD, BCPS, CPP Clinical Pharmacist Pager: [phone] Phone: [phone] 11/21/2015 10:37 AM

## 2015-11-21 NOTE — Patient Instructions (Signed)
Your provider requests you have a Cardiac MRI in June. They will call you to schedule that appointment.  Follow up with Dr.McLean in 6 weeks.

## 2015-11-22 DIAGNOSIS — I5022 Chronic systolic (congestive) heart failure: Secondary | ICD-10-CM | POA: Insufficient documentation

## 2015-11-22 NOTE — Progress Notes (Signed)
Patient ID: Suzanne Long, female   DOB: 1970-10-03, 45 y.o.   MRN: [id number] PCP: Lennie Odor HF Cardiology: Dr. Aundra Dubin  45 y.o. with history of HTN and nonischemic cardiomyopathy presents for CHF clinic evaluation.  I am seeing her for the first time today and have reviewed her old records.    She was diagnosed with CHF initially in 2011.  At that time, she was hospitalized with volume overload.  Coronary angiography in 2011 showed no significant CAD, but EF was about 25%.  BP was very high.  She was started on cardiac meds and BP improved.  Per her report, EF improved.  She eventually stopped all her medications.  She was then hospitalized in 11/16 with CHF exacerbation. EF was 30-35% on 11/16 echo and 30-35% on 2/17 echo.     She is taking all her cardiac medications now.  BP is running considerably lower than in the past. She gets a little sleepy around mid-day, attributes this to the meds.  No lightheadedness or syncope.  She is feeling better overall.  No dyspnea walking on flat ground or up a flight of steps.  No orthopnea/PND.  No chest pain.  No palpitations.  Weight is down 3 lbs.   ECG (11/16): NSR, narrow QRS, nonspecific T wave flattening  Labs (3/17): K 3.7, creatinine 0.84, SPEP negative, TSH normal.  Labs (5/17): K 3.9, creatinine 0.81, BNP normal  PMH: 1. Chronic systolic CHF: 1st noted in 2011.  Nonischemic cardiomyopathy.  - LHC (2011) with no significant CAD, EF 25%.   - Echo (11/16) with EF 30-35%, diffuse hypokinesis. - Echo (2/17) with EF 30-35%, moderate LVH, diffuse hypokinesis.  2. HTN 3. Hypothyroidism 4. Obesity.   SH: 2 sons with muscular dystrophy (one passed away).  Nonsmoker.  Rare ETOH.  Taking classes at [employer].   FH: Mother, grandmother with HTN.  Uncle with MI.   ROS: All systems reviewed and negative except as per HPI.   Current Outpatient Prescriptions  Medication Sig Dispense Refill  . aspirin 81 MG chewable tablet Chew 1 tablet (81 mg total) by  mouth daily. 30 tablet 1  . carvedilol (COREG) 25 MG tablet Take 1 tablet (25 mg total) by mouth 2 (two) times daily with a meal. 60 tablet 11  . furosemide (LASIX) 40 MG tablet Take 1 tablet (40 mg total) by mouth daily. 30 tablet 11  . PROAIR HFA 108 (90 Base) MCG/ACT inhaler Inhale 2 puffs into the lungs 4 (four) times daily as needed. (SHORTNESS OF BREATH/WHEEZING)  0  . sacubitril-valsartan (ENTRESTO) 49-51 MG Take 1 tablet by mouth 2 (two) times daily. 60 tablet 10  . spironolactone (ALDACTONE) 25 MG tablet Take 1 tablet (25 mg total) by mouth daily. 30 tablet 3   No current facility-administered medications for this encounter.   BP 100/64 mmHg  Pulse 67  Resp 18  Wt 261 lb 8 oz (118.616 kg)  SpO2 98% General: NAD, obese.  Neck: No JVD, thyroid appears full without nodularity.  Lungs: Clear to auscultation bilaterally with normal respiratory effort. CV: Nondisplaced PMI.  Heart regular S1/S2, no S3/S4, no murmur.  No peripheral edema.  No carotid bruit.  Normal pedal pulses.  Abdomen: Soft, nontender, no hepatosplenomegaly, no distention.  Skin: Intact without lesions or rashes.  Neurologic: Alert and oriented x 3.  Psych: Normal affect. Extremities: No clubbing or cyanosis.  HEENT: Normal.   Assessment/Plan: 1. Chronic systolic CHF: Nonischemic cardiomyopathy.  Suspect hypertensive cardiomyopathy.  EF improved in  the past with BP control.  No family history of cardiomyopathy.  No CAD on coronary angiography in 2011.  Most recent echo in 2/17 with EF 30-35%.  She is not volume overloaded on exam.  NYHA class I-II symptoms. - Continue current Lasix.    - Continue current Coreg, spironolactone, and Entresto.  - I do not think that she has BP room to increase Entresto or add Bidil.   - I will arrange for cardiac MRI in 6/17.  This will allow for assessment for infiltrative disease.  If EF remains low, will need to consider ICD => even though she has a nonischemic cardiomyopathy and  benefit is not as marked, young age would move me towards recommending ICD. She is not a CRT candidate in absence of LBBB.   2. HTN: BP actually on the low side now, I do not think that she will tolerate uptitration of meds today.  Upon questioning, she does not screen positive for OSA.   3. H/o hypothyroidism: No thyroid replacement.  Still awaiting endocrine visit. 4. Obesity: We discussed diet and exercise for weight loss again.    Followup in 6 wks after MRI.    Loralie Champagne 44/05/2016

## 2015-11-26 ENCOUNTER — Telehealth: Payer: Self-pay | Admitting: Interventional Cardiology

## 2015-11-26 DIAGNOSIS — E039 Hypothyroidism, unspecified: Secondary | ICD-10-CM

## 2015-11-26 NOTE — Telephone Encounter (Signed)
New message     Patient returning call back to nurse from Friday - does not know the nature of the call

## 2015-11-27 NOTE — Telephone Encounter (Signed)
Called to give pt lab results. Lmom. Labs are stable no changes. Pt is to callback if any questions or concerns

## 2015-11-29 ENCOUNTER — Telehealth: Payer: Self-pay | Admitting: Interventional Cardiology

## 2015-11-29 NOTE — Telephone Encounter (Signed)
11-29-15 Lvm to call regarding Appointment with Dr. Jeanann Lewandowsky 12-19-15 @ 11:00/saf

## 2015-12-03 ENCOUNTER — Telehealth: Payer: Self-pay | Admitting: Interventional Cardiology

## 2015-12-03 NOTE — Telephone Encounter (Signed)
New Message:   Pt called in requesting that a referral be sent to Dr. Juanda Bond. [place]'s office because she does not care for Dr. Buddy Duty( the doctor referred by Dr. Tamala Julian). She did not have the fax number but the number to the office is [phone]. Please f/u with her.

## 2015-12-03 NOTE — Telephone Encounter (Signed)
Tried to call pt back x3 "message sts due to network difficulty your call cannot be completed at this time"  Pt has a New Pt appt scheduled with Dr.Clark (Endocrinology) on 6/17

## 2015-12-18 ENCOUNTER — Telehealth: Payer: Self-pay | Admitting: Cardiology

## 2015-12-18 ENCOUNTER — Encounter: Payer: Self-pay | Admitting: Cardiology

## 2015-12-18 NOTE — Telephone Encounter (Signed)
Called the patient on mobile phone and left a voicemail regarding her cardiac MRI.  Letter mailed.  Left my number if she had questions.

## 2016-01-01 ENCOUNTER — Ambulatory Visit (HOSPITAL_COMMUNITY): Admission: RE | Admit: 2016-01-01 | Source: Ambulatory Visit

## 2016-01-04 ENCOUNTER — Ambulatory Visit (HOSPITAL_COMMUNITY)
Admission: RE | Admit: 2016-01-04 | Discharge: 2016-01-04 | Disposition: A | Discharge status: Home / Self Care | Source: Ambulatory Visit | Attending: Cardiology | Admitting: Cardiology

## 2016-01-04 ENCOUNTER — Encounter (HOSPITAL_COMMUNITY): Payer: Self-pay

## 2016-01-04 VITALS — BP 98/64 | HR 76 | Resp 18 | Wt 262.5 lb

## 2016-01-04 DIAGNOSIS — Z6839 Body mass index (BMI) 39.0-39.9, adult: Secondary | ICD-10-CM | POA: Insufficient documentation

## 2016-01-04 DIAGNOSIS — I110 Hypertensive heart disease with heart failure: Secondary | ICD-10-CM | POA: Diagnosis not present

## 2016-01-04 DIAGNOSIS — E039 Hypothyroidism, unspecified: Secondary | ICD-10-CM | POA: Insufficient documentation

## 2016-01-04 DIAGNOSIS — I429 Cardiomyopathy, unspecified: Secondary | ICD-10-CM | POA: Diagnosis not present

## 2016-01-04 DIAGNOSIS — E669 Obesity, unspecified: Secondary | ICD-10-CM | POA: Insufficient documentation

## 2016-01-04 DIAGNOSIS — I5022 Chronic systolic (congestive) heart failure: Secondary | ICD-10-CM | POA: Diagnosis present

## 2016-01-04 DIAGNOSIS — Z79899 Other long term (current) drug therapy: Secondary | ICD-10-CM | POA: Insufficient documentation

## 2016-01-04 DIAGNOSIS — Z7982 Long term (current) use of aspirin: Secondary | ICD-10-CM | POA: Insufficient documentation

## 2016-01-04 DIAGNOSIS — Z8249 Family history of ischemic heart disease and other diseases of the circulatory system: Secondary | ICD-10-CM | POA: Diagnosis not present

## 2016-01-04 NOTE — Patient Instructions (Signed)
Will schedule you for an echocardiogram at [facility]. Address: [address] ([place]), [place], [place] [address]  Phone: [phone]  Will refer you to Endocrinology for Hypothyroidism. Address: [address] [place], [place] [address]  Hours: 8am-5pm Monday-Friday Phone: [phone]  Follow up 3 months with Dr. Aundra Dubin.  Do the following things EVERYDAY: 1) Weigh yourself in the morning before breakfast. Write it down and keep it in a log. 2) Take your medicines as prescribed 3) Eat low salt foods-Limit salt (sodium) to 2000 mg per day.  4) Stay as active as you can everyday 5) Limit all fluids for the day to less than 2 liters

## 2016-01-05 NOTE — Progress Notes (Signed)
Patient ID: Suzanne Long, female   DOB: 1971/01/25, 45 y.o.   MRN: [id number] PCP: [facility]ie [facility] HF Cardiology: Dr. Aundra Dubin  45 yo with history of HTN and nonischemic cardiomyopathy presents for CHF clinic evaluation.  I am seeing her for the first time today and have reviewed her old records.    She was diagnosed with CHF initially in 2011.  At that time, she was hospitalized with volume overload.  Coronary angiography in 2011 showed no significant CAD, but EF was about 25%.  BP was very high.  She was started on cardiac meds and BP improved.  Per her report, EF improved.  She eventually stopped all her medications.  She was then hospitalized in 11/16 with CHF exacerbation. EF was 30-35% on 11/16 echo and 30-35% on 2/17 echo.     She is taking all her cardiac medications now.  BP is running considerably lower than in the past, SBP 90s-100s. No lightheadedness or syncope.  She is feeling better overall.  No dyspnea walking on flat ground or up a flight of steps.  No orthopnea/PND.  No chest pain.  No palpitations. She is going to the gym 4-5 times/week and walks on treadmill for 20-30 minutes.     ECG (11/16): NSR, narrow QRS, nonspecific T wave flattening  Labs (3/17): K 3.7, creatinine 0.84, SPEP negative, TSH normal.  Labs (5/17): K 3.9, creatinine 0.81, BNP normal  PMH: 1. Chronic systolic CHF: 1st noted in 2011.  Nonischemic cardiomyopathy.  - LHC (2011) with no significant CAD, EF 25%.   - Echo (11/16) with EF 30-35%, diffuse hypokinesis. - Echo (2/17) with EF 30-35%, moderate LVH, diffuse hypokinesis.  2. HTN 3. Hypothyroidism 4. Obesity.   SH: 2 sons with muscular dystrophy (one passed away).  Nonsmoker.  Rare ETOH.  Taking classes at [employer].   FH: Mother, grandmother with HTN.  Uncle with MI.   ROS: All systems reviewed and negative except as per HPI.   Current Outpatient Prescriptions  Medication Sig Dispense Refill  . aspirin 81 MG chewable tablet Chew 1 tablet (81 mg total) by  mouth daily. 30 tablet 1  . carvedilol (COREG) 25 MG tablet Take 1 tablet (25 mg total) by mouth 2 (two) times daily with a meal. 60 tablet 11  . furosemide (LASIX) 40 MG tablet Take 1 tablet (40 mg total) by mouth daily. 30 tablet 11  . PROAIR HFA 108 (90 Base) MCG/ACT inhaler Inhale 2 puffs into the lungs 4 (four) times daily as needed. (SHORTNESS OF BREATH/WHEEZING)  0  . sacubitril-valsartan (ENTRESTO) 49-51 MG Take 1 tablet by mouth 2 (two) times daily. 60 tablet 10  . spironolactone (ALDACTONE) 25 MG tablet Take 1 tablet (25 mg total) by mouth daily. 30 tablet 3   No current facility-administered medications for this encounter.   BP 98/64 mmHg  Pulse 76  Resp 18  Wt 262 lb 8 oz (119.069 kg)  SpO2 98% General: NAD, obese.  Neck: No JVD, thyroid appears full without nodularity.  Lungs: Clear to auscultation bilaterally with normal respiratory effort. CV: Nondisplaced PMI.  Heart regular S1/S2, no S3/S4, no murmur.  No peripheral edema.  No carotid bruit.  Normal pedal pulses.  Abdomen: Soft, nontender, no hepatosplenomegaly, no distention.  Skin: Intact without lesions or rashes.  Neurologic: Alert and oriented x 3.  Psych: Normal affect. Extremities: No clubbing or cyanosis.  HEENT: Normal.   Assessment/Plan: 1. Chronic systolic CHF: Nonischemic cardiomyopathy.  Suspect hypertensive cardiomyopathy.  EF improved in  the past with BP control.  No family history of cardiomyopathy.  No CAD on coronary angiography in 2011.  Most recent echo in 2/17 with EF 30-35%.  She is not volume overloaded on exam.  NYHA class I-II symptoms. - Continue current Lasix.    - Continue current Coreg, spironolactone, and Entresto.  - I do not think that she has BP room to increase Entresto or add Bidil.   - I had set her up for an MRI but copay was too high.  Therefore, will repeat echo to look for improvement.  Even though she has a nonischemic cardiomyopathy and benefit is not as marked, young age would  move me towards recommending ICD if EF remains low. She is not a CRT candidate in absence of LBBB.   2. HTN: BP actually on the low side now, I do not think that she will tolerate uptitration of meds today.  Upon questioning, she does not screen positive for OSA.   3. H/o hypothyroidism: No thyroid replacement. ng Still awaiti endocrine visit => will refer to Dr Cruzita Lederer. 4. Obesity: We discussed diet and exercise for weight loss again.    Followup in 3 months.   Loralie Champagne 01/05/2016

## 2016-01-22 ENCOUNTER — Ambulatory Visit (HOSPITAL_COMMUNITY): Attending: Cardiology

## 2016-01-22 ENCOUNTER — Other Ambulatory Visit: Payer: Self-pay

## 2016-01-22 ENCOUNTER — Encounter (INDEPENDENT_AMBULATORY_CARE_PROVIDER_SITE_OTHER): Payer: Self-pay

## 2016-01-22 DIAGNOSIS — I5022 Chronic systolic (congestive) heart failure: Secondary | ICD-10-CM | POA: Diagnosis not present

## 2016-01-22 DIAGNOSIS — I313 Pericardial effusion (noninflammatory): Secondary | ICD-10-CM | POA: Diagnosis not present

## 2016-01-22 DIAGNOSIS — I110 Hypertensive heart disease with heart failure: Secondary | ICD-10-CM | POA: Insufficient documentation

## 2016-01-22 DIAGNOSIS — I371 Nonrheumatic pulmonary valve insufficiency: Secondary | ICD-10-CM | POA: Insufficient documentation

## 2016-01-22 DIAGNOSIS — I509 Heart failure, unspecified: Secondary | ICD-10-CM | POA: Diagnosis present

## 2016-01-22 DIAGNOSIS — Z6839 Body mass index (BMI) 39.0-39.9, adult: Secondary | ICD-10-CM | POA: Diagnosis not present

## 2016-01-22 LAB — ECHOCARDIOGRAM COMPLETE
E decel time: 185 msec
E/e' ratio: 11.25
FS: 28 % (ref 28–44)
IVS/LV PW RATIO, ED: 1.1
LA ID, A-P, ES: 45 mm
LA diam end sys: 45 mm
LA diam index: 1.84 cm/m2
LA vol A4C: 66 ml
LA vol index: 25.3 mL/m2
LA vol: 62 mL
LV E/e' medial: 11.25
LV E/e'average: 11.25
LV PW d: 8.11 mm — AB (ref 0.6–1.1)
LV dias vol index: 43 mL/m2
LV dias vol: 106 mL (ref 46–106)
LV e' LATERAL: 5.59 cm/s
LV sys vol index: 21 mL/m2
LV sys vol: 52 mL — AB (ref 14–42)
LVOT SV: 74 mL
LVOT VTI: 23.5 cm
LVOT area: 3.14 cm2
LVOT diameter: 20 mm
LVOT peak vel: 95.8 cm/s
Lateral S' vel: 11.6 cm/s
MV Dec: 185
MV pk A vel: 56.5 m/s
MV pk E vel: 62.9 m/s
Simpson's disk: 51
Stroke v: 54 ml
TDI e' lateral: 5.59
TDI e' medial: 5.51

## 2016-02-01 ENCOUNTER — Ambulatory Visit (INDEPENDENT_AMBULATORY_CARE_PROVIDER_SITE_OTHER): Admitting: Endocrinology

## 2016-02-01 ENCOUNTER — Encounter: Payer: Self-pay | Admitting: Endocrinology

## 2016-02-01 VITALS — BP 100/68 | HR 67 | Temp 98.7°F | Ht 69.0 in | Wt 264.5 lb

## 2016-02-01 DIAGNOSIS — E049 Nontoxic goiter, unspecified: Secondary | ICD-10-CM | POA: Diagnosis not present

## 2016-02-01 NOTE — Progress Notes (Signed)
Pre visit review using our clinic review tool, if applicable. No additional management support is needed unless otherwise documented below in the visit note. 

## 2016-02-01 NOTE — Progress Notes (Signed)
Subjective:    Patient ID: Suzanne Long, female    DOB: 11-27-1970, 45 y.o.   MRN: [id number]  HPI Pt reports hypothyroidism was dx'ed in 2011.  She took synthroid for a a brief time, but not recently.  she takes some type of non-prescribed thyroid product, but does not recall the name.  She is not considering a pregnancy.  He has never had thyroid surgery, or XRT to the neck.  He has never been on amiodarone or lithium.  She has mild cramps in the legs, and assoc weight gain.  Past Medical History:  Diagnosis Date  . CHF (congestive heart failure) ([place])   . Hypertension   . Hypothyroidism   . NICM (nonischemic cardiomyopathy) ([place])    a. Echo 2/17:  moderate LVH, EF 30-35%, diffuse HK, moderate LAE    Past Surgical History:  Procedure Laterality Date  . None      Social History   Social History  . Marital status: Married    Spouse name: [name]  . Number of children: 2  . Years of education: 12   Occupational History  . Not on file.   Social History Main Topics  . Smoking status: Never Smoker  . Smokeless tobacco: Never Used  . Alcohol use 0.0 oz/week     Comment: Occasional  . Drug use: No  . Sexual activity: Not on file   Other Topics Concern  . Not on file   Social History Narrative   Patient is married husband name [name]    Patient has 2 children. Both have past away.    Patient is right handed.    Patient is unemployed    Patient has a high school education        Current Outpatient Prescriptions on File Prior to Visit  Medication Sig Dispense Refill  . aspirin 81 MG chewable tablet Chew 1 tablet (81 mg total) by mouth daily. 30 tablet 1  . carvedilol (COREG) 25 MG tablet Take 1 tablet (25 mg total) by mouth 2 (two) times daily with a meal. 60 tablet 11  . furosemide (LASIX) 40 MG tablet Take 1 tablet (40 mg total) by mouth daily. 30 tablet 11  . PROAIR HFA 108 (90 Base) MCG/ACT inhaler Inhale 2 puffs into the lungs 4 (four) times daily as needed.  (SHORTNESS OF BREATH/WHEEZING)  0  . sacubitril-valsartan (ENTRESTO) 49-51 MG Take 1 tablet by mouth 2 (two) times daily. 60 tablet 10  . spironolactone (ALDACTONE) 25 MG tablet Take 1 tablet (25 mg total) by mouth daily. 30 tablet 3   No current facility-administered medications on file prior to visit.     Allergies  Allergen Reactions  . Shellfish Allergy Anaphylaxis    Uncoded Allergy. Allergen: SHELLFISH  . Gadolinium Derivatives Nausea And Vomiting    Vomiting after 20cc multihance injection  . Latex Rash    Family History  Problem Relation Age of Onset  . Migraines Neg Hx   . Multiple sclerosis Neg Hx   . Diabetes Mother   . Muscular dystrophy Brother   . Kidney failure Brother   . Muscular dystrophy Son   . Thyroid disease Neg Hx     BP 100/68   Pulse 67   Temp 98.7 F (37.1 C) (Oral)   Ht $R'5\' 9"'dY$  (1.753 m)   Wt 264 lb 8 oz (120 kg)   LMP 01/04/2016   SpO2 98%   BMI 39.06 kg/m     Review of Systems denies  depression, sob, numbness, blurry vision, cold intolerance, rhinorrhea, and syncope.   She has hair loss, constipation, myalgias, dry skin, easy bruising, and fatigue.      Objective:   Physical Exam VS: see vs page GEN: no distress HEAD: head: no deformity eyes: no periorbital swelling, no proptosis external nose and ears are normal mouth: no lesion seen NECK: thyroid is 3-5 times normal size, with irreg surface.  CHEST WALL: no deformity LUNGS: clear to auscultation CV: reg rate and rhythm, no murmur ABD: abdomen is soft, nontender.  no hepatosplenomegaly.  not distended.  no hernia MUSCULOSKELETAL: muscle bulk and strength are grossly normal.  no obvious joint swelling.  gait is normal and steady EXTEMITIES: no deformity.  no ulcer on the feet.  feet are of normal color and temp.  no edema PULSES: dorsalis pedis intact bilat.  no carotid bruit NEURO:  cn 2-12 grossly intact.   readily moves all 4's.  sensation is intact to touch on the feet SKIN:   Normal texture and temperature.  No rash or suspicious lesion is visible.   NODES:  None palpable at the neck PSYCH: alert, well-oriented.  Does not appear anxious nor depressed.     Lab Results  Component Value Date   TSH 1.429 10/11/2015   Thyroid US (2011): There is thyromegaly.  The right lobe is slightly larger than the Left.  The total volume of the thyroid has increased slightly since the study of 11/30/2008 from 23.8 ml to 27.1 ml. No focal thyroid lesion or mass is evident.  I have reviewed outside records, and summarized: Pt was noted to have goiter, and referred here.  She was also noted to have doe     Assessment & Plan:  Goiter, new to me, uncertain etiology.  Euthyroid.  Doe, noted by cardiol.  Goiter is not big enough to cause this.    Patient is advised the following: Patient Instructions  No thyroid medication is needed now.  Let's recheck the ultrasound.  you will receive a phone call, about a day and time for an appointment.  Please return in 1 year.

## 2016-02-01 NOTE — Patient Instructions (Addendum)
No thyroid medication is needed now.  Let's recheck the ultrasound.  you will receive a phone call, about a day and time for an appointment.  Please return in 1 year.

## 2016-02-05 ENCOUNTER — Ambulatory Visit
Admission: RE | Admit: 2016-02-05 | Discharge: 2016-02-05 | Disposition: A | Discharge status: Home / Self Care | Source: Ambulatory Visit | Attending: Endocrinology | Admitting: Endocrinology

## 2016-02-05 DIAGNOSIS — E049 Nontoxic goiter, unspecified: Secondary | ICD-10-CM

## 2016-02-25 ENCOUNTER — Other Ambulatory Visit (HOSPITAL_COMMUNITY): Payer: Self-pay | Admitting: Cardiology

## 2016-03-21 ENCOUNTER — Ambulatory Visit (INDEPENDENT_AMBULATORY_CARE_PROVIDER_SITE_OTHER): Admitting: Interventional Cardiology

## 2016-03-21 ENCOUNTER — Encounter: Payer: Self-pay | Admitting: Interventional Cardiology

## 2016-03-21 VITALS — BP 100/72 | HR 56 | Ht 68.5 in | Wt 264.8 lb

## 2016-03-21 DIAGNOSIS — I10 Essential (primary) hypertension: Secondary | ICD-10-CM

## 2016-03-21 DIAGNOSIS — I5043 Acute on chronic combined systolic (congestive) and diastolic (congestive) heart failure: Secondary | ICD-10-CM | POA: Diagnosis not present

## 2016-03-21 NOTE — Patient Instructions (Addendum)
Your physician recommends that you continue on your current medications as directed. Please refer to the Current Medication list given to you today.  Your physician recommends that you return for lab work in: November, 2018 (BMET).  Your physician wants you to follow-up in: 6-8 months with Dr. Tamala Julian.  You will receive a reminder letter in the mail two months in advance. If you don't receive a letter, please call our office to schedule the follow-up appointment.

## 2016-03-21 NOTE — Progress Notes (Signed)
Cardiology Office Note    Date:  12/17/4401   ID:  Suzanne Long, DOB [birth year], MRN [id number]  PCP:  REDMON,NOELLE, PA-C  Cardiologist: Sinclair Grooms, MD   Chief Complaint  Patient presents with  . Congestive Heart Failure    History of Present Illness:  Suzanne Long is a 45 y.o. female who presents for follow-up of nonischemic cardiomyopathy. Chronic LVEF in the 40% range more recently. Heart is still significantly dilated. She is asymptomatic.  She denies medication side effects. She is concentrating on weight loss. She has fleeting left chest discomfort. Widely patent coronaries were documented at the time of her diagnosis several years ago. She has no exertional component.    Past Medical History:  Diagnosis Date  . CHF (congestive heart failure) ([place])   . Hypertension   . Hypothyroidism   . NICM (nonischemic cardiomyopathy) ([place])    a. Echo 2/17:  moderate LVH, EF 30-35%, diffuse HK, moderate LAE    Past Surgical History:  Procedure Laterality Date  . None      Current Medications: Outpatient Medications Prior to Visit  Medication Sig Dispense Refill  . aspirin 81 MG chewable tablet Chew 1 tablet (81 mg total) by mouth daily. 30 tablet 1  . carvedilol (COREG) 25 MG tablet Take 1 tablet (25 mg total) by mouth 2 (two) times daily with a meal. 60 tablet 11  . furosemide (LASIX) 40 MG tablet Take 1 tablet (40 mg total) by mouth daily. 30 tablet 11  . PROAIR HFA 108 (90 Base) MCG/ACT inhaler Inhale 2 puffs into the lungs 4 (four) times daily as needed. (SHORTNESS OF BREATH/WHEEZING)  0  . sacubitril-valsartan (ENTRESTO) 49-51 MG Take 1 tablet by mouth 2 (two) times daily. 60 tablet 10  . spironolactone (ALDACTONE) 25 MG tablet TAKE 1 TABLET BY MOUTH DAILY 30 tablet 3   No facility-administered medications prior to visit.      Allergies:   Shellfish allergy; Gadolinium derivatives; Iodine; and Latex   Social History   Social History  . Marital status:  Married    Spouse name: [name]  . Number of children: 2  . Years of education: 3   Social History Main Topics  . Smoking status: Never Smoker  . Smokeless tobacco: Never Used  . Alcohol use 0.0 oz/week     Comment: Occasional  . Drug use: No  . Sexual activity: Not Asked   Other Topics Concern  . None   Social History Narrative   Patient is married husband name Danny    Patient has 2 children. Both have past away.    Patient is right handed.    Patient is unemployed    Patient has a high school education         Family History:  The patient's family history includes Diabetes in her mother; Kidney failure in her brother; Muscular dystrophy in her brother and son.   ROS:   Please see the history of present illness.    Thyroid quarter saw Dr.Kerr.  All other systems reviewed and are negative.   PHYSICAL EXAM:   VS:  BP 100/72   Pulse (!) 56   Ht 5' 8.5" (1.74 m)   Wt 264 lb 12.8 oz (120.1 kg)   BMI 39.68 kg/m    GEN: Well nourished, well developed, in no acute distress  HEENT: normal  Neck: no JVD, carotid bruits, or masses Cardiac: RRR; no murmurs, rubs, or gallops,no edema  Respiratory:  clear to  auscultation bilaterally, normal work of breathing GI: soft, nontender, nondistended, + BS MS: no deformity or atrophy  Skin: warm and dry, no rash Neuro:  Alert and Oriented x 3, Strength and sensation are intact Psych: euthymic mood, full affect  Wt Readings from Last 3 Encounters:  03/21/16 264 lb 12.8 oz (120.1 kg)  02/01/16 264 lb 8 oz (120 kg)  01/04/16 262 lb 8 oz (119.1 kg)      Studies/Labs Reviewed:   EKG:  EKG  Sinus bradycardia, nonspecific T-wave abnormality. No significant change compared to prior.  Recent Labs: 05/30/2015: ALT 31; Hemoglobin 13.5; Platelets 282 10/11/2015: TSH 1.429 11/19/2015: Brain Natriuretic Peptide 82.5; BUN 11; Creat 0.81; Potassium 3.9; Sodium 138   Lipid Panel    Component Value Date/Time   CHOL  06/02/2010 0515    121         ATP III CLASSIFICATION:  <200     mg/dL   Desirable  200-239  mg/dL   Borderline High  >=240    mg/dL   High          TRIG 29 06/02/2010 0515   HDL 36 (L) 06/02/2010 0515   CHOLHDL 3.4 06/02/2010 0515   VLDL 6 06/02/2010 0515   LDLCALC  06/02/2010 0515    79        Total Cholesterol/HDL:CHD Risk Coronary Heart Disease Risk Table                     Men   Women  1/2 Average Risk   3.4   3.3  Average Risk       5.0   4.4  2 X Average Risk   9.6   7.1  3 X Average Risk  23.4   11.0        Use the calculated Patient Ratio above and the CHD Risk Table to determine the patient's CHD Risk.        ATP III CLASSIFICATION (LDL):  <100     mg/dL   Optimal  100-129  mg/dL   Near or Above                    Optimal  130-159  mg/dL   Borderline  160-189  mg/dL   High  >190     mg/dL   Very High    Additional studies/ records that were reviewed today include:  Echocardiogram 01/2016: Study Conclusions  - Left ventricle: The cavity size was severely dilated. Systolic   function was mildly to moderately reduced. The estimated ejection   fraction was 40%. Diffuse hypokinesis. Features are consistent   with a pseudonormal left ventricular filling pattern, with   concomitant abnormal relaxation and increased filling pressure   (grade 2 diastolic dysfunction). - Pulmonic valve: There was trivial regurgitation. - Pericardium, extracardiac: A trivial pericardial effusion was   identified posterior to the heart.    ASSESSMENT:    1. Acute on chronic combined systolic and diastolic congestive heart failure ([place])   2. Essential hypertension   3. Morbid obesity, unspecified obesity type ([place])      PLAN:  In order of problems listed above:  1. No evidence of volume overload. Very clinically stable. Tolerating medical regimen without difficulty. No change in therapy is indicated at this time. 2. Excellent blood pressure control now on her current regimen. We need to perform  basic metabolic panel at least twice a year on spironolactone and furosemide combination. Next occasion will  be in November. 3. I encouraged aerobic activity and attempts at weight reduction.    Medication Adjustments/Labs and Tests Ordered: Current medicines are reviewed at length with the patient today.  Concerns regarding medicines are outlined above.  Medication changes, Labs and Tests ordered today are listed in the Patient Instructions below. There are no Patient Instructions on file for this visit.   Signed, Sinclair Grooms, MD  03/21/2016 9:23 AM    [address], [place], [place]  [address] Phone: [phone]; Fax: [phone]

## 2016-04-09 ENCOUNTER — Ambulatory Visit (HOSPITAL_COMMUNITY)
Admission: RE | Admit: 2016-04-09 | Discharge: 2016-04-09 | Disposition: A | Discharge status: Home / Self Care | Source: Ambulatory Visit | Attending: Cardiology | Admitting: Cardiology

## 2016-04-09 ENCOUNTER — Encounter (HOSPITAL_COMMUNITY): Payer: Self-pay

## 2016-04-09 VITALS — BP 108/68 | HR 71 | Wt 266.5 lb

## 2016-04-09 DIAGNOSIS — E669 Obesity, unspecified: Secondary | ICD-10-CM | POA: Diagnosis not present

## 2016-04-09 DIAGNOSIS — E039 Hypothyroidism, unspecified: Secondary | ICD-10-CM | POA: Insufficient documentation

## 2016-04-09 DIAGNOSIS — I5022 Chronic systolic (congestive) heart failure: Secondary | ICD-10-CM | POA: Insufficient documentation

## 2016-04-09 DIAGNOSIS — I10 Essential (primary) hypertension: Secondary | ICD-10-CM

## 2016-04-09 DIAGNOSIS — I110 Hypertensive heart disease with heart failure: Secondary | ICD-10-CM | POA: Insufficient documentation

## 2016-04-09 DIAGNOSIS — Z7982 Long term (current) use of aspirin: Secondary | ICD-10-CM | POA: Diagnosis not present

## 2016-04-09 DIAGNOSIS — Z8249 Family history of ischemic heart disease and other diseases of the circulatory system: Secondary | ICD-10-CM | POA: Diagnosis not present

## 2016-04-09 DIAGNOSIS — Z6839 Body mass index (BMI) 39.0-39.9, adult: Secondary | ICD-10-CM | POA: Diagnosis not present

## 2016-04-09 DIAGNOSIS — I429 Cardiomyopathy, unspecified: Secondary | ICD-10-CM | POA: Diagnosis not present

## 2016-04-09 LAB — BASIC METABOLIC PANEL
Anion gap: 9 (ref 5–15)
BUN: 14 mg/dL (ref 6–20)
CO2: 26 mmol/L (ref 22–32)
Calcium: 9.7 mg/dL (ref 8.9–10.3)
Chloride: 104 mmol/L (ref 101–111)
Creatinine, Ser: 1.07 mg/dL — ABNORMAL HIGH (ref 0.44–1.00)
GFR calc Af Amer: >60 mL/min (ref >60)
GFR calc non Af Amer: >60 mL/min (ref >60)
Glucose, Bld: 99 mg/dL (ref 65–99)
Potassium: 4.2 mmol/L (ref 3.5–5.1)
Sodium: 139 mmol/L (ref 135–145)

## 2016-04-09 NOTE — Patient Instructions (Signed)
Lab today  We will contact you in 6 months to schedule your next appointment.  

## 2016-04-09 NOTE — Progress Notes (Signed)
Patient ID: Felisa Zechman, female   DOB: 09-Mar-1971, 45 y.o.   MRN: [id number] PCP: [facility] Height HF Cardiology: Dr. Shirlee Latch  45 yo with history of HTN and nonischemic cardiomyopathy presents for CHF clinic evaluation.  I am seeing her for the first time today and have reviewed her old records.    She was diagnosed with CHF initially in 2011.  At that time, she was hospitalized with volume overload.  Coronary angiography in 2011 showed no significant CAD, but EF was about 25%.  BP was very high.  She was started on cardiac meds and BP improved.  Per her report, EF improved.  She eventually stopped all her medications.  She was then hospitalized in 11/16 with CHF exacerbation. EF was 30-35% on 11/16 echo and 30-35% on 2/17 echo.   Echo in 7/17 showed EF up to 40%.   BP is controlled now. No lightheadedness or syncope.  Some generalized fatigue.  No dyspnea walking on flat ground or up a flight of steps.  No orthopnea/PND.  No chest pain.  No palpitations. Not exercising as much and weight up 4 lbs.   Labs (3/17): K 3.7, creatinine 0.84, SPEP negative, TSH normal.  Labs (5/17): K 3.9, creatinine 0.81, BNP normal  PMH: 1. Chronic systolic CHF: 1st noted in 2011.  Nonischemic cardiomyopathy.  - LHC (2011) with no significant CAD, EF 25%.   - Echo (11/16) with EF 30-35%, diffuse hypokinesis. - Echo (2/17) with EF 30-35%, moderate LVH, diffuse hypokinesis.  - Echo (7/17) with EF 40%, severe LV dilation, grade II diastolic dysfunction.  2. HTN 3. Hypothyroidism 4. Obesity.  5. Goiter  SH: 2 sons with muscular dystrophy (one passed away).  Nonsmoker.  Rare ETOH.  Taking classes at [employer].   FH: Mother, grandmother with HTN.  Uncle with MI.   ROS: All systems reviewed and negative except as per HPI.   Current Outpatient Prescriptions  Medication Sig Dispense Refill  . aspirin 81 MG chewable tablet Chew 1 tablet (81 mg total) by mouth daily. 30 tablet 1  . carvedilol (COREG) 25 MG tablet Take 1  tablet (25 mg total) by mouth 2 (two) times daily with a meal. 60 tablet 11  . furosemide (LASIX) 40 MG tablet Take 1 tablet (40 mg total) by mouth daily. 30 tablet 11  . PROAIR HFA 108 (90 Base) MCG/ACT inhaler Inhale 2 puffs into the lungs 4 (four) times daily as needed. (SHORTNESS OF BREATH/WHEEZING)  0  . sacubitril-valsartan (ENTRESTO) 49-51 MG Take 1 tablet by mouth 2 (two) times daily. 60 tablet 10  . spironolactone (ALDACTONE) 25 MG tablet TAKE 1 TABLET BY MOUTH DAILY 30 tablet 3   No current facility-administered medications for this encounter.    BP 108/68   Pulse 71   Wt 266 lb 8 oz (120.9 kg)   SpO2 99%   BMI 39.93 kg/m  General: NAD, obese.  Neck: No JVD, thyroid appears full without nodularity.  Lungs: Clear to auscultation bilaterally with normal respiratory effort. CV: Nondisplaced PMI.  Heart regular S1/S2, no S3/S4, no murmur.  No peripheral edema.  No carotid bruit.  Normal pedal pulses.  Abdomen: Soft, nontender, no hepatosplenomegaly, no distention.  Skin: Intact without lesions or rashes.  Neurologic: Alert and oriented x 3.  Psych: Normal affect. Extremities: No clubbing or cyanosis.  HEENT: Normal.   Assessment/Plan: 1. Chronic systolic CHF: Nonischemic cardiomyopathy.  Suspect hypertensive cardiomyopathy.  EF improved in the past with BP control.  No family history  of cardiomyopathy.  No CAD on coronary angiography in 2011.  Most recent echo in 7/17 showed EF up to 40%.  She is not volume overloaded on exam.  NYHA class I-II symptoms. - Continue current Lasix.  BMET today.  - Continue current Coreg, spironolactone, and Entresto.  - I do not think that she has BP room to increase Entresto or add Bidil.   - We talked about adding Bidil but BP is soft.  She will check BP daily x 2 weeks and call in readings.  - EF is outside the range for ICD.  2. HTN: Now controlled.    3. H/o hypothyroidism: No thyroid replacement. Following with endocrinology for goiter. 4.  Obesity: We discussed diet and exercise for weight loss again.    Followup in 6 months.   Loralie Champagne 04/09/2016

## 2016-05-21 ENCOUNTER — Other Ambulatory Visit

## 2016-07-01 ENCOUNTER — Other Ambulatory Visit: Payer: Self-pay | Admitting: Physician Assistant

## 2016-07-01 DIAGNOSIS — I428 Other cardiomyopathies: Secondary | ICD-10-CM

## 2016-07-18 ENCOUNTER — Other Ambulatory Visit: Payer: Self-pay | Admitting: Physician Assistant

## 2016-07-18 DIAGNOSIS — I428 Other cardiomyopathies: Secondary | ICD-10-CM

## 2016-12-22 ENCOUNTER — Other Ambulatory Visit: Payer: Self-pay | Admitting: Interventional Cardiology

## 2017-04-06 ENCOUNTER — Other Ambulatory Visit: Payer: Self-pay | Admitting: Physician Assistant

## 2018-01-20 ENCOUNTER — Other Ambulatory Visit: Payer: Self-pay | Admitting: Physician Assistant

## 2018-01-20 ENCOUNTER — Other Ambulatory Visit (HOSPITAL_COMMUNITY): Payer: Self-pay | Admitting: Cardiology

## 2018-01-20 DIAGNOSIS — I428 Other cardiomyopathies: Secondary | ICD-10-CM

## 2018-01-21 NOTE — Telephone Encounter (Signed)
Prescribing Provider Encounter Provider  Liliane Shi, PA-C Liliane Shi, PA-C  Outpatient Medication Detail    Disp Refills Start End   furosemide (LASIX) 40 MG tablet 30 tablet 7 07/18/2016    Sig: take 1 tablet by mouth once daily   Sent to pharmacy as: furosemide (LASIX) 40 MG tablet   E-Prescribing Status: Receipt confirmed by pharmacy (07/18/2016 12:46 PM EST)   Associated Diagnoses   NICM (nonischemic cardiomyopathy) ([place])     Pharmacy   [facility],  - [place]

## 2018-04-15 ENCOUNTER — Other Ambulatory Visit: Payer: Self-pay | Admitting: Physician Assistant

## 2018-04-15 DIAGNOSIS — I428 Other cardiomyopathies: Secondary | ICD-10-CM

## 2018-07-03 ENCOUNTER — Other Ambulatory Visit: Payer: Self-pay | Admitting: Interventional Cardiology

## 2018-07-03 DIAGNOSIS — I428 Other cardiomyopathies: Secondary | ICD-10-CM

## 2018-08-08 ENCOUNTER — Other Ambulatory Visit: Payer: Self-pay | Admitting: Interventional Cardiology

## 2018-08-08 DIAGNOSIS — I428 Other cardiomyopathies: Secondary | ICD-10-CM

## 2018-09-22 ENCOUNTER — Other Ambulatory Visit: Payer: Self-pay

## 2018-09-22 ENCOUNTER — Other Ambulatory Visit (HOSPITAL_COMMUNITY): Payer: Self-pay | Admitting: Cardiology

## 2018-09-22 ENCOUNTER — Telehealth: Payer: Self-pay | Admitting: Interventional Cardiology

## 2018-09-22 DIAGNOSIS — I428 Other cardiomyopathies: Secondary | ICD-10-CM

## 2018-09-22 MED ORDER — CARVEDILOL 25 MG PO TABS: 25.0000 mg | ORAL_TABLET | Freq: Two times a day (BID) | ORAL | 0 refills | Status: DC

## 2018-09-22 MED ORDER — FUROSEMIDE 40 MG PO TABS: 40.0000 mg | ORAL_TABLET | Freq: Every day | ORAL | 0 refills | Status: DC

## 2018-09-22 NOTE — Telephone Encounter (Signed)
Ok to fill Furosemide and Carvedilol.  Looks like Delene Loll is prescribed by Dr. Aundra Dubin.

## 2018-09-22 NOTE — Telephone Encounter (Signed)
Pt has not been seen since 2017. Pt has an upcoming appt on 10/18/18 with Dr. Tamala Julian. Would Dr. Tamala Julian like to refill these medications? Please address

## 2018-09-22 NOTE — Telephone Encounter (Signed)
New Message           *STAT* If patient is at the pharmacy, call can be transferred to refill team.   1. Which medications need to be refilled? (please list name of each medication and dose if known) Carvidilol/Entresto/Furosomide  2. Which pharmacy/location (including street and city if local pharmacy) is medication to be sent to? [facility]  3. Do they need a 30 day or 90 day supply? [place]

## 2018-09-22 NOTE — Telephone Encounter (Signed)
Patient needs a refill on Entresto. Dr. Aundra Dubin prescribes this and she has not been seen since 2017 it looks like.

## 2018-09-23 NOTE — Telephone Encounter (Signed)
Received refill request for entresto. Pt however lost to follow up since 2017.  Pt needs an appointment for future refills.

## 2018-09-24 NOTE — Telephone Encounter (Signed)
Okay to give a refill to last until the office visit.

## 2018-09-30 NOTE — Telephone Encounter (Signed)
Pt is requesting a refill on Entresto. Dr. Aundra Dubin prescribed this medication. Please address

## 2018-10-14 ENCOUNTER — Telehealth: Payer: Self-pay | Admitting: Interventional Cardiology

## 2018-10-14 NOTE — Telephone Encounter (Signed)
Okay to refill meds. Reschedule OV for late summer.

## 2018-10-14 NOTE — Telephone Encounter (Signed)
Attempted to contact pt in regards to appt scheduled 10/18/2018 with Dr. Tamala Julian.  Cell phone went to VM but mailbox was full.  Attempted home number but it has been disconnected.  Will try again later.

## 2018-10-15 MED ORDER — SACUBITRIL-VALSARTAN 49-51 MG PO TABS: 1.0000 | ORAL_TABLET | Freq: Two times a day (BID) | ORAL | 5 refills | Status: DC

## 2018-10-15 NOTE — Telephone Encounter (Signed)
Spoke with pt and she denied any cardiac issues.  Rescheduled pt to August with Dr. Tamala Julian. Pt appreciative for call.

## 2018-10-18 ENCOUNTER — Ambulatory Visit: Admitting: Interventional Cardiology

## 2019-02-11 ENCOUNTER — Telehealth: Payer: Self-pay

## 2019-02-11 NOTE — Telephone Encounter (Signed)
YOUR CARDIOLOGY TEAM HAS ARRANGED FOR AN E-VISIT FOR YOUR APPOINTMENT - PLEASE REVIEW IMPORTANT INFORMATION BELOW SEVERAL DAYS PRIOR TO YOUR APPOINTMENT  Due to the recent COVID-19 pandemic, we are transitioning in-person office visits to tele-medicine visits in an effort to decrease unnecessary exposure to our patients, their families, and staff. These visits are billed to your insurance just like a normal visit is. We also encourage you to sign up for MyChart if you have not already done so. You will need a smartphone if possible. For patients that do not have this, we can still complete the visit using a regular telephone but do prefer a smartphone to enable video when possible. You may have a family member that lives with you that can help. If possible, we also ask that you have a blood pressure cuff and scale at home to measure your blood pressure, heart rate and weight prior to your scheduled appointment. Patients with clinical needs that need an in-person evaluation and testing will still be able to come to the office if absolutely necessary. If you have any questions, feel free to call our office.     YOUR PROVIDER WILL BE USING THE FOLLOWING PLATFORM TO COMPLETE YOUR VISIT: Doximity  . IF USING MYCHART - How to Download the MyChart App to Your SmartPhone   - If Apple, go to App Store and type in MyChart in the search bar and download the app. If Android, ask patient to go to Google Play Store and type in MyChart in the search bar and download the app. The app is free but as with any other app downloads, your phone may require you to verify saved payment information or Apple/Android password.  - You will need to then log into the app with your MyChart username and password, and select [facility] as your healthcare provider to link the account.  - When it is time for your visit, go to the MyChart app, find appointments, and click Begin Video Visit. Be sure to Select Allow for your device to  access the Microphone and Camera for your visit. You will then be connected, and your provider will be with you shortly.  **If you have any issues connecting or need assistance, please contact MyChart service desk [phone] ([phone])**  **If using a computer, in order to ensure the best quality for your visit, you will need to use either of the following Internet Browsers: Google Chrome or Microsoft Edge**  . IF USING DOXIMITY or DOXY.ME - The staff will give you instructions on receiving your link to join the meeting the day of your visit.      2-3 DAYS BEFORE YOUR APPOINTMENT  You will receive a telephone call from one of our Heart[facility] team members - your caller ID may say "Unknown caller." If this is a video visit, we will walk you through how to get the video launched on your phone. We will remind you check your blood pressure, heart rate and weight prior to your scheduled appointment. If you have an Apple Watch or Kardia, please upload any pertinent ECG strips the day before or morning of your appointment to MyChart. Our staff will also make sure you have reviewed the consent and agree to move forward with your scheduled tele-health visit.     THE DAY OF YOUR APPOINTMENT  Approximately 15 minutes prior to your scheduled appointment, you will receive a telephone call from one of HeartCare team - your caller ID may say "Unknown caller."    Our staff will confirm medications, vital signs for the day and any symptoms you may be experiencing. Please have this information available prior to the time of visit start. It may also be helpful for you to have a pad of paper and pen handy for any instructions given during your visit. They will also walk you through joining the smartphone meeting if this is a video visit.    CONSENT FOR TELE-HEALTH VISIT - PLEASE REVIEW  I hereby voluntarily request, consent and authorize [facility] and its employed or contracted physicians, physician  assistants, nurse practitioners or other licensed health care professionals (the Practitioner), to provide me with telemedicine health care services (the "Services") as deemed necessary by the treating Practitioner. I acknowledge and consent to receive the Services by the Practitioner via telemedicine. I understand that the telemedicine visit will involve communicating with the Practitioner through live audiovisual communication technology and the disclosure of certain medical information by electronic transmission. I acknowledge that I have been given the opportunity to request an in-person assessment or other available alternative prior to the telemedicine visit and am voluntarily participating in the telemedicine visit.  I understand that I have the right to withhold or withdraw my consent to the use of telemedicine in the course of my care at any time, without affecting my right to future care or treatment, and that the Practitioner or I may terminate the telemedicine visit at any time. I understand that I have the right to inspect all information obtained and/or recorded in the course of the telemedicine visit and may receive copies of available information for a reasonable fee.  I understand that some of the potential risks of receiving the Services via telemedicine include:  . Delay or interruption in medical evaluation due to technological equipment failure or disruption; . Information transmitted may not be sufficient (e.g. poor resolution of images) to allow for appropriate medical decision making by the Practitioner; and/or  . In rare instances, security protocols could fail, causing a breach of personal health information.  Furthermore, I acknowledge that it is my responsibility to provide information about my medical history, conditions and care that is complete and accurate to the best of my ability. I acknowledge that Practitioner's advice, recommendations, and/or decision may be based on  factors not within their control, such as incomplete or inaccurate data provided by me or distortions of diagnostic images or specimens that may result from electronic transmissions. I understand that the practice of medicine is not an exact science and that Practitioner makes no warranties or guarantees regarding treatment outcomes. I acknowledge that I will receive a copy of this consent concurrently upon execution via email to the email address I last provided but may also request a printed copy by calling the office of [facility].    I understand that my insurance will be billed for this visit.   I have read or had this consent read to me. . I understand the contents of this consent, which adequately explains the benefits and risks of the Services being provided via telemedicine.  . I have been provided ample opportunity to ask questions regarding this consent and the Services and have had my questions answered to my satisfaction. . I give my informed consent for the services to be provided through the use of telemedicine in my medical care  By participating in this telemedicine visit I agree to the above.  

## 2019-02-21 ENCOUNTER — Telehealth: Payer: Self-pay

## 2019-02-21 NOTE — Telephone Encounter (Signed)
Called pt to review meds for 8/12 appt with Dr Tamala Julian. Also, need to move her appt from 8:20 to 8:00.

## 2019-02-22 NOTE — Progress Notes (Signed)
Virtual Visit via Video Note   This visit type was conducted due to national recommendations for restrictions regarding the COVID-19 Pandemic (e.g. social distancing) in an effort to limit this patient's exposure and mitigate transmission in our community.  Due to her co-morbid illnesses, this patient is at least at moderate risk for complications without adequate follow up.  This format is felt to be most appropriate for this patient at this time.  All issues noted in this document were discussed and addressed.  A limited physical exam was performed with this format.  Please refer to the patient's chart for her consent to telehealth for [facility].   Date:  7/67/3419   ID:  Etheleen Nicks, DOB [birth year], MRN [id number]  Patient Location: Home Provider Location: Office  PCP:  Lennie Odor, PA-C  Cardiologist:  Linard Millers  Electrophysiologist:  None   Evaluation Performed:  Follow-Up Visit  Chief Complaint:  Non ischemic CM  History of Present Illness:    Kayti Poss is a 48 y.o. female with  Nonischemic CM and most recent EF 40% 2017.  She has no complaints. She is doing well. Has not had CV f/u since 2017. Says she does not like going to the doctor's office. No voiced limitations.  We have been refilling her meds and she endorses compliance.  The patient does not have symptoms concerning for COVID-19 infection (fever, chills, cough, or new shortness of breath).    Past Medical History:  Diagnosis Date  . CHF (congestive heart failure) ([place])   . Hypertension   . Hypothyroidism   . NICM (nonischemic cardiomyopathy) ([place])    a. Echo 2/17:  moderate LVH, EF 30-35%, diffuse HK, moderate LAE   Past Surgical History:  Procedure Laterality Date  . None       Current Meds  Medication Sig  . aspirin 81 MG chewable tablet Chew 1 tablet (81 mg total) by mouth daily.  . carvedilol (COREG) 25 MG tablet Take 1 tablet (25 mg total) by mouth 2 (two) times daily with a meal.  .  furosemide (LASIX) 40 MG tablet Take 1 tablet (40 mg total) by mouth daily.  Marland Kitchen PROAIR HFA 108 (90 Base) MCG/ACT inhaler Inhale 2 puffs into the lungs 4 (four) times daily as needed. (SHORTNESS OF BREATH/WHEEZING)  . sacubitril-valsartan (ENTRESTO) 49-51 MG Take 1 tablet by mouth 2 (two) times daily.     Allergies:   Shellfish allergy, Gadolinium derivatives, Iodine, and Latex   Social History   Tobacco Use  . Smoking status: Never Smoker  . Smokeless tobacco: Never Used  Substance Use Topics  . Alcohol use: Yes    Alcohol/week: 0.0 standard drinks    Comment: Occasional  . Drug use: No     Family Hx: The patient's family history includes Diabetes in her mother; Kidney failure in her brother; Muscular dystrophy in her brother and son. There is no history of Migraines, Multiple sclerosis, or Thyroid disease.  ROS:   Please see the history of present illness.    Poor diet, increasing weight, and sedentary lifestyle. All other systems reviewed and are negative.   Prior CV studies:   The following studies were reviewed today:  2D Doppler echocardiogram July 2017: Study Conclusions  - Left ventricle: The cavity size was severely dilated. Systolic   function was mildly to moderately reduced. The estimated ejection   fraction was 40%. Diffuse hypokinesis. Features are consistent   with a pseudonormal left ventricular filling pattern, with  concomitant abnormal relaxation and increased filling pressure   (grade 2 diastolic dysfunction). - Pulmonic valve: There was trivial regurgitation. - Pericardium, extracardiac: A trivial pericardial effusion was   identified posterior to the heart.  Labs/Other Tests and Data Reviewed:    EKG:  No ECG reviewed.  Recent Labs: No results found for requested labs within last 8760 hours.   Recent Lipid Panel Lab Results  Component Value Date/Time   CHOL  06/02/2010 05:15 AM    121        ATP III CLASSIFICATION:  <200     mg/dL    Desirable  200-239  mg/dL   Borderline High  >=240    mg/dL   High          TRIG 29 06/02/2010 05:15 AM   HDL 36 (L) 06/02/2010 05:15 AM   CHOLHDL 3.4 06/02/2010 05:15 AM   LDLCALC  06/02/2010 05:15 AM    79        Total Cholesterol/HDL:CHD Risk Coronary Heart Disease Risk Table                     Men   Women  1/2 Average Risk   3.4   3.3  Average Risk       5.0   4.4  2 X Average Risk   9.6   7.1  3 X Average Risk  23.4   11.0        Use the calculated Patient Ratio above and the CHD Risk Table to determine the patient's CHD Risk.        ATP III CLASSIFICATION (LDL):  <100     mg/dL   Optimal  100-129  mg/dL   Near or Above                    Optimal  130-159  mg/dL   Borderline  160-189  mg/dL   High  >190     mg/dL   Very High    Wt Readings from Last 3 Encounters:  04/09/16 266 lb 8 oz (120.9 kg)  03/21/16 264 lb 12.8 oz (120.1 kg)  02/01/16 264 lb 8 oz (120 kg)     Objective:    Vital Signs:  Ht 5' 8.5" (1.74 m)   BMI 39.93 kg/m   Obese VITAL SIGNS:  reviewed GEN:  no acute distress RESPIRATORY:  normal respiratory effort, symmetric expansion CARDIOVASCULAR:  no peripheral edema  ASSESSMENT & PLAN:    1. NICM (nonischemic cardiomyopathy) ([place])   2. Acute on chronic combined systolic and diastolic congestive heart failure ([place])   3. Educated About Covid-19 Virus Infection   4. Morbid obesity ([place])     PLAN:  1. Asymptomatic on medical therapy. LV function mildly reduced when last evaluated. Needs repeat echo. 2. No symptoms to suggest volume overload. 3. WWW discussed as measures to prevent COVID 19 infection 4. Discussed weight loss and increase in physical activity.  Overall education and awareness concerning secondary risk prevention was discussed in detail: LDL less than 70, hemoglobin A1c less than 7, blood pressure target less than 130/80 mmHg, >150 minutes of moderate aerobic activity per week, avoidance of smoking, weight control (via diet  and exercise), and continued surveillance/management of/for obstructive sleep apnea.    COVID-19 Education: The signs and symptoms of COVID-19 were discussed with the patient and how to seek care for testing (follow up with PCP or arrange E-visit).  The importance of social distancing was  discussed today.  Time:   Today, I have spent 15 minutes with the patient with telehealth technology discussing the above problems.     Medication Adjustments/Labs and Tests Ordered: Current medicines are reviewed at length with the patient today.  Concerns regarding medicines are outlined above.   Tests Ordered: Orders Placed This Encounter  Procedures  . Basic metabolic panel  . Hepatic function panel  . HgB A1c  . CBC  . Lipid panel  . ECHOCARDIOGRAM COMPLETE    Medication Changes: No orders of the defined types were placed in this encounter.   Follow Up:  In Person in 7 month(s)  Signed, Sinclair Grooms, MD  02/23/2019 10:57 AM    [place]

## 2019-02-22 NOTE — Telephone Encounter (Signed)
2nd attempt to call pt to review meds for appt with Dr. Tamala Julian on 8/12

## 2019-02-23 ENCOUNTER — Encounter: Payer: Self-pay | Admitting: Interventional Cardiology

## 2019-02-23 ENCOUNTER — Other Ambulatory Visit: Payer: Self-pay

## 2019-02-23 ENCOUNTER — Telehealth (INDEPENDENT_AMBULATORY_CARE_PROVIDER_SITE_OTHER): Admitting: Interventional Cardiology

## 2019-02-23 VITALS — Ht 68.5 in

## 2019-02-23 DIAGNOSIS — I5043 Acute on chronic combined systolic (congestive) and diastolic (congestive) heart failure: Secondary | ICD-10-CM

## 2019-02-23 DIAGNOSIS — I428 Other cardiomyopathies: Secondary | ICD-10-CM | POA: Diagnosis not present

## 2019-02-23 DIAGNOSIS — Z7189 Other specified counseling: Secondary | ICD-10-CM

## 2019-02-23 NOTE — Patient Instructions (Signed)
Medication Instructions:  Your physician recommends that you continue on your current medications as directed. Please refer to the Current Medication list given to you today.  If you need a refill on your cardiac medications before your next appointment, please call your pharmacy.   Lab work: BMET, Lipid, Liver, CBC, A1C when you are fasting (nothing to eat or drink after midnight except water and black coffee). If you have labs (blood work) drawn today and your tests are completely normal, you will receive your results only by: Marland Kitchen MyChart Message (if you have MyChart) OR . A paper copy in the mail If you have any lab test that is abnormal or we need to change your treatment, we will call you to review the results.  Testing/Procedures: Your physician has requested that you have an echocardiogram. Echocardiography is a painless test that uses sound waves to create images of your heart. It provides your doctor with information about the size and shape of your heart and how well your heart's chambers and valves are working. This procedure takes approximately one hour. There are no restrictions for this procedure.   Follow-Up: At [facility], you and your health needs are our priority.  As part of our continuing mission to provide you with exceptional heart care, we have created designated Provider Care Teams.  These Care Teams include your primary Cardiologist (physician) and Advanced Practice Providers (APPs -  Physician Assistants and Nurse Practitioners) who all work together to provide you with the care you need, when you need it. You will need a follow up appointment in 12 months.  Please call our office 2 months in advance to schedule this appointment.  You may see Dr. Tamala Julian or one of the following Advanced Practice Providers on your designated Care Team:   Truitt Merle, NP Cecilie Kicks, NP . Kathyrn Drown, NP  Any Other Special Instructions Will Be Listed Below (If Applicable).

## 2019-03-01 ENCOUNTER — Other Ambulatory Visit

## 2019-03-01 ENCOUNTER — Other Ambulatory Visit (HOSPITAL_COMMUNITY)

## 2019-03-04 ENCOUNTER — Other Ambulatory Visit: Admitting: *Deleted

## 2019-03-04 ENCOUNTER — Other Ambulatory Visit: Payer: Self-pay

## 2019-03-04 ENCOUNTER — Ambulatory Visit (HOSPITAL_COMMUNITY): Attending: Cardiovascular Disease

## 2019-03-04 DIAGNOSIS — I428 Other cardiomyopathies: Secondary | ICD-10-CM

## 2019-03-04 DIAGNOSIS — I5043 Acute on chronic combined systolic (congestive) and diastolic (congestive) heart failure: Secondary | ICD-10-CM

## 2019-03-05 LAB — HEPATIC FUNCTION PANEL
ALT: 16 IU/L (ref 0–32)
AST: 20 IU/L (ref 0–40)
Albumin: 3.7 g/dL — ABNORMAL LOW (ref 3.8–4.8)
Alkaline Phosphatase: 84 IU/L (ref 39–117)
Bilirubin Total: 0.4 mg/dL (ref 0.0–1.2)
Bilirubin, Direct: 0.11 mg/dL (ref 0.00–0.40)
Total Protein: 6.3 g/dL (ref 6.0–8.5)

## 2019-03-05 LAB — BASIC METABOLIC PANEL
BUN/Creatinine Ratio: 10 (ref 9–23)
BUN: 9 mg/dL (ref 6–24)
CO2: 25 mmol/L (ref 20–29)
Calcium: 9.5 mg/dL (ref 8.7–10.2)
Chloride: 100 mmol/L (ref 96–106)
Creatinine, Ser: 0.88 mg/dL (ref 0.57–1.00)
GFR calc Af Amer: 90 mL/min/{1.73_m2} (ref >59)
GFR calc non Af Amer: 78 mL/min/{1.73_m2} (ref >59)
Glucose: 98 mg/dL (ref 65–99)
Potassium: 3.8 mmol/L (ref 3.5–5.2)
Sodium: 140 mmol/L (ref 134–144)

## 2019-03-05 LAB — CBC
Hematocrit: 41.6 % (ref 34.0–46.6)
Hemoglobin: 13.8 g/dL (ref 11.1–15.9)
MCH: 29.9 pg (ref 26.6–33.0)
MCHC: 33.2 g/dL (ref 31.5–35.7)
MCV: 90 fL (ref 79–97)
Platelets: 288 10*3/uL (ref 150–450)
RBC: 4.61 x10E6/uL (ref 3.77–5.28)
RDW: 13.5 % (ref 11.7–15.4)
WBC: 6.4 10*3/uL (ref 3.4–10.8)

## 2019-03-05 LAB — LIPID PANEL
Chol/HDL Ratio: 3.1 ratio (ref 0.0–4.4)
Cholesterol, Total: 158 mg/dL (ref 100–199)
HDL: 51 mg/dL (ref >39)
LDL Calculated: 93 mg/dL (ref 0–99)
Triglycerides: 69 mg/dL (ref 0–149)
VLDL Cholesterol Cal: 14 mg/dL (ref 5–40)

## 2019-03-05 LAB — HEMOGLOBIN A1C
Est. average glucose Bld gHb Est-mCnc: 105 mg/dL
Hgb A1c MFr Bld: 5.3 % (ref 4.8–5.6)

## 2019-04-04 ENCOUNTER — Telehealth: Payer: Self-pay | Admitting: Interventional Cardiology

## 2019-04-04 NOTE — Telephone Encounter (Signed)
I spoke with the patient who would like to start exercising and record her BP this week before making any medication changes.  She would like a call from [place] [facility] next week to discuss.

## 2019-04-04 NOTE — Telephone Encounter (Signed)
New message   Patient is returning call to [place] reference to her B/p. Please call,.

## 2019-04-14 ENCOUNTER — Telehealth: Payer: Self-pay | Admitting: Interventional Cardiology

## 2019-04-14 DIAGNOSIS — I428 Other cardiomyopathies: Secondary | ICD-10-CM

## 2019-04-14 NOTE — Telephone Encounter (Signed)
°*  STAT* If patient is at the pharmacy, call can be transferred to refill team.   1. Which medications need to be refilled? (please list name of each medication and dose if known)  furosemide (LASIX) 40 MG tablet  2. Which pharmacy/location (including street and city if local pharmacy) is medication to be sent to? [place], [place] - [address] AT [place]  3. Do they need a 30 day or 90 day supply? 90 day  Patient is out of meds.

## 2019-04-15 MED ORDER — FUROSEMIDE 40 MG PO TABS: 40.0000 mg | ORAL_TABLET | Freq: Every day | ORAL | 3 refills | Status: DC

## 2019-04-15 NOTE — Telephone Encounter (Signed)
Pt's medication was sent to pt's pharmacy as requested. Confirmation received.  °

## 2019-05-04 NOTE — Telephone Encounter (Signed)
Spoke with pt and she states she went to GYN office in the last week or two and BP was 100/89.  Pt does not have a BP monitor at home.  Advised pt to try to monitor BP 1-2x/ week if possible and let us know if consistently higher than 130/80.  Pt verbalized understanding.

## 2019-05-31 ENCOUNTER — Encounter (INDEPENDENT_AMBULATORY_CARE_PROVIDER_SITE_OTHER): Payer: Self-pay | Admitting: Family Medicine

## 2019-05-31 ENCOUNTER — Other Ambulatory Visit: Payer: Self-pay

## 2019-05-31 ENCOUNTER — Ambulatory Visit (INDEPENDENT_AMBULATORY_CARE_PROVIDER_SITE_OTHER): Admitting: Family Medicine

## 2019-05-31 VITALS — BP 135/88 | HR 61 | Temp 98.2°F | Ht 68.0 in | Wt 274.0 lb

## 2019-05-31 DIAGNOSIS — I5043 Acute on chronic combined systolic (congestive) and diastolic (congestive) heart failure: Secondary | ICD-10-CM

## 2019-05-31 DIAGNOSIS — I10 Essential (primary) hypertension: Secondary | ICD-10-CM

## 2019-05-31 DIAGNOSIS — R0602 Shortness of breath: Secondary | ICD-10-CM | POA: Diagnosis not present

## 2019-05-31 DIAGNOSIS — Z0289 Encounter for other administrative examinations: Secondary | ICD-10-CM

## 2019-05-31 DIAGNOSIS — Z6841 Body Mass Index (BMI) 40.0 and over, adult: Secondary | ICD-10-CM

## 2019-05-31 DIAGNOSIS — Z1331 Encounter for screening for depression: Secondary | ICD-10-CM | POA: Diagnosis not present

## 2019-05-31 DIAGNOSIS — R5383 Other fatigue: Secondary | ICD-10-CM | POA: Diagnosis not present

## 2019-06-01 LAB — CBC WITH DIFFERENTIAL/PLATELET
Basophils Absolute: 0 10*3/uL (ref 0.0–0.2)
Basos: 1 %
EOS (ABSOLUTE): 0.2 10*3/uL (ref 0.0–0.4)
Eos: 3 %
Hematocrit: 43.6 % (ref 34.0–46.6)
Hemoglobin: 15.1 g/dL (ref 11.1–15.9)
Immature Grans (Abs): 0 10*3/uL (ref 0.0–0.1)
Immature Granulocytes: 0 %
Lymphocytes Absolute: 1.9 10*3/uL (ref 0.7–3.1)
Lymphs: 36 %
MCH: 30.7 pg (ref 26.6–33.0)
MCHC: 34.6 g/dL (ref 31.5–35.7)
MCV: 89 fL (ref 79–97)
Monocytes Absolute: 0.3 10*3/uL (ref 0.1–0.9)
Monocytes: 5 %
Neutrophils Absolute: 2.9 10*3/uL (ref 1.4–7.0)
Neutrophils: 55 %
Platelets: 293 10*3/uL (ref 150–450)
RBC: 4.92 x10E6/uL (ref 3.77–5.28)
RDW: 12.7 % (ref 11.7–15.4)
WBC: 5.2 10*3/uL (ref 3.4–10.8)

## 2019-06-01 LAB — COMPREHENSIVE METABOLIC PANEL
ALT: 15 IU/L (ref 0–32)
AST: 19 IU/L (ref 0–40)
Albumin/Globulin Ratio: 1.3 (ref 1.2–2.2)
Albumin: 4 g/dL (ref 3.8–4.8)
Alkaline Phosphatase: 106 IU/L (ref 39–117)
BUN/Creatinine Ratio: 12 (ref 9–23)
BUN: 10 mg/dL (ref 6–24)
Bilirubin Total: 0.5 mg/dL (ref 0.0–1.2)
CO2: 27 mmol/L (ref 20–29)
Calcium: 9.7 mg/dL (ref 8.7–10.2)
Chloride: 99 mmol/L (ref 96–106)
Creatinine, Ser: 0.83 mg/dL (ref 0.57–1.00)
GFR calc Af Amer: 97 mL/min/{1.73_m2} (ref >59)
GFR calc non Af Amer: 84 mL/min/{1.73_m2} (ref >59)
Globulin, Total: 3.1 g/dL (ref 1.5–4.5)
Glucose: 86 mg/dL (ref 65–99)
Potassium: 4.3 mmol/L (ref 3.5–5.2)
Sodium: 137 mmol/L (ref 134–144)
Total Protein: 7.1 g/dL (ref 6.0–8.5)

## 2019-06-01 LAB — TSH: TSH: 1.12 u[IU]/mL (ref 0.450–4.500)

## 2019-06-01 LAB — INSULIN, RANDOM: INSULIN: 12 u[IU]/mL (ref 2.6–24.9)

## 2019-06-01 LAB — LIPID PANEL WITH LDL/HDL RATIO
Cholesterol, Total: 175 mg/dL (ref 100–199)
HDL: 51 mg/dL (ref >39)
LDL Chol Calc (NIH): 112 mg/dL — ABNORMAL HIGH (ref 0–99)
LDL/HDL Ratio: 2.2 ratio (ref 0.0–3.2)
Triglycerides: 65 mg/dL (ref 0–149)
VLDL Cholesterol Cal: 12 mg/dL (ref 5–40)

## 2019-06-01 LAB — T3: T3, Total: 117 ng/dL (ref 71–180)

## 2019-06-01 LAB — VITAMIN B12: Vitamin B-12: 326 pg/mL (ref 232–1245)

## 2019-06-01 LAB — HEMOGLOBIN A1C
Est. average glucose Bld gHb Est-mCnc: 108 mg/dL
Hgb A1c MFr Bld: 5.4 % (ref 4.8–5.6)

## 2019-06-01 LAB — VITAMIN D 25 HYDROXY (VIT D DEFICIENCY, FRACTURES): Vit D, 25-Hydroxy: 13.7 ng/mL — ABNORMAL LOW (ref 30.0–100.0)

## 2019-06-01 LAB — T4, FREE: Free T4: 1.35 ng/dL (ref 0.82–1.77)

## 2019-06-01 LAB — FOLATE: Folate: 7.4 ng/mL (ref >3.0)

## 2019-06-01 NOTE — Progress Notes (Signed)
Office: [phone]  /  Fax: [phone]   Dear Dr. Garwin Long,   Thank you for referring Suzanne Long to our clinic. The following note includes my evaluation and treatment recommendations.  HPI:   Chief Complaint: OBESITY    Suzanne Long has been referred by Suzanne Salina, MD for consultation regarding her obesity and obesity related comorbidities.    Suzanne Long (MR# [id number]) is a 48 y.o. female who presents on 05/31/2019 for obesity evaluation and treatment. Current BMI is Body mass index is 41.66 kg/m. Suzanne Long has been struggling with her weight for many years and has been unsuccessful in either losing weight, maintaining weight loss, or reaching her healthy weight goal.     Suzanne Long states she is currently in the action stage of change and ready to dedicate time achieving and maintaining a healthier weight. Suzanne Long is interested in becoming our patient and working on intensive lifestyle modifications including (but not limited to) diet, exercise and weight loss.    Suzanne Long states her family eats meals together she thinks her family will eat healthier with  her she struggles with family and or coworkers weight loss sabotage her desired weight loss is 99-104 lbs she started gaining weight in her mid 30"s her heaviest weight ever was 274 lbs she is a picky eater and doesn't like to eat healthier foods  she has significant food cravings issues  she snacks frequently in the evenings she skips meals frequently she is trying to eat vegetarian she is trying to eat vegan she is frequently drinking liquids with calories she struggles with emotional eating    Fatigue Suzanne Long feels her energy is lower than it should be. This has worsened with weight gain and has not worsened recently. Suzanne Long admits to daytime somnolence and admits to waking up refreshed and waking up still tired. Patient is at risk for obstructive sleep apnea. Patent has a history of symptoms of morning  headaches. Patient generally gets 8 hours of sleep per night, and states they generally have generally restful sleep. Snoring is not present. Apneic episodes are not present. Epworth Sleepiness Score is 9.  Dyspnea on exertion Suzanne Long notes increasing shortness of breath with exercising and seems to be worsening over time with weight gain. She notes getting out of breath sooner with activity than she used to. This has not gotten worse recently. Suzanne Long denies orthopnea.  Combined Systolic and Diastolic CHF Suzanne Long is stable on medications and denies shortness of breath at rest or recent exacerbations. She is followed by Cardiology.  Hypertension Suzanne Long is a 48 y.o. female with hypertension. Suzanne Long blood pressure is stable on medications. She denies chest pain or dizziness. She is working on weight loss to help control her blood pressure with the goal of decreasing her risk of heart attack and stroke.   Depression Screen Suzanne Long's Food and Mood (modified PHQ-9) score was  Depression screen PHQ 2/9 05/31/2019  Decreased Interest 2  Down, Depressed, Hopeless 2  PHQ - 2 Score 4  Altered sleeping 0  Tired, decreased energy 1  Change in appetite 1  Feeling bad or failure about yourself  1  Trouble concentrating 1  Moving slowly or fidgety/restless 1  Suicidal thoughts 0  PHQ-9 Score 9  Difficult doing work/chores Not difficult at all    ASSESSMENT AND PLAN:  Other fatigue - Plan: EKG 12-Lead, Comprehensive metabolic panel, CBC with Differential/Platelet, Hemoglobin A1c, Insulin, random, Vitamin D (25 hydroxy), B12, Folate, T3, T4, free, TSH  SOB (shortness of breath)  on exertion - Plan: Lipid Panel With LDL/HDL Ratio  Acute on chronic combined systolic and diastolic congestive heart failure (HCC)  Essential hypertension  Depression screening  Class 3 severe obesity with serious comorbidity and body mass index (BMI) of 40.0 to 44.9 in adult, unspecified obesity type ([place])   PLAN:  Fatigue Suzanne Long was informed that her fatigue may be related to obesity, depression or many other causes. Labs will be ordered, and in the meanwhile Suzanne Long has agreed to work on diet, exercise and weight loss to help with fatigue. Proper sleep hygiene was discussed including the need for 7-8 hours of quality sleep each night. A sleep study was not ordered based on symptoms and Epworth score.  Dyspnea on exertion Suzanne Long's shortness of breath appears to be obesity related and exercise induced. She has agreed to work on weight loss and gradually increase exercise to treat her exercise induced shortness of breath. If Starlina follows our instructions and loses weight without improvement of her shortness of breath, we will plan to refer to pulmonology. We will monitor this condition regularly. Suzanne Long agrees to this plan.  Combined Systolic and Diastolic CHF Suzanne Long was advised to work on diet and exercise, and will continue to follow closely. Suzanne Long agrees to follow up with our clinic in 2 weeks.  Hypertension We discussed sodium restriction, working on healthy weight loss, and a regular exercise program as the means to achieve improved blood pressure control. Suzanne Long agreed with this plan and agreed to follow up as directed. We will continue to monitor her blood pressure as well as her progress with the above lifestyle modifications. Suzanne Long will start her diet, and will continue her medications as prescribed. She will watch for signs of hypotension as she continues her lifestyle modifications. Suzanne Long agrees to follow up with our clinic in 2 weeks.  Depression Screen Suzanne Long had a mildly positive depression screening. Depression is commonly associated with obesity and often results in emotional eating behaviors. We will monitor this closely and work on CBT to help improve the non-hunger eating patterns. Referral to Psychology may be required if no improvement is seen as she continues in our  clinic.  Obesity Suzanne Long is currently in the action stage of change and her goal is to continue with weight loss efforts. I recommend Suzanne Long begin the structured treatment plan as follows:  She has agreed to follow the Category 3 plan  Suzanne Long has been instructed to eventually work up to a goal of 150 minutes of combined cardio and strengthening exercise per week for weight loss and overall health benefits. We discussed the following Behavioral Modification Strategies today: increasing lean protein intake, decreasing simple carbohydrates , work on meal planning and easy cooking plans and holiday eating strategies    She was informed of the importance of frequent follow up visits to maximize her success with intensive lifestyle modifications for her multiple health conditions. She was informed we would discuss her lab results at her next visit unless there is a critical issue that needs to be addressed sooner. Kaprice agreed to keep her next visit at the agreed upon time to discuss these results.  ALLERGIES: Allergies  Allergen Reactions  . Shellfish Allergy Anaphylaxis    Uncoded Allergy. Allergen: SHELLFISH  . Gadolinium Derivatives Nausea And Vomiting    Vomiting after 20cc multihance injection  . Iodine Itching  . Latex Rash    MEDICATIONS: Current Outpatient Medications on File Prior to Visit  Medication Sig Dispense Refill  . carvedilol (COREG)  25 MG tablet Take 1 tablet (25 mg total) by mouth 2 (two) times daily with a meal. 180 tablet 0  . furosemide (LASIX) 40 MG tablet Take 1 tablet (40 mg total) by mouth daily. 90 tablet 3  . sacubitril-valsartan (ENTRESTO) 49-51 MG Take 1 tablet by mouth 2 (two) times daily. 60 tablet 5   No current facility-administered medications on file prior to visit.     PAST MEDICAL HISTORY: Past Medical History:  Diagnosis Date  . CHF (congestive heart failure) ([place])   . Dyspnea   . Hypertension   . Hypothyroidism   . Joint pain   .  Multiple food allergies   . NICM (nonischemic cardiomyopathy) ([place])    a. Echo 2/17:  moderate LVH, EF 30-35%, diffuse HK, moderate LAE    PAST SURGICAL HISTORY: Past Surgical History:  Procedure Laterality Date  . MYOMECTOMY    . None      SOCIAL HISTORY: Social History   Tobacco Use  . Smoking status: Never Smoker  . Smokeless tobacco: Never Used  Substance Use Topics  . Alcohol use: Yes    Alcohol/week: 0.0 standard drinks    Comment: Occasional  . Drug use: No    FAMILY HISTORY: Family History  Problem Relation Age of Onset  . Diabetes Mother   . Hypertension Mother   . Depression Mother   . Obesity Mother   . Muscular dystrophy Brother   . Kidney failure Brother   . Muscular dystrophy Son   . Migraines Neg Hx   . Multiple sclerosis Neg Hx   . Thyroid disease Neg Hx     ROS: Review of Systems  Constitutional: Positive for malaise/fatigue. Negative for weight loss.  Eyes:       + Wear glasses or contacts + Floaters  Respiratory: Positive for shortness of breath (with exertion).   Cardiovascular: Negative for chest pain and orthopnea.       + very cold feet or hands  Musculoskeletal:       + Muscle or joint pain + Red or swollen joints  Neurological: Positive for weakness. Negative for dizziness.  Endo/Heme/Allergies: Bruises/bleeds easily.    PHYSICAL EXAM: Blood pressure 135/88, pulse 61, temperature 98.2 F (36.8 C), temperature source Oral, height $RemoveBefo'5\' 8"'nvVXjDVxHAJ$  (1.727 m), weight 274 lb (124.3 kg), last menstrual period 05/03/2019, SpO2 97 %. Body mass index is 41.66 kg/m. Physical Exam Vitals signs reviewed.  Constitutional:      Appearance: Normal appearance. She is obese.  HENT:     Head: Normocephalic and atraumatic.     Nose: Nose normal.  Eyes:     General: No scleral icterus.    Extraocular Movements: Extraocular movements intact.  Neck:     Musculoskeletal: Normal range of motion and neck supple.     Comments: No thyromegaly present  Cardiovascular:     Rate and Rhythm: Normal rate and regular rhythm.     Pulses: Normal pulses.     Heart sounds: Normal heart sounds.  Pulmonary:     Effort: Pulmonary effort is normal. No respiratory distress.     Breath sounds: Normal breath sounds.  Abdominal:     Palpations: Abdomen is soft.     Tenderness: There is no abdominal tenderness.     Comments: + Obesity  Musculoskeletal: Normal range of motion.     Right lower leg: No edema.     Left lower leg: No edema.  Skin:    General: Skin is warm and dry.  Neurological:     Mental Status: She is alert and oriented to person, place, and time.     Coordination: Coordination normal.  Psychiatric:        Mood and Affect: Mood normal.        Behavior: Behavior normal.     RECENT LABS AND TESTS: BMET    Component Value Date/Time   NA 137 05/31/2019 1140   K 4.3 05/31/2019 1140   CL 99 05/31/2019 1140   CO2 27 05/31/2019 1140   GLUCOSE 86 05/31/2019 1140   GLUCOSE 99 04/09/2016 1014   BUN 10 05/31/2019 1140   CREATININE 0.83 05/31/2019 1140   CREATININE 0.81 11/19/2015 0902   CALCIUM 9.7 05/31/2019 1140   GFRNONAA 84 05/31/2019 1140   GFRAA 97 05/31/2019 1140   Lab Results  Component Value Date   HGBA1C 5.4 05/31/2019   Lab Results  Component Value Date   INSULIN 12.0 05/31/2019   CBC    Component Value Date/Time   WBC 5.2 05/31/2019 1140   WBC 6.6 05/30/2015 0220   RBC 4.92 05/31/2019 1140   RBC 4.55 05/30/2015 0220   HGB 15.1 05/31/2019 1140   HGB 12.6 11/26/2007 1404   HCT 43.6 05/31/2019 1140   HCT 37.0 11/26/2007 1404   PLT 293 05/31/2019 1140   MCV 89 05/31/2019 1140   MCV 82.4 11/26/2007 1404   MCH 30.7 05/31/2019 1140   MCH 29.7 05/30/2015 0220   MCHC 34.6 05/31/2019 1140   MCHC 33.2 05/30/2015 0220   RDW 12.7 05/31/2019 1140   RDW 13.7 11/26/2007 1404   LYMPHSABS 1.9 05/31/2019 1140   LYMPHSABS 1.6 11/26/2007 1404   MONOABS 0.4 05/29/2015 0131   MONOABS 0.3 11/26/2007 1404   EOSABS 0.2  05/31/2019 1140   BASOSABS 0.0 05/31/2019 1140   BASOSABS 0.0 11/26/2007 1404   Iron/TIBC/Ferritin/ %Sat    Component Value Date/Time   IRON 30 (L) 11/26/2007 1404   TIBC 277 11/26/2007 1404   FERRITIN 45 11/26/2007 1404   IRONPCTSAT 11 (L) 11/26/2007 1404   Lipid Panel     Component Value Date/Time   CHOL 175 05/31/2019 1140   TRIG 65 05/31/2019 1140   HDL 51 05/31/2019 1140   CHOLHDL 3.1 03/04/2019 0826   CHOLHDL 3.4 06/02/2010 0515   VLDL 6 06/02/2010 0515   LDLCALC 112 (H) 05/31/2019 1140   Hepatic Function Panel     Component Value Date/Time   PROT 7.1 05/31/2019 1140   ALBUMIN 4.0 05/31/2019 1140   AST 19 05/31/2019 1140   ALT 15 05/31/2019 1140   ALKPHOS 106 05/31/2019 1140   BILITOT 0.5 05/31/2019 1140   BILIDIR 0.11 03/04/2019 0826      Component Value Date/Time   TSH 1.120 05/31/2019 1140   TSH 1.429 10/11/2015 1504   TSH 3.346 05/29/2015 0515   TSH 0.822 07/27/2014 1059   TSH 2.500 06/01/2010 1100    ECG  shows NSR with a rate of 61 BPM INDIRECT CALORIMETER done today shows a VO2 of 233 and a REE of 1625.  Her calculated basal metabolic rate is 1245 thus her basal metabolic rate is worse than expected.       OBESITY BEHAVIORAL INTERVENTION VISIT  Today's visit was # 1   Starting weight: 274 lbs Starting date: 05/31/2019 Today's weight : 274 lbs Today's date: 05/31/2019 Total lbs lost to date: 0 At least 15 minutes were spent on discussing the following behavioral intervention visit.   ASK: We discussed the diagnosis  of obesity with Etheleen Nicks today and Yesha agreed to give [place] permission to discuss obesity behavioral modification therapy today.  ASSESS: Briele has the diagnosis of obesity and her BMI today is 32.76 Naziah is in the action stage of change   ADVISE: Iyona was educated on the multiple health risks of obesity as well as the benefit of weight loss to improve her health. She was advised of the need for long term  treatment and the importance of lifestyle modifications to improve her current health and to decrease her risk of future health problems.  AGREE: Multiple dietary modification options and treatment options were discussed and  Gwenyth agreed to follow the recommendations documented in the above note.  ARRANGE: Suzanne Long was educated on the importance of frequent visits to treat obesity as outlined per CMS and USPSTF guidelines and agreed to schedule her next follow up appointment today.  I, Suzanne Long, am acting as transcriptionist for Dennard Nip, MD  I have reviewed the above documentation for accuracy and completeness, and I agree with the above. -Dennard Nip, MD

## 2019-06-14 ENCOUNTER — Ambulatory Visit (INDEPENDENT_AMBULATORY_CARE_PROVIDER_SITE_OTHER): Admitting: Family Medicine

## 2019-06-15 ENCOUNTER — Ambulatory Visit (HOSPITAL_COMMUNITY)
Admission: EM | Admit: 2019-06-15 | Discharge: 2019-06-15 | Disposition: A | Discharge status: Home / Self Care | Attending: Family Medicine | Admitting: Family Medicine

## 2019-06-15 ENCOUNTER — Encounter (HOSPITAL_COMMUNITY): Payer: Self-pay

## 2019-06-15 ENCOUNTER — Other Ambulatory Visit: Payer: Self-pay

## 2019-06-15 DIAGNOSIS — Z20828 Contact with and (suspected) exposure to other viral communicable diseases: Secondary | ICD-10-CM

## 2019-06-15 DIAGNOSIS — Z20822 Contact with and (suspected) exposure to covid-19: Secondary | ICD-10-CM

## 2019-06-15 NOTE — ED Provider Notes (Signed)
[facility]   [id number] 06/15/19 Arrival Time: 1516  ASSESSMENT & PLAN:  1. Exposure to COVID-19 virus     COVID-19 testing sent. To self-quarantine until results are available.  Follow-up Information    [facility]   Specialty: Urgent Care Why: As needed. Contact information: [address] [place] [address] [phone]          Reviewed expectations re: course of current medical issues. Questions answered. Outlined signs and symptoms indicating need for more acute intervention. Patient verbalized understanding. After Visit Summary given.   SUBJECTIVE: History from: patient. Suzanne Long is a 48 y.o. female who requests COVID-19 testing. Known COVID-19 contact: does report recent exposure to a person who tested positive. Recent travel: none. Denies: runny nose, congestion, fever, cough, sore throat, difficulty breathing and headache.  ROS: As per HPI.   OBJECTIVE:  Vitals:   06/15/19 1618 06/15/19 1621  BP:  (!) 144/105  Pulse:  64  Resp:  18  Temp:  98.2 F (36.8 C)  TempSrc:  Oral  SpO2:  100%  Weight: 124.7 kg     General appearance: alert; no distress Eyes: PERRLA; EOMI; conjunctiva normal HENT: [place]; AT Neck: supple  Lungs: speaks full sentences without difficulty; no coughing; unlabored Heart: regular  Abdomen: soft, non-tender Extremities: no edema Skin: warm and dry Neurologic: normal gait Psychological: alert and cooperative; normal mood and affect  Labs:  Labs Reviewed  NOVEL CORONAVIRUS, NAA (HOSP ORDER, SEND-OUT TO REF LAB; TAT 18-24 HRS)     Allergies  Allergen Reactions  . Shellfish Allergy Anaphylaxis    Uncoded Allergy. Allergen: SHELLFISH  . Gadolinium Derivatives Nausea And Vomiting    Vomiting after 20cc multihance injection  . Iodine Itching  . Latex Rash    Past Medical History:  Diagnosis Date  . CHF (congestive heart failure) (HCC)   . Dyspnea    . Hypertension   . Hypothyroidism   . Joint pain   . Multiple food allergies   . NICM (nonischemic cardiomyopathy) ([facility])    a. Echo 2/17:  moderate LVH, EF 30-35%, diffuse HK, moderate LAE   Social History   Socioeconomic History  . Marital status: Married    Spouse name: Dannielle Huh  . Number of children: 2  . Years of education: 36  . Highest education level: Not on file  Occupational History  . Occupation: unemployed  Social Needs  . Financial resource strain: Not on file  . Food insecurity    Worry: Not on file    Inability: Not on file  . Transportation needs    Medical: Not on file    Non-medical: Not on file  Tobacco Use  . Smoking status: Never Smoker  . Smokeless tobacco: Never Used  Substance and Sexual Activity  . Alcohol use: Yes    Alcohol/week: 0.0 standard drinks    Comment: Occasional  . Drug use: No  . Sexual activity: Not on file  Lifestyle  . Physical activity    Days per week: Not on file    Minutes per session: Not on file  . Stress: Not on file  Relationships  . Social Musician on phone: Not on file    Gets together: Not on file    Attends religious service: Not on file    Active member of club or organization: Not on file    Attends meetings of clubs or organizations: Not on file  Relationship status: Not on file  . Intimate partner violence    Fear of current or ex partner: Not on file    Emotionally abused: Not on file    Physically abused: Not on file    Forced sexual activity: Not on file  Other Topics Concern  . Not on file  Social History Narrative   Patient is married husband name [name]    Patient has 2 children. Both have past away.    Patient is right handed.    Patient is unemployed    Patient has a high school education    Family History  Problem Relation Age of Onset  . Diabetes Mother   . Hypertension Mother   . Depression Mother   . Obesity Mother   . Muscular dystrophy Brother   . Kidney failure Brother    . Muscular dystrophy Son   . Migraines Neg Hx   . Multiple sclerosis Neg Hx   . Thyroid disease Neg Hx    Past Surgical History:  Procedure Laterality Date  . MYOMECTOMY    . None       Vanessa Kick, MD 06/15/19 [phone]

## 2019-06-15 NOTE — ED Triage Notes (Signed)
Pt states she was around someone that tested positive for Covid. Pt wants to be tested.

## 2019-06-15 NOTE — Discharge Instructions (Signed)
You have been tested for COVID today. If your test returns positive, you will receive a phone call from [facility] regarding your results. Negative test results are not called. Both positive and negative results area always visible on MyChart. If you do not have a MyChart account, sign up instructions are in your discharge papers.

## 2019-06-15 NOTE — ED Notes (Signed)
Nasal swab in lab

## 2019-06-16 ENCOUNTER — Ambulatory Visit (INDEPENDENT_AMBULATORY_CARE_PROVIDER_SITE_OTHER): Admitting: Family Medicine

## 2019-06-17 LAB — NOVEL CORONAVIRUS, NAA (HOSP ORDER, SEND-OUT TO REF LAB; TAT 18-24 HRS): SARS-CoV-2, NAA: NOT DETECTED

## 2019-06-20 ENCOUNTER — Other Ambulatory Visit: Payer: Self-pay | Admitting: Interventional Cardiology

## 2019-06-20 DIAGNOSIS — I428 Other cardiomyopathies: Secondary | ICD-10-CM

## 2019-06-20 MED ORDER — CARVEDILOL 25 MG PO TABS: 25.0000 mg | ORAL_TABLET | Freq: Two times a day (BID) | ORAL | 2 refills | Status: DC

## 2019-06-21 ENCOUNTER — Ambulatory Visit (INDEPENDENT_AMBULATORY_CARE_PROVIDER_SITE_OTHER): Admitting: Family Medicine

## 2019-06-21 ENCOUNTER — Other Ambulatory Visit: Payer: Self-pay

## 2019-06-21 ENCOUNTER — Encounter (INDEPENDENT_AMBULATORY_CARE_PROVIDER_SITE_OTHER): Payer: Self-pay | Admitting: Family Medicine

## 2019-06-21 VITALS — BP 134/90 | HR 62 | Temp 98.1°F | Ht 68.0 in | Wt 270.0 lb

## 2019-06-21 DIAGNOSIS — Z6841 Body Mass Index (BMI) 40.0 and over, adult: Secondary | ICD-10-CM

## 2019-06-21 DIAGNOSIS — Z9189 Other specified personal risk factors, not elsewhere classified: Secondary | ICD-10-CM | POA: Diagnosis not present

## 2019-06-21 DIAGNOSIS — E559 Vitamin D deficiency, unspecified: Secondary | ICD-10-CM | POA: Diagnosis not present

## 2019-06-21 DIAGNOSIS — E538 Deficiency of other specified B group vitamins: Secondary | ICD-10-CM

## 2019-06-21 DIAGNOSIS — E038 Other specified hypothyroidism: Secondary | ICD-10-CM

## 2019-06-21 DIAGNOSIS — E7849 Other hyperlipidemia: Secondary | ICD-10-CM

## 2019-06-21 DIAGNOSIS — E8881 Metabolic syndrome: Secondary | ICD-10-CM | POA: Diagnosis not present

## 2019-06-21 DIAGNOSIS — I5043 Acute on chronic combined systolic (congestive) and diastolic (congestive) heart failure: Secondary | ICD-10-CM

## 2019-06-22 NOTE — Progress Notes (Signed)
Office: [phone]  /  Fax: [phone]   HPI:   Chief Complaint: OBESITY Suzanne Long is here to discuss her progress with her obesity treatment plan. She is on the Category 3 plan and is following her eating plan approximately 75 % of the time. She states she is walking for 50 minutes 2 times per week.  Suzanne Long has a poor schedule and she is not working. She is currently eating dinner between 7-10 pm, and she is not hungry in the morning so she may not eat until lunch. She is overall happy with the diet plan.   Her weight is 270 lb (122.5 kg) today and has had a weight loss of 4 pounds over a period of 2 weeks since her last visit. She has lost 4 lbs since starting treatment with [place].  Hyperlipidemia Suzanne Long has a diagnosis of hyperlipidemia and has been trying to improve her cholesterol levels with intensive lifestyle modification including a low saturated fat diet, exercise and weight loss. She is not on statin and denies any chest pain, claudication or myalgias.  Insulin Resistance Suzanne Long has a diagnosis of insulin resistance based on her elevated fasting insulin level >5. Although Suzanne Long's blood glucose readings are still under good control, insulin resistance puts her at greater risk of metabolic syndrome and diabetes. She continues to work on diet and exercise to decrease risk of diabetes.  At risk for diabetes Suzanne Long is at higher than average risk for developing diabetes due to her obesity and insulin resistance.   Vitamin D Deficiency Suzanne Long has a diagnosis of vitamin D deficiency. She is not currently taking Vit D.  Vitamin I94 Deficiency Suzanne Long has a diagnosis of B12 insufficiency and notes fatigue. She is not a vegetarian and does not have a previous diagnosis of pernicious anemia. She does not have a history of weight loss surgery.   Congestive Heart Failure Suzanne Long has a diagnosis of congestive heart failure (combined). She is taking Entresto, Lasix, and Coreg. She  denies chest pain, shortness of breath, or edema.  Hypothyroidism Suzanne Long has a diagnosis of hypothyroidism. She is not on levothyroxine. She has a history of normal labs, so likely diagnosed with subclinical hypothyroidism previously. She denies hot or cold intolerance or palpitations.  ASSESSMENT AND PLAN:  Other hyperlipidemia  Insulin resistance  Vitamin D deficiency  B12 nutritional deficiency  Acute on chronic combined systolic and diastolic congestive heart failure (HCC)  Other specified hypothyroidism  At risk for diabetes mellitus  Class 3 severe obesity with serious comorbidity and body mass index (BMI) of 40.0 to 44.9 in adult, unspecified obesity type ([place])  PLAN:  Hyperlipidemia Intensive lifestyle modifications as the first line treatment for hyperlipidemia. We discussed many lifestyle modifications today and Suzanne Long will continue to work on diet, exercise and weight loss efforts. We will continue to monitor.  Insulin Resistance Suzanne Long will continue to work on weight loss, exercise, and decreasing simple carbohydrates to help decrease the risk of diabetes. Suzanne Long agrees to follow up with [place] as directed to closely monitor her progress.  Diabetes risk counseling (~85 min) Suzanne Long is a 48 y.o. female and has risk factors for diabetes including obesity and insulin resistance. We discussed intensive lifestyle modifications today with an emphasis on weight loss as well as increasing exercise and decreasing simple carbohydrates in her diet.  Vitamin D Deficiency Low vitamin D level contributes to fatigue and are associated with obesity, breast, and colon cancer. Suzanne Long agrees to start prescription Vit D 50,000 IU every week #  4 with no refills. She will follow up for routine testing of vitamin D, at least 2-3 times per year to avoid over-replacement. Suzanne Long agrees to follow up with our clinic in 2 weeks.  Vitamin Y61 Deficiency Suzanne Long will work on increasing B12 rich  foods in her diet. B12 supplementation was not prescribed today. We will continue to monitor.  Congestive Heart Failure Suzanne Long will continue her medications and we will continue to monitor.  Hypothyroidism Suzanne Long was informed of the importance of good thyroid control for overall health and that supertheraputic thyroid levels are dangerous and will not improve weight loss results. We will continue to monitor.  Obesity Suzanne Long is currently in the action stage of change. As such, her goal is to continue with weight loss efforts. She has agreed to follow the Category 3 plan.  Suzanne Long has been instructed to work up to a goal of 150 minutes of combined cardio and strengthening exercise per week for weight loss and overall health benefits.  We discussed the following Behavioral Modification Strategies today: increasing lean protein intake, decreasing simple carbohydrates, increasing vegetables, increase H20 initake, keeping healthy foods in the home, and work on meal planning and easy cooking plans Suzanne Long will work on having more of a regular schedule.  Suzanne Long has agreed to follow up with our clinic in 2 weeks. She was informed of the importance of frequent follow up visits to maximize her success with intensive lifestyle modifications for her multiple health conditions.  ALLERGIES: Allergies  Allergen Reactions  . Shellfish Allergy Anaphylaxis    Uncoded Allergy. Allergen: SHELLFISH  . Gadolinium Derivatives Nausea And Vomiting    Vomiting after 20cc multihance injection  . Iodine Itching  . Latex Rash    MEDICATIONS: Current Outpatient Medications on File Prior to Visit  Medication Sig Dispense Refill  . carvedilol (COREG) 25 MG tablet Take 1 tablet (25 mg total) by mouth 2 (two) times daily with a meal. 180 tablet 2  . furosemide (LASIX) 40 MG tablet Take 1 tablet (40 mg total) by mouth daily. 90 tablet 3  . sacubitril-valsartan (ENTRESTO) 49-51 MG Take 1 tablet by mouth 2 (two)  times daily. 60 tablet 5   No current facility-administered medications on file prior to visit.    PAST MEDICAL HISTORY: Past Medical History:  Diagnosis Date  . CHF (congestive heart failure) ([place])   . Dyspnea   . Hypertension   . Hypothyroidism   . Joint pain   . Multiple food allergies   . NICM (nonischemic cardiomyopathy) ([place])    a. Echo 2/17:  moderate LVH, EF 30-35%, diffuse HK, moderate LAE    PAST SURGICAL HISTORY: Past Surgical History:  Procedure Laterality Date  . MYOMECTOMY    . None     SOCIAL HISTORY: Social History   Tobacco Use  . Smoking status: Never Smoker  . Smokeless tobacco: Never Used  Substance Use Topics  . Alcohol use: Yes    Alcohol/week: 0.0 standard drinks    Comment: Occasional  . Drug use: No   FAMILY HISTORY: Family History  Problem Relation Age of Onset  . Diabetes Mother   . Hypertension Mother   . Depression Mother   . Obesity Mother   . Muscular dystrophy Brother   . Kidney failure Brother   . Muscular dystrophy Son   . Migraines Neg Hx   . Multiple sclerosis Neg Hx   . Thyroid disease Neg Hx    ROS: Review of Systems  Constitutional: Positive for  weight loss.  Cardiovascular: Negative for chest pain, palpitations and claudication.  Gastrointestinal: Negative for nausea and vomiting.  Genitourinary: Negative for frequency.  Musculoskeletal: Negative for myalgias.       Negative muscle weakness  Endo/Heme/Allergies: Negative for polydipsia.       Negative hot/cold intolerance   PHYSICAL EXAM: Blood pressure 134/90, pulse 62, temperature 98.1 F (36.7 C), temperature source Oral, height $RemoveBefo'5\' 8"'iqeSyxWjkhx$  (1.727 m), weight 270 lb (122.5 kg), SpO2 96 %. Body mass index is 41.05 kg/m.   Physical Exam Vitals signs reviewed.  Constitutional:      Appearance: Normal appearance. She is obese.  Cardiovascular:     Rate and Rhythm: Normal rate.     Pulses: Normal pulses.  Pulmonary:     Effort: Pulmonary effort is normal.      Breath sounds: Normal breath sounds.  Musculoskeletal: Normal range of motion.  Skin:    General: Skin is warm and dry.  Neurological:     Mental Status: She is alert and oriented to person, place, and time.  Psychiatric:        Mood and Affect: Mood normal.        Behavior: Behavior normal.    RECENT LABS AND TESTS: BMET    Component Value Date/Time   NA 137 05/31/2019 1140   K 4.3 05/31/2019 1140   CL 99 05/31/2019 1140   CO2 27 05/31/2019 1140   GLUCOSE 86 05/31/2019 1140   GLUCOSE 99 04/09/2016 1014   BUN 10 05/31/2019 1140   CREATININE 0.83 05/31/2019 1140   CREATININE 0.81 11/19/2015 0902   CALCIUM 9.7 05/31/2019 1140   GFRNONAA 84 05/31/2019 1140   GFRAA 97 05/31/2019 1140   Lab Results  Component Value Date   HGBA1C 5.4 05/31/2019   HGBA1C 5.3 03/04/2019   HGBA1C 5.4 07/27/2014   HGBA1C  06/01/2010    5.2 (NOTE)                                                                       According to the ADA Clinical Practice Recommendations for 2011, when HbA1c is used as a screening test:   >=6.5%   Diagnostic of Diabetes Mellitus           (if abnormal result  is confirmed)  5.7-6.4%   Increased risk of developing Diabetes Mellitus  References:Diagnosis and Classification of Diabetes Mellitus,Diabetes KTGY,5638,93(TDSKA 1):S62-S69 and Standards of Medical Care in         Diabetes - 2011,Diabetes Care,2011,34  (Suppl 1):S11-S61.   HGBA1C  06/01/2010    5.1 (NOTE)                                                                       According to the ADA Clinical Practice Recommendations for 2011, when HbA1c is used as a screening test:   >=6.5%   Diagnostic of Diabetes Mellitus           (if abnormal result  is confirmed)  5.7-6.4%  Increased risk of developing Diabetes Mellitus  References:Diagnosis and Classification of Diabetes Mellitus,Diabetes AGTX,6468,03(OZYYQ 1):S62-S69 and Standards of Medical Care in         Diabetes - 2011,Diabetes MGNO,0370,48  (Suppl  1):S11-S61.   Lab Results  Component Value Date   INSULIN 12.0 05/31/2019   CBC    Component Value Date/Time   WBC 5.2 05/31/2019 1140   WBC 6.6 05/30/2015 0220   RBC 4.92 05/31/2019 1140   RBC 4.55 05/30/2015 0220   HGB 15.1 05/31/2019 1140   HGB 12.6 11/26/2007 1404   HCT 43.6 05/31/2019 1140   HCT 37.0 11/26/2007 1404   PLT 293 05/31/2019 1140   MCV 89 05/31/2019 1140   MCV 82.4 11/26/2007 1404   MCH 30.7 05/31/2019 1140   MCH 29.7 05/30/2015 0220   MCHC 34.6 05/31/2019 1140   MCHC 33.2 05/30/2015 0220   RDW 12.7 05/31/2019 1140   RDW 13.7 11/26/2007 1404   LYMPHSABS 1.9 05/31/2019 1140   LYMPHSABS 1.6 11/26/2007 1404   MONOABS 0.4 05/29/2015 0131   MONOABS 0.3 11/26/2007 1404   EOSABS 0.2 05/31/2019 1140   BASOSABS 0.0 05/31/2019 1140   BASOSABS 0.0 11/26/2007 1404   Iron/TIBC/Ferritin/ %Sat    Component Value Date/Time   IRON 30 (L) 11/26/2007 1404   TIBC 277 11/26/2007 1404   FERRITIN 45 11/26/2007 1404   IRONPCTSAT 11 (L) 11/26/2007 1404   Lipid Panel     Component Value Date/Time   CHOL 175 05/31/2019 1140   TRIG 65 05/31/2019 1140   HDL 51 05/31/2019 1140   CHOLHDL 3.1 03/04/2019 0826   CHOLHDL 3.4 06/02/2010 0515   VLDL 6 06/02/2010 0515   LDLCALC 112 (H) 05/31/2019 1140   Hepatic Function Panel     Component Value Date/Time   PROT 7.1 05/31/2019 1140   ALBUMIN 4.0 05/31/2019 1140   AST 19 05/31/2019 1140   ALT 15 05/31/2019 1140   ALKPHOS 106 05/31/2019 1140   BILITOT 0.5 05/31/2019 1140   BILIDIR 0.11 03/04/2019 0826      Component Value Date/Time   TSH 1.120 05/31/2019 1140   TSH 1.429 10/11/2015 1504   TSH 3.346 05/29/2015 0515   TSH 0.822 07/27/2014 1059   TSH 2.500 06/01/2010 1100      OBESITY BEHAVIORAL INTERVENTION VISIT  Today's visit was # 2   Starting weight: 274 lbs Starting date: 05/31/2019 Today's weight : 270 lbs  Today's date: 06/21/2019 Total lbs lost to date: 4    ASK: We discussed the diagnosis of  obesity with Suzanne Long today and Suzanne Long agreed to give [place] permission to discuss obesity behavioral modification therapy today.  ASSESS: Undra has the diagnosis of obesity and her BMI today is 88.91 Suzanne Long is in the action stage of change   ADVISE: Suzanne Long was educated on the multiple health risks of obesity as well as the benefit of weight loss to improve her health. She was advised of the need for long term treatment and the importance of lifestyle modifications to improve her current health and to decrease her risk of future health problems.  AGREE: Multiple dietary modification options and treatment options were discussed and  Suzanne Long agreed to follow the recommendations documented in the above note.  ARRANGE: Suzanne Long was educated on the importance of frequent visits to treat obesity as outlined per CMS and USPSTF guidelines and agreed to schedule her next follow up appointment today.  Suzanne Long, am acting as transcriptionist for Briscoe Deutscher, DO  I have  reviewed the above documentation for accuracy and completeness, and I agree with the above. Briscoe Deutscher, DO

## 2019-06-23 ENCOUNTER — Encounter (INDEPENDENT_AMBULATORY_CARE_PROVIDER_SITE_OTHER): Payer: Self-pay | Admitting: Family Medicine

## 2019-06-23 MED ORDER — VITAMIN D (ERGOCALCIFEROL) 1.25 MG (50000 UNIT) PO CAPS: 50000.0000 [IU] | ORAL_CAPSULE | ORAL | 0 refills | Status: DC

## 2019-07-19 ENCOUNTER — Ambulatory Visit (INDEPENDENT_AMBULATORY_CARE_PROVIDER_SITE_OTHER): Admitting: Family Medicine

## 2019-07-19 ENCOUNTER — Other Ambulatory Visit: Payer: Self-pay

## 2019-07-19 ENCOUNTER — Encounter (INDEPENDENT_AMBULATORY_CARE_PROVIDER_SITE_OTHER): Payer: Self-pay | Admitting: Family Medicine

## 2019-07-19 VITALS — BP 106/71 | HR 69 | Temp 98.1°F | Ht 68.0 in | Wt 267.0 lb

## 2019-07-19 DIAGNOSIS — E8881 Metabolic syndrome: Secondary | ICD-10-CM

## 2019-07-19 DIAGNOSIS — Z9189 Other specified personal risk factors, not elsewhere classified: Secondary | ICD-10-CM

## 2019-07-19 DIAGNOSIS — E559 Vitamin D deficiency, unspecified: Secondary | ICD-10-CM | POA: Diagnosis not present

## 2019-07-19 DIAGNOSIS — E039 Hypothyroidism, unspecified: Secondary | ICD-10-CM

## 2019-07-19 DIAGNOSIS — I5043 Acute on chronic combined systolic (congestive) and diastolic (congestive) heart failure: Secondary | ICD-10-CM | POA: Diagnosis not present

## 2019-07-19 DIAGNOSIS — E7849 Other hyperlipidemia: Secondary | ICD-10-CM

## 2019-07-19 DIAGNOSIS — Z6841 Body Mass Index (BMI) 40.0 and over, adult: Secondary | ICD-10-CM

## 2019-07-19 DIAGNOSIS — E038 Other specified hypothyroidism: Secondary | ICD-10-CM

## 2019-07-19 MED ORDER — VITAMIN D (ERGOCALCIFEROL) 1.25 MG (50000 UNIT) PO CAPS: 50000.0000 [IU] | ORAL_CAPSULE | ORAL | 0 refills | Status: DC

## 2019-07-22 DIAGNOSIS — E8881 Metabolic syndrome: Secondary | ICD-10-CM | POA: Insufficient documentation

## 2019-07-22 DIAGNOSIS — E559 Vitamin D deficiency, unspecified: Secondary | ICD-10-CM | POA: Insufficient documentation

## 2019-07-22 DIAGNOSIS — E7849 Other hyperlipidemia: Secondary | ICD-10-CM | POA: Insufficient documentation

## 2019-07-22 DIAGNOSIS — E88819 Insulin resistance, unspecified: Secondary | ICD-10-CM | POA: Insufficient documentation

## 2019-07-22 NOTE — Progress Notes (Signed)
Chief Complaint: OBESITY Suzanne Long is here to discuss her progress with her obesity treatment plan along with follow-up of her obesity related diagnoses. [name] is on the Category 3 Plan and states she is following her eating plan approximately 80% of the time. [name] states she is doing pilates for 30 minutes 1 time per week.  Today's visit was #: 3 Starting weight: 274 lbs Starting date: 05/31/2019 Today's weight: 267 lbs Today's date: 07/22/2019 Total lbs lost to date: 7 lbs Total lbs lost since last in-office visit: 3 lbs  Interim History: Suzanne Long states she is doing well.  She likes the diet.  Recently started doing pilates.  Unfortunately, will not be able to come back due to finances.  Subjective:   1. Other hyperlipidemia Suzanne Long has hyperlipidemia and has been trying to improve her cholesterol levels with intensive lifestyle modification including a low saturated fat diet, exercise and weight loss. She denies any chest pain, claudication or myalgias.  Lab Results  Component Value Date   ALT 15 05/31/2019   AST 19 05/31/2019   ALKPHOS 106 05/31/2019   BILITOT 0.5 05/31/2019   Lab Results  Component Value Date   CHOL 175 05/31/2019   HDL 51 05/31/2019   LDLCALC 112 (H) 05/31/2019   TRIG 65 05/31/2019   CHOLHDL 3.1 03/04/2019   2. Insulin resistance Suzanne Long has a diagnosis of insulin resistance based on her elevated fasting insulin level >5.  She continues to work on diet and exercise to decrease her risk of diabetes.    Lab Results  Component Value Date   INSULIN 12.0 05/31/2019   3. Vitamin D deficiency She's Vitamin D level was 13.7 on 05/31/2019. She is currently taking vit D. She denies nausea, vomiting or muscle weakness.  4. Acute on chronic combined systolic and diastolic congestive heart failure [facility]) She is follow by Dr. Katrinka Blazing, Cardiology. Cardiovascular ROS: no chest pain or dyspnea on exertion.  5. Subclinical hypothyroidism Lab Results    Component Value Date   TSH 1.120 05/31/2019   FREET4 1.35 05/31/2019   Lab Results  Component Value Date   TSH 1.120 05/31/2019   TSH 1.429 10/11/2015   TSH 3.346 05/29/2015   TSH 0.822 07/27/2014   TSH 2.500 06/01/2010   TSH 3.308 06/01/2010   FREET4 1.35 05/31/2019    6. At risk for diabetes mellitus Suzanne Long is at higher than average risk for developing diabetes due to her obesity.   Assessment/Plan:   1. Other hyperlipidemia Cardiovascular risk and specific lipid/LDL goals reviewed.  We discussed several lifestyle modifications today and Suzanne Long will continue to work on diet, exercise and weight loss efforts. Orders and follow up as documented in patient record.   Counseling Intensive lifestyle modifications are the first line treatment for this issue. . Dietary changes: Increase soluble fiber. Decrease simple carbohydrates. . Exercise changes: Moderate to vigorous-intensity aerobic activity 150 minutes per week if tolerated. . Lipid-lowering medications: see documented in medical record.  2. Insulin resistance Suzanne Long will continue to work on weight loss, exercise, and decreasing simple carbohydrates to help decrease the risk of diabetes. Suzanne Long agreed to follow-up with [place] as directed to closely monitor her progress.  3. Vitamin D deficiency Low Vitamin D level contributes to fatigue and are associated with obesity, breast, and colon cancer. She agrees to continue to take prescription Vitamin D @50 ,000 IU every week and will follow-up for routine testing of vitamin D, at least 2-3 times per year to avoid over-replacement.  Orders - Vitamin D, Ergocalciferol, (DRISDOL) 1.25 MG (50000 UT) CAPS capsule; Take 1 capsule (50,000 Units total) by mouth every 7 (seven) days.  Dispense: 4 capsule; Refill: 0  4. Acute on chronic combined systolic and diastolic congestive heart failure ([place]) Current treatment plan is effective, no change in therapy. We will continue to monitor.  Counseling: Intensive lifestyle modifications are the first line treatment for this issue. We discussed several lifestyle modifications today and she will continue to work on diet, exercise and weight loss efforts.  5. Subclinical hypothyroidism We will continue to monitor.  6. At risk for diabetes mellitus [name] is a 49 y.o. female and has risk factors for diabetes including obesity. We discussed intensive lifestyle modifications today with an emphasis on weight loss as well as increasing exercise and decreasing simple carbohydrates in her diet. (15 minutes)  7. Class 3 severe obesity with serious comorbidity and body mass index (BMI) of 40.0 to 44.9 in adult, unspecified obesity type (HCC) Suzanne Long is currently in the action stage of change. As such, her goal is to continue with weight loss efforts. She has agreed to on the Category 3 Plan.   We discussed the following exercise goals today: For substantial health benefits, adults should do at least 150 minutes (2 hours and 30 minutes) a week of moderate-intensity, or 75 minutes (1 hour and 15 minutes) a week of vigorous-intensity aerobic physical activity, or an equivalent combination of moderate- and vigorous-intensity aerobic activity. Aerobic activity should be performed in episodes of at least 10 minutes, and preferably, it should be spread throughout the week. Adults should also include muscle-strengthening activities that involve all major muscle groups on 2 or more days a week.  We discussed the following behavioral modification strategies today: increasing lean protein intake, decreasing simple carbohydrates, increasing vegetables, increasing water intake and decreasing liquid calories.  Suzanne Long has agreed to follow-up with our clinic PRN. She was informed of the importance of frequent follow-up visits to maximize her success with intensive lifestyle modifications for her multiple health conditions.  Objective:   Blood pressure  106/71, pulse 69, temperature 98.1 F (36.7 C), temperature source Oral, height $RemoveBefo'5\' 8"'lvDWNnGVams$  (1.727 m), weight 267 lb (121.1 kg), SpO2 99 %. Body mass index is 40.6 kg/m.  General: Cooperative, alert, well developed, in no acute distress. HEENT: Conjunctivae and lids unremarkable. Neck: No thyromegaly.  Cardiovascular: Regular rhythm.  Lungs: Normal work of breathing. Extremities: No edema.  Neurologic: No focal deficits.   Lab Results  Component Value Date   CREATININE 0.83 05/31/2019   BUN 10 05/31/2019   NA 137 05/31/2019   K 4.3 05/31/2019   CL 99 05/31/2019   CO2 27 05/31/2019   Lab Results  Component Value Date   ALT 15 05/31/2019   AST 19 05/31/2019   ALKPHOS 106 05/31/2019   BILITOT 0.5 05/31/2019   Lab Results  Component Value Date   HGBA1C 5.4 05/31/2019   HGBA1C 5.3 03/04/2019   HGBA1C 5.4 07/27/2014   HGBA1C  06/01/2010    5.2 (NOTE)                                                                       According to the ADA Clinical Practice Recommendations  for 2011, when HbA1c is used as a screening test:   >=6.5%   Diagnostic of Diabetes Mellitus           (if abnormal result  is confirmed)  5.7-6.4%   Increased risk of developing Diabetes Mellitus  References:Diagnosis and Classification of Diabetes Mellitus,Diabetes KYHC,6237,62(GBTDV 1):S62-S69 and Standards of Medical Care in         Diabetes - 2011,Diabetes VOHY,0737,10  (Suppl 1):S11-S61.   HGBA1C  06/01/2010    5.1 (NOTE)                                                                       According to the ADA Clinical Practice Recommendations for 2011, when HbA1c is used as a screening test:   >=6.5%   Diagnostic of Diabetes Mellitus           (if abnormal result  is confirmed)  5.7-6.4%   Increased risk of developing Diabetes Mellitus  References:Diagnosis and Classification of Diabetes Mellitus,Diabetes GYIR,4854,62(VOJJK 1):S62-S69 and Standards of Medical Care in         Diabetes - 2011,Diabetes  KXFG,1829,93  (Suppl 1):S11-S61.   Lab Results  Component Value Date   INSULIN 12.0 05/31/2019   Lab Results  Component Value Date   TSH 1.120 05/31/2019   Lab Results  Component Value Date   CHOL 175 05/31/2019   HDL 51 05/31/2019   LDLCALC 112 (H) 05/31/2019   TRIG 65 05/31/2019   CHOLHDL 3.1 03/04/2019   Lab Results  Component Value Date   WBC 5.2 05/31/2019   HGB 15.1 05/31/2019   HCT 43.6 05/31/2019   MCV 89 05/31/2019   PLT 293 05/31/2019   Lab Results  Component Value Date   IRON 30 (L) 11/26/2007   TIBC 277 11/26/2007   FERRITIN 45 11/26/2007   Attestation Statements:   Reviewed by clinician on day of visit: allergies, medications, problem list, medical history, surgical history, family history, social history, and previous encounter notes.  This visit occurred during the SARS-CoV-2 public health emergency. Safety protocols were in place, including screening questions prior to the visit, additional usage of staff PPE, and extensive cleaning of exam room while observing appropriate contact time as indicated for disinfecting solutions. (CPT [id number])  I, Water quality scientist, CMA, am acting as transcriptionist for [employer], DO.  I have reviewed the above documentation for accuracy and completeness, and I agree with the above. Briscoe Deutscher, DO

## 2019-07-25 ENCOUNTER — Encounter (INDEPENDENT_AMBULATORY_CARE_PROVIDER_SITE_OTHER): Payer: Self-pay | Admitting: Family Medicine

## 2019-07-27 ENCOUNTER — Encounter (HOSPITAL_COMMUNITY): Payer: Self-pay

## 2019-07-27 ENCOUNTER — Ambulatory Visit (HOSPITAL_COMMUNITY)
Admission: EM | Admit: 2019-07-27 | Discharge: 2019-07-27 | Disposition: A | Discharge status: Home / Self Care | Attending: Family Medicine | Admitting: Family Medicine

## 2019-07-27 ENCOUNTER — Other Ambulatory Visit: Payer: Self-pay

## 2019-07-27 DIAGNOSIS — U071 COVID-19: Secondary | ICD-10-CM | POA: Diagnosis not present

## 2019-07-27 DIAGNOSIS — Z20822 Contact with and (suspected) exposure to covid-19: Secondary | ICD-10-CM | POA: Diagnosis not present

## 2019-07-27 DIAGNOSIS — R0981 Nasal congestion: Secondary | ICD-10-CM

## 2019-07-27 NOTE — ED Provider Notes (Signed)
[place]   [id number] 07/27/19 Arrival Time: 3151  ASSESSMENT & PLAN:  1. Exposure to COVID-19 virus   2. Nasal congestion      COVID-19 testing sent. See letter/work note on file for self-isolation guidelines.  Follow-up Information    Redmon, Barth Kirks, PA-C.   Specialty: Nurse Practitioner Why: As needed. Contact information: [address] [place] [address] [phone]           Reviewed expectations re: course of current medical issues. Questions answered. Outlined signs and symptoms indicating need for more acute intervention. Patient verbalized understanding. After Visit Summary given.   SUBJECTIVE: History from: patient. Suzanne Long is a 49 y.o. female who requests COVID-19 testing. Known COVID-19 contact: reports exposure between 1-2 weeks ago. Recent travel: none2. Denies: fever, cough, sore throat, difficulty breathing and headache. Mild nasal congestion; questions seasonal allergies. Normal PO intake without n/v/d.  ROS: As per HPI.   OBJECTIVE:  Vitals:   07/27/19 1028  BP: 130/90  Pulse: 70  Resp: 16  Temp: 98.9 F (37.2 C)  TempSrc: Oral  SpO2: 98%    General appearance: alert; no distress Eyes: PERRLA; EOMI; conjunctiva normal HENT: [place]; AT; mild nasal congestion Neck: supple  Lungs: speaks full sentences without difficulty; unlabored Extremities: no edema Skin: warm and dry Neurologic: normal gait Psychological: alert and cooperative; normal mood and affect  Labs:  Labs Reviewed  NOVEL CORONAVIRUS, NAA (HOSP ORDER, SEND-OUT TO REF LAB; TAT 18-24 HRS)      Allergies  Allergen Reactions  . Shellfish Allergy Anaphylaxis    Uncoded Allergy. Allergen: SHELLFISH  . Gadolinium Derivatives Nausea And Vomiting    Vomiting after 20cc multihance injection  . Iodine Itching  . Latex Rash    Past Medical History:  Diagnosis Date  . CHF (congestive heart failure) ([place])   . Dyspnea   . Hypertension    . Hypothyroidism   . Joint pain   . Multiple food allergies   . NICM (nonischemic cardiomyopathy) ([place])    a. Echo 2/17:  moderate LVH, EF 30-35%, diffuse HK, moderate LAE   Social History   Socioeconomic History  . Marital status: Married    Spouse name: [name]  . Number of children: 2  . Years of education: 1  . Highest education level: Not on file  Occupational History  . Occupation: unemployed  Tobacco Use  . Smoking status: Never Smoker  . Smokeless tobacco: Never Used  Substance and Sexual Activity  . Alcohol use: Yes    Alcohol/week: 0.0 standard drinks    Comment: Occasional  . Drug use: No  . Sexual activity: Not on file  Other Topics Concern  . Not on file  Social History Narrative   Patient is married husband name [name]    Patient has 2 children. Both have past away.    Patient is right handed.    Patient is unemployed    Patient has a high school education    Social Determinants of Radio broadcast assistant Strain:   . Difficulty of Paying Living Expenses: Not on file  Food Insecurity:   . Worried About Charity fundraiser in the Last Year: Not on file  . Ran Out of Food in the Last Year: Not on file  Transportation Needs:   . Lack of Transportation (Medical): Not on file  . Lack of Transportation (Non-Medical): Not on file  Physical Activity:   . Days of Exercise per Week: Not  on file  . Minutes of Exercise per Session: Not on file  Stress:   . Feeling of Stress : Not on file  Social Connections:   . Frequency of Communication with Friends and Family: Not on file  . Frequency of Social Gatherings with Friends and Family: Not on file  . Attends Religious Services: Not on file  . Active Member of Clubs or Organizations: Not on file  . Attends Archivist Meetings: Not on file  . Marital Status: Not on file  Intimate Partner Violence:   . Fear of Current or Ex-Partner: Not on file  . Emotionally Abused: Not on file  . Physically  Abused: Not on file  . Sexually Abused: Not on file   Family History  Problem Relation Age of Onset  . Diabetes Mother   . Hypertension Mother   . Depression Mother   . Obesity Mother   . Muscular dystrophy Brother   . Kidney failure Brother   . Muscular dystrophy Son   . Migraines Neg Hx   . Multiple sclerosis Neg Hx   . Thyroid disease Neg Hx    Past Surgical History:  Procedure Laterality Date  . MYOMECTOMY    . None       Vanessa Kick, MD 07/27/19 [phone]

## 2019-07-27 NOTE — ED Triage Notes (Signed)
Patient presents to Urgent Care with complaints of covid exposure since two weeks ago. Patient reports her allergies have been acting up and is not sure if it really is allergies or covid, has been taking mucinex and that has helped.

## 2019-07-27 NOTE — Discharge Instructions (Addendum)
You have been tested for COVID-19 today. °If your test returns positive, you will receive a phone call from [facility] regarding your results. °Negative test results are not called. °Both positive and negative results area always visible on MyChart. °If you do not have a MyChart account, sign up instructions are provided in your discharge papers. °Please do not hesitate to contact us should you have questions or concerns. ° °

## 2019-07-29 ENCOUNTER — Telehealth: Payer: Self-pay

## 2019-07-29 ENCOUNTER — Telehealth (HOSPITAL_COMMUNITY): Payer: Self-pay | Admitting: Emergency Medicine

## 2019-07-29 LAB — NOVEL CORONAVIRUS, NAA (HOSP ORDER, SEND-OUT TO REF LAB; TAT 18-24 HRS): SARS-CoV-2, NAA: DETECTED — AB

## 2019-07-29 NOTE — Telephone Encounter (Signed)
Pt called to get covid test results. Informed pt that her results were still pending.   Suzanne Long

## 2019-07-29 NOTE — Telephone Encounter (Signed)

## 2019-07-30 ENCOUNTER — Telehealth: Payer: Self-pay | Admitting: Nurse Practitioner

## 2019-07-30 NOTE — Telephone Encounter (Signed)
Called to discuss with Suzanne Long about Covid symptoms and the use of bamlanivimab, a monoclonal antibody infusion for those with mild to moderate Covid symptoms and at a high risk of hospitalization.     Pt is qualified for this infusion at the [facility] infusion center due to co-morbid conditions and/or a member of an at-risk group, however declines infusion at this time. States she is doing well outside of loss of smell.   Symptoms tier reviewed as well as criteria for ending isolation.  Symptoms reviewed that would warrant ED/Hospital evaluation. Preventative practices reviewed. Patient verbalized understanding. Patient advised to call back if she decides that she does want to get infusion or develops worsening symptoms. Callback number given. Patient advised to go to Urgent care or ED with severe symptoms. Last date she would be eligible for infusion is 08/05/19.   Patient Active Problem List   Diagnosis Date Noted  . Insulin resistance 07/22/2019  . Other hyperlipidemia 07/22/2019  . Vitamin D deficiency 07/22/2019  . Goiter 02/01/2016  . Chronic systolic CHF (congestive heart failure) ([place]) 11/22/2015  . Elevated troponin 05/29/2015  . CHF exacerbation ([place]) 05/29/2015  . Accelerated hypertension   . Noncompliance with medication regimen   . Papilledema 07/27/2014  . Acute on chronic combined systolic and diastolic congestive heart failure ([place]) 02/09/2014  . Essential hypertension 02/09/2014  . Class 3 severe obesity with serious comorbidity and body mass index (BMI) of 40.0 to 44.9 in adult ([place]) 02/09/2014  . Subclinical hypothyroidism 02/09/2014    Alda Lea, AGPCNP-BC Pager: [phone] Amion: Bjorn Pippin

## 2019-10-27 ENCOUNTER — Ambulatory Visit: Attending: Internal Medicine

## 2019-10-27 DIAGNOSIS — Z23 Encounter for immunization: Secondary | ICD-10-CM

## 2019-10-27 NOTE — Progress Notes (Signed)
   Covid-19 Vaccination Clinic  Name:  Suzanne Long    MRN: [id number] DOB: 1971/05/21  10/27/2019  [name] was observed post Covid-19 immunization for 15 minutes without incident. She was provided with Vaccine Information Sheet and instruction to access the V-Safe system.   [name] was instructed to call 911 with any severe reactions post vaccine: Marland Kitchen Difficulty breathing  . Swelling of face and throat  . A fast heartbeat  . A bad rash all over body  . Dizziness and weakness   Immunizations Administered    Name Date Dose VIS Date Route   Pfizer COVID-19 Vaccine 10/27/2019 10:12 AM 0.3 mL 06/24/2019 Intramuscular   Manufacturer: [place]   Lot: [id number]   [place]: [id number]

## 2019-11-21 ENCOUNTER — Ambulatory Visit: Attending: Internal Medicine

## 2019-11-21 DIAGNOSIS — Z23 Encounter for immunization: Secondary | ICD-10-CM

## 2019-11-21 NOTE — Progress Notes (Signed)
   Covid-19 Vaccination Clinic  Name:  Suzanne Long    MRN: [id number] DOB: 10-08-1970  11/21/2019  [name] was observed post Covid-19 immunization for 15 minutes without incident. She was provided with Vaccine Information Sheet and instruction to access the V-Safe system.   [name] was instructed to call 911 with any severe reactions post vaccine: Marland Kitchen Difficulty breathing  . Swelling of face and throat  . A fast heartbeat  . A bad rash all over body  . Dizziness and weakness   Immunizations Administered    Name Date Dose VIS Date Route   Pfizer COVID-19 Vaccine 11/21/2019  8:19 AM 0.3 mL 09/07/2018 Intramuscular   Manufacturer: [place]   Lot: [id number]   [place]: [id number]

## 2020-02-04 ENCOUNTER — Other Ambulatory Visit: Payer: Self-pay | Admitting: Interventional Cardiology

## 2020-08-31 ENCOUNTER — Telehealth: Payer: Self-pay | Admitting: Interventional Cardiology

## 2020-08-31 DIAGNOSIS — I428 Other cardiomyopathies: Secondary | ICD-10-CM

## 2020-08-31 MED ORDER — FUROSEMIDE 40 MG PO TABS: 40.0000 mg | ORAL_TABLET | Freq: Every day | ORAL | 1 refills | Status: DC

## 2020-08-31 MED ORDER — ENTRESTO 49-51 MG PO TABS: 1.0000 | ORAL_TABLET | Freq: Two times a day (BID) | ORAL | 1 refills | Status: DC

## 2020-08-31 NOTE — Telephone Encounter (Signed)
Returned call to pt . She has been made aware that her refills have been sent in for 30 days plus 1 refill to get her to her appt in April with Richardson Dopp, PA-C.

## 2020-08-31 NOTE — Telephone Encounter (Signed)
°*  STAT* If patient is at the pharmacy, call can be transferred to refill team.   1. Which medications need to be refilled? (please list name of each medication and dose if known) carvedilol (COREG) 25 MG tablet ENTRESTO 49-51 MG furosemide (LASIX) 40 MG tablet  2. Which pharmacy/location (including street and city if local pharmacy) is medication to be sent to? [facility], [place] - [address] AT [place]  3. Do they need a 30 day or 90 day supply? 30 day supply  Patient has an appt scheduled for 10/17/20 with Richardson Dopp.

## 2020-09-22 ENCOUNTER — Other Ambulatory Visit: Payer: Self-pay

## 2020-09-22 ENCOUNTER — Emergency Department (HOSPITAL_COMMUNITY)

## 2020-09-22 ENCOUNTER — Inpatient Hospital Stay (HOSPITAL_COMMUNITY)
Admission: EM | Admit: 2020-09-22 | Discharge: 2020-09-26 | DRG: 280 | Disposition: A | Discharge status: Home / Self Care | Attending: Cardiovascular Disease | Admitting: Cardiovascular Disease

## 2020-09-22 DIAGNOSIS — I4892 Unspecified atrial flutter: Secondary | ICD-10-CM | POA: Diagnosis present

## 2020-09-22 DIAGNOSIS — E039 Hypothyroidism, unspecified: Secondary | ICD-10-CM | POA: Diagnosis present

## 2020-09-22 DIAGNOSIS — I4891 Unspecified atrial fibrillation: Secondary | ICD-10-CM | POA: Diagnosis present

## 2020-09-22 DIAGNOSIS — Z91040 Latex allergy status: Secondary | ICD-10-CM | POA: Diagnosis not present

## 2020-09-22 DIAGNOSIS — I214 Non-ST elevation (NSTEMI) myocardial infarction: Secondary | ICD-10-CM | POA: Diagnosis not present

## 2020-09-22 DIAGNOSIS — Z8616 Personal history of COVID-19: Secondary | ICD-10-CM | POA: Diagnosis not present

## 2020-09-22 DIAGNOSIS — I483 Typical atrial flutter: Secondary | ICD-10-CM | POA: Diagnosis not present

## 2020-09-22 DIAGNOSIS — Z87892 Personal history of anaphylaxis: Secondary | ICD-10-CM | POA: Diagnosis not present

## 2020-09-22 DIAGNOSIS — I110 Hypertensive heart disease with heart failure: Principal | ICD-10-CM | POA: Diagnosis present

## 2020-09-22 DIAGNOSIS — Z8249 Family history of ischemic heart disease and other diseases of the circulatory system: Secondary | ICD-10-CM

## 2020-09-22 DIAGNOSIS — Z91013 Allergy to seafood: Secondary | ICD-10-CM

## 2020-09-22 DIAGNOSIS — I340 Nonrheumatic mitral (valve) insufficiency: Secondary | ICD-10-CM | POA: Diagnosis not present

## 2020-09-22 DIAGNOSIS — Z20822 Contact with and (suspected) exposure to covid-19: Secondary | ICD-10-CM | POA: Diagnosis present

## 2020-09-22 DIAGNOSIS — E559 Vitamin D deficiency, unspecified: Secondary | ICD-10-CM | POA: Diagnosis present

## 2020-09-22 DIAGNOSIS — I21A1 Myocardial infarction type 2: Secondary | ICD-10-CM | POA: Diagnosis present

## 2020-09-22 DIAGNOSIS — Z6841 Body Mass Index (BMI) 40.0 and over, adult: Secondary | ICD-10-CM

## 2020-09-22 DIAGNOSIS — Z888 Allergy status to other drugs, medicaments and biological substances status: Secondary | ICD-10-CM | POA: Diagnosis not present

## 2020-09-22 DIAGNOSIS — Z79899 Other long term (current) drug therapy: Secondary | ICD-10-CM | POA: Diagnosis not present

## 2020-09-22 DIAGNOSIS — R778 Other specified abnormalities of plasma proteins: Secondary | ICD-10-CM | POA: Diagnosis not present

## 2020-09-22 DIAGNOSIS — I10 Essential (primary) hypertension: Secondary | ICD-10-CM | POA: Diagnosis present

## 2020-09-22 DIAGNOSIS — I5043 Acute on chronic combined systolic (congestive) and diastolic (congestive) heart failure: Secondary | ICD-10-CM | POA: Diagnosis present

## 2020-09-22 DIAGNOSIS — Z7901 Long term (current) use of anticoagulants: Secondary | ICD-10-CM

## 2020-09-22 DIAGNOSIS — I428 Other cardiomyopathies: Secondary | ICD-10-CM | POA: Diagnosis present

## 2020-09-22 DIAGNOSIS — Z560 Unemployment, unspecified: Secondary | ICD-10-CM

## 2020-09-22 DIAGNOSIS — E7849 Other hyperlipidemia: Secondary | ICD-10-CM | POA: Diagnosis present

## 2020-09-22 LAB — BRAIN NATRIURETIC PEPTIDE: B Natriuretic Peptide: 872.5 pg/mL — ABNORMAL HIGH (ref 0.0–100.0)

## 2020-09-22 LAB — BASIC METABOLIC PANEL
Anion gap: 9 (ref 5–15)
BUN: 12 mg/dL (ref 6–20)
CO2: 27 mmol/L (ref 22–32)
Calcium: 9.3 mg/dL (ref 8.9–10.3)
Chloride: 103 mmol/L (ref 98–111)
Creatinine, Ser: 1.09 mg/dL — ABNORMAL HIGH (ref 0.44–1.00)
GFR, Estimated: >60 mL/min (ref >60)
Glucose, Bld: 137 mg/dL — ABNORMAL HIGH (ref 70–99)
Potassium: 3.9 mmol/L (ref 3.5–5.1)
Sodium: 139 mmol/L (ref 135–145)

## 2020-09-22 LAB — I-STAT BETA HCG BLOOD, ED (MC, WL, AP ONLY): I-stat hCG, quantitative: <5 m[IU]/mL (ref <5)

## 2020-09-22 LAB — RESP PANEL BY RT-PCR (FLU A&B, COVID) ARPGX2
Influenza A by PCR: NEGATIVE
Influenza B by PCR: NEGATIVE
SARS Coronavirus 2 by RT PCR: NEGATIVE

## 2020-09-22 LAB — CBC
HCT: 46.6 % — ABNORMAL HIGH (ref 36.0–46.0)
Hemoglobin: 14.8 g/dL (ref 12.0–15.0)
MCH: 29.5 pg (ref 26.0–34.0)
MCHC: 31.8 g/dL (ref 30.0–36.0)
MCV: 92.8 fL (ref 80.0–100.0)
Platelets: 299 10*3/uL (ref 150–400)
RBC: 5.02 MIL/uL (ref 3.87–5.11)
RDW: 13.6 % (ref 11.5–15.5)
WBC: 5.1 10*3/uL (ref 4.0–10.5)
nRBC: 0 % (ref 0.0–0.2)

## 2020-09-22 LAB — TSH: TSH: 4.445 u[IU]/mL (ref 0.350–4.500)

## 2020-09-22 LAB — HEPARIN LEVEL (UNFRACTIONATED): Heparin Unfractionated: <0.1 IU/mL — ABNORMAL LOW (ref 0.30–0.70)

## 2020-09-22 LAB — MAGNESIUM: Magnesium: 1.9 mg/dL (ref 1.7–2.4)

## 2020-09-22 LAB — TROPONIN I (HIGH SENSITIVITY)
Troponin I (High Sensitivity): 1705 ng/L (ref <18)
Troponin I (High Sensitivity): 4469 ng/L (ref <18)

## 2020-09-22 LAB — HIV ANTIBODY (ROUTINE TESTING W REFLEX): HIV Screen 4th Generation wRfx: NONREACTIVE

## 2020-09-22 MED ORDER — FUROSEMIDE 10 MG/ML IJ SOLN
40.0000 mg | Freq: Two times a day (BID) | INTRAMUSCULAR | Status: DC
  Administered 2020-09-22 – 2020-09-25 (×7): 40 mg via INTRAVENOUS
  Filled 2020-09-22 (×7): qty 4

## 2020-09-22 MED ORDER — HEPARIN BOLUS VIA INFUSION
4000.0000 [IU] | Freq: Once | INTRAVENOUS | Status: AC
  Administered 2020-09-22: 4000 [IU] via INTRAVENOUS
  Filled 2020-09-22: qty 4000

## 2020-09-22 MED ORDER — METOPROLOL TARTRATE 5 MG/5ML IV SOLN
5.0000 mg | Freq: Once | INTRAVENOUS | Status: AC
  Administered 2020-09-22: 5 mg via INTRAVENOUS
  Filled 2020-09-22: qty 5

## 2020-09-22 MED ORDER — HEPARIN (PORCINE) 25000 UT/250ML-% IV SOLN
1600.0000 [IU]/h | INTRAVENOUS | Status: DC
  Administered 2020-09-22: 1150 [IU]/h via INTRAVENOUS
  Administered 2020-09-23: 1600 [IU]/h via INTRAVENOUS
  Administered 2020-09-23: 1500 [IU]/h via INTRAVENOUS
  Filled 2020-09-22 (×4): qty 250

## 2020-09-22 MED ORDER — ACETAMINOPHEN 325 MG PO TABS: 650.0000 mg | ORAL_TABLET | ORAL | Status: DC | PRN

## 2020-09-22 MED ORDER — SACUBITRIL-VALSARTAN 49-51 MG PO TABS
1.0000 | ORAL_TABLET | Freq: Two times a day (BID) | ORAL | Status: DC
  Administered 2020-09-22 – 2020-09-25 (×8): 1 via ORAL
  Filled 2020-09-22 (×10): qty 1

## 2020-09-22 MED ORDER — METOPROLOL TARTRATE 25 MG PO TABS
25.0000 mg | ORAL_TABLET | Freq: Three times a day (TID) | ORAL | Status: DC
  Administered 2020-09-22 – 2020-09-23 (×3): 25 mg via ORAL
  Filled 2020-09-22 (×3): qty 1

## 2020-09-22 MED ORDER — ONDANSETRON HCL 4 MG/2ML IJ SOLN: 4.0000 mg | Freq: Four times a day (QID) | INTRAMUSCULAR | Status: DC | PRN

## 2020-09-22 MED ORDER — HEPARIN BOLUS VIA INFUSION
2500.0000 [IU] | Freq: Once | INTRAVENOUS | Status: AC
  Administered 2020-09-22: 2500 [IU] via INTRAVENOUS
  Filled 2020-09-22: qty 2500

## 2020-09-22 NOTE — H&P (Signed)
Cardiology Admission History and Physical:   Patient ID: Suzanne Long MRN: [id number]; DOB: 1970-09-28   Admission date: 09/22/2020  PCP:  Suzanne Long, [facility]  Cardiologist:  Suzanne Grooms, MD  Advanced Practice Provider:  No care team member to display Electrophysiologist:  None        Chief Complaint:  Atrial flutter  Patient Profile:   Suzanne Long is a 50 y.o. female with a hx of non-ishemic cardiomyopathy, hypertension and hypothyroidism who is being seen today for the evaluation of atrial flutter at the request of Suzanne Long.  History of Present Illness:   Suzanne Long reports being in her usual state of health until last night when she developed palpitations.  It is associated with chest and back tightness and shortness of breath.  She has otherwise been well.  She did recently start to try to walk more.  She is able to walk without much difficulty both does get short of breath more than in the past.  She attributed this to being underweight.  She has not noticed any lower extremity edema, orthopnea, or PND.  The only thing she is done differently is used a supplement that contains ginseng and turmeric.  She drinks 1 or 2 teas lately but otherwise does not have any significant caffeine intake.  She rarely drinks alcohol.  1 her symptoms are persistent she called EMS and came to the ED where she was found to be in atrial flutter with rapid ventricular response.  Rates were in the 120s.  High sensitivity troponin is elevated to 1,705-->4,469.  BNP 872.  SHe was started on IV heparin and given metoprolol $RemoveBeforeDE'5mg'rQPyBsRhhoWKPtP$  IV x1.   She last saw Suzanne Long virtually 02/2019 and was doing well.  She ha a repeat echo revealed worsening of her LVEF to 25-30% with normal RV function.  She had a LHC in 2011 when first diagnosed with heart failure and there was reportedly no signficant disease.  She had COVID-19 infection 07/2019 and reports mild symptoms..   Past  Medical History:  Diagnosis Date  . CHF (congestive heart failure) ([place])   . Dyspnea   . Hypertension   . Hypothyroidism   . Joint pain   . Multiple food allergies   . NICM (nonischemic cardiomyopathy) ([place])    a. Echo 2/17:  moderate LVH, EF 30-35%, diffuse HK, moderate LAE    Past Surgical History:  Procedure Laterality Date  . MYOMECTOMY    . None       Medications Prior to Admission: Prior to Admission medications   Medication Sig Start Date End Date Taking? Authorizing Provider  carvedilol (COREG) 25 MG tablet Take 1 tablet (25 mg total) by mouth 2 (two) times daily with a meal. 06/20/19  Yes Suzanne Crome, MD  furosemide (LASIX) 40 MG tablet Take 1 tablet (40 mg total) by mouth daily. 08/31/20  Yes Suzanne Crome, MD  polyvinyl alcohol (LIQUIFILM TEARS) 1.4 % ophthalmic solution Place 1 drop into both eyes 3 (three) times daily.   Yes [provider]  sacubitril-valsartan (ENTRESTO) 49-51 MG Take 1 tablet by mouth 2 (two) times daily. 08/31/20  Yes Suzanne Crome, MD  Vitamin D, Ergocalciferol, (DRISDOL) 1.25 MG (50000 UT) CAPS capsule Take 1 capsule (50,000 Units total) by mouth every 7 (seven) days. Patient not taking: Reported on 09/22/2020 07/19/19   Briscoe Deutscher, DO     Allergies:    Allergies  Allergen Reactions  .  Shellfish Allergy Anaphylaxis    Uncoded Allergy. Allergen: SHELLFISH  . Gadolinium Derivatives Nausea And Vomiting    Vomiting after 20cc multihance injection  . Iodine Itching  . Latex Rash    Social History:   Social History   Socioeconomic History  . Marital status: Married    Spouse name: Suzanne Long  . Number of children: 2  . Years of education: 2  . Highest education level: Not on file  Occupational History  . Occupation: unemployed  Tobacco Use  . Smoking status: Never Smoker  . Smokeless tobacco: Never Used  Vaping Use  . Vaping Use: Never used  Substance and Sexual Activity  . Alcohol use: Yes    Alcohol/week: 0.0 standard  drinks    Comment: Occasional  . Drug use: No  . Sexual activity: Not on file  Other Topics Concern  . Not on file  Social History Narrative   Patient is married husband name Suzanne Long    Patient has 2 children. Both have past away.    Patient is right handed.    Patient is unemployed    Patient has a high school education    Social Determinants of Radio broadcast assistant Strain: Not on file  Food Insecurity: Not on file  Transportation Needs: Not on file  Physical Activity: Not on file  Stress: Not on file  Social Connections: Not on file  Intimate Partner Violence: Not on file    Family History:   The patient's family history includes Depression in her mother; Diabetes in her mother; Hypertension in her mother; Kidney failure in her brother; Muscular dystrophy in her brother and son; Obesity in her mother. There is no history of Migraines, Multiple sclerosis, or Thyroid disease.    ROS:  Please see the history of present illness. All other ROS reviewed and negative.     Physical Exam/Data:   Vitals:   09/22/20 1200 09/22/20 1230 09/22/20 1300 09/22/20 1401  BP: (!) 139/108 (!) 133/114 (!) 153/116 (!) 141/117  Pulse: (!) 117 (!) 107 (!) 113 (!) 106  Resp: $Remo'20 15 18 16  'UFQVA$ Temp:   98.3 F (36.8 C)   TempSrc:   Oral   SpO2: 96% 99% 97% 98%  Weight:      Height:       No intake or output data in the 24 hours ending 09/22/20 1516 Last 3 Weights 09/22/2020 07/19/2019 06/21/2019  Weight (lbs) 285 lb 267 lb 270 lb  Weight (kg) 129.275 kg 121.11 kg 122.471 kg     VS:  BP (!) 141/117   Pulse (!) 106   Temp 98.3 F (36.8 C) (Oral)   Resp 16   Ht 5' 8.5" (1.74 m)   Wt 129.3 kg   SpO2 98%   BMI 42.70 kg/m  , BMI Body mass index is 42.7 kg/m. GENERAL:  Well appearing HEENT: Pupils equal round and reactive, fundi not visualized, oral mucosa unremarkable NECK:  No jugular venous distention, waveform within normal limits, carotid upstroke brisk and symmetric, no bruits, no  thyromegaly LUNGS:  Clear to auscultation bilaterally HEART:  Tachycardic.  Irregularly irregular.  PMI not displaced or sustained,S1 and S2 within normal limits, no S3, no S4, no clicks, no rubs, no murmurs ABD:  Flat, positive bowel sounds normal in frequency in pitch, no bruits, no rebound, no guarding, no midline pulsatile mass, no hepatomegaly, no splenomegaly EXT:  2 plus pulses throughout, no edema, no cyanosis no clubbing SKIN:  No rashes no  nodules NEURO:  Cranial nerves II through XII grossly intact, motor grossly intact throughout PSYCH:  Cognitively intact, oriented to person place and time   EKG:  The ECG that was done 09/22/20 was personally reviewed and demonstrates Atrial flutter.  Rate 117 bpm.  LAFB.  PVC.  Relevant CV Studies:  Echo 02/2019: 1. The left ventricle has severely reduced systolic function, with an  ejection fraction of 25-30%. The cavity size was severely dilated. Left  ventricular diastolic parameters were normal. Left ventrical global  hypokinesis without regional wall motion  abnormalities.  2. The right ventricle has normal systolic function. The cavity was  normal. There is no increase in right ventricular wall thickness.  3. Left atrial size was severely dilated.  4. The aortic valve is tricuspid. Moderate thickening of the aortic  valve. Mild calcification of the aortic valve.  5. The aorta is normal unless otherwise noted.   Echo 01/2016: Study Conclusions   - Left ventricle: The cavity size was severely dilated. Systolic  function was mildly to moderately reduced. The estimated ejection  fraction was 40%. Diffuse hypokinesis. Features are consistent  with a pseudonormal left ventricular filling pattern, with  concomitant abnormal relaxation and increased filling pressure  (grade 2 diastolic dysfunction).  - Pulmonic valve: There was trivial regurgitation.  - Pericardium, extracardiac: A trivial pericardial effusion was   identified posterior to the heart.   Laboratory Data:  High Sensitivity Troponin:   Recent Labs  Lab 09/22/20 0755 09/22/20 1026  TROPONINIHS 1,705* 4,469*      Chemistry Recent Labs  Lab 09/22/20 0755  NA 139  K 3.9  CL 103  CO2 27  GLUCOSE 137*  BUN 12  CREATININE 1.09*  CALCIUM 9.3  GFRNONAA >60  ANIONGAP 9    No results for input(s): PROT, ALBUMIN, AST, ALT, ALKPHOS, BILITOT in the last 168 hours. Hematology Recent Labs  Lab 09/22/20 0755  WBC 5.1  RBC 5.02  HGB 14.8  HCT 46.6*  MCV 92.8  MCH 29.5  MCHC 31.8  RDW 13.6  PLT 299   BNP Recent Labs  Lab 09/22/20 0755  BNP 872.5*    DDimer No results for input(s): DDIMER in the last 168 hours.   Radiology/Studies:  DG Chest Portable 1 View  Result Date: 09/22/2020 CLINICAL DATA:  Chest pain and shortness of breath EXAM: PORTABLE CHEST 1 VIEW COMPARISON:  May 29, 2015 FINDINGS: No edema or airspace opacity. There is cardiomegaly with pulmonary vascularity within normal limits. Mild prominence noted in the right hilar region. No pneumothorax. No bone lesions. IMPRESSION: Cardiomegaly. Mild prominence in the right hilar region may represent vascular prominence. As adenopathy potentially could present in this manner, would advise upright PA and lateral chest radiographs to further assess when patient is clinically able. Lungs clear. Electronically Signed   By: Lowella Grip III M.D.   On: 09/22/2020 08:57     Assessment and Plan:   #New onset atrial flutter: Heart rates poorly controlled.  She does report paroxysms of palpitations in the past.  Therefore is unclear how long she has had this rhythm.  We will will continue IV heparin.  Plan for TEE/cardioversion after left heart cath.  Hold her home carvedilol and start metoprolol 25 mg every 8 hours.  TSH is normal.  #Elevated troponin: Pattern of troponin elevation is concerning for ischemia.  Clinically it seems more like demand ischemia in the  setting of atrial flutter with rapid ventricular response.  However she does have  some exertional dyspnea and chest/back tightness.  It has been over a decade since she had her last left heart cath.  We will plan for cath on Monday.  Continue heparin for now.  #Acute on chronic systolic and diastolic heart failure: # Hypertension:  LVEF most recently 25 to 30%.  She has nonischemic cardiomyopathy.  She does not appear very volume overloaded, but has been more short of breath and BNP is elevated.  We will gently diurese with IV Lasix.  Switching metoprolol instead of carvedilol as above.  Continue Entresto.  Consider ICD.  Risk Assessment/Risk Scores:     TIMI Risk Score for Unstable Angina or Non-ST Elevation MI:   The patient's TIMI risk score is 1, which indicates a 5% risk of all cause mortality, new or recurrent myocardial infarction or need for urgent revascularization in the next 14 days.  New York Heart Association (NYHA) Functional Class NYHA Class III  CHA2DS2-VASc Score = 3  This indicates a 3.2% annual risk of stroke. The patient's score is based upon: CHF History: Yes HTN History: Yes Diabetes History: No Stroke History: No Vascular Disease History: No Age Score: 0 Gender Score: 1      Severity of Illness: The appropriate patient status for this patient is OBSERVATION. Observation status is judged to be reasonable and necessary in order to provide the required intensity of service to ensure the patient's safety. The patient's presenting symptoms, physical exam findings, and initial radiographic and laboratory data in the context of their medical condition is felt to place them at decreased risk for further clinical deterioration. Furthermore, it is anticipated that the patient will be medically stable for discharge from the hospital within 2 midnights of admission. The following factors support the patient status of observation.   " The patient's presenting symptoms include  shortness of breath, palpitations. " The physical exam findings include tachycardia. " The initial radiographic and laboratory data are +troponin.     For questions or updates, please contact [place] Please consult [email or web address] for contact info under     Signed, Skeet Latch, MD  09/22/2020 3:16 PM

## 2020-09-22 NOTE — ED Notes (Signed)
Called pharmacy to check on status of heparin requested. Was told they would send it right now

## 2020-09-22 NOTE — ED Triage Notes (Signed)
Pt comes from home via EMS reports chest discomfort, radiating to back, accompanied with SOB, denies n/v.Marland Kitchen Pt took 324 of aspirin at home which brought pain to 1/10, EMS reports sinus tach on monitor rate of 140's. EMS attempted vagel maneuver which brought HR to 120's for a moment but quickly returned to 140's. Pt a/o x4. 20g IV established in [place]. BG 155/115. BGL= 220. 96% on RA.

## 2020-09-22 NOTE — ED Notes (Signed)
MD at bedside. 

## 2020-09-22 NOTE — ED Provider Notes (Signed)
[facility] EMERGENCY DEPARTMENT Provider Note   CSN: [id number] Arrival date & time: 09/22/20  0745     History Chief Complaint  Patient presents with  . Chest Pain    Suzanne Long is a 50 y.o. female.  Patient with chronic systolic congestive heart failure 2/2 NICM on Lasix, Coreg, Entresto, states compliant with these medications --presents the emergency department for evaluation of shortness of breath.  Patient states that she awoke her this morning with shortness of breath and wheezing.  She went to bed feeling well.  She woke up and felt clammy as well as a tight sensation in her back which is resolved.  No significant chest pain.  Due to her CHF history, EMS was called.  They found patient be tachycardic.  IV established.  She took aspirin at home.  No history of atrial fibrillation or regular heartbeats per patient.  No associated vomiting.  No abdominal pain, strokelike symptoms, weakness in the arms or the legs. The onset of this condition was acute. The course is improving. Aggravating factors: none. Alleviating factors: none.    Cath 06/03/2010 with normal coronaries, decreased EF.         Past Medical History:  Diagnosis Date  . CHF (congestive heart failure) ([place])   . Dyspnea   . Hypertension   . Hypothyroidism   . Joint pain   . Multiple food allergies   . NICM (nonischemic cardiomyopathy) ([place])    a. Echo 2/17:  moderate LVH, EF 30-35%, diffuse HK, moderate LAE    Patient Active Problem List   Diagnosis Date Noted  . Insulin resistance 07/22/2019  . Other hyperlipidemia 07/22/2019  . Vitamin D deficiency 07/22/2019  . Goiter 02/01/2016  . Chronic systolic CHF (congestive heart failure) ([place]) 11/22/2015  . Elevated troponin 05/29/2015  . CHF exacerbation ([place]) 05/29/2015  . Accelerated hypertension   . Noncompliance with medication regimen   . Papilledema 07/27/2014  . Acute on chronic combined systolic and diastolic congestive heart  failure ([place]) 02/09/2014  . Essential hypertension 02/09/2014  . Class 3 severe obesity with serious comorbidity and body mass index (BMI) of 40.0 to 44.9 in adult ([place]) 02/09/2014  . Subclinical hypothyroidism 02/09/2014    Past Surgical History:  Procedure Laterality Date  . MYOMECTOMY    . None       OB History    Gravida  2   Para      Term      Preterm      AB      Living  2     SAB      IAB      Ectopic      Multiple      Live Births              Family History  Problem Relation Age of Onset  . Diabetes Mother   . Hypertension Mother   . Depression Mother   . Obesity Mother   . Muscular dystrophy Brother   . Kidney failure Brother   . Muscular dystrophy Son   . Migraines Neg Hx   . Multiple sclerosis Neg Hx   . Thyroid disease Neg Hx     Social History   Tobacco Use  . Smoking status: Never Smoker  . Smokeless tobacco: Never Used  Vaping Use  . Vaping Use: Never used  Substance Use Topics  . Alcohol use: Yes    Alcohol/week: 0.0 standard drinks    Comment:  Occasional  . Drug use: No    Home Medications Prior to Admission medications   Medication Sig Start Date End Date Taking? Authorizing Provider  carvedilol (COREG) 25 MG tablet Take 1 tablet (25 mg total) by mouth 2 (two) times daily with a meal. 06/20/19   Belva Crome, MD  furosemide (LASIX) 40 MG tablet Take 1 tablet (40 mg total) by mouth daily. 08/31/20   Belva Crome, MD  sacubitril-valsartan (ENTRESTO) 49-51 MG Take 1 tablet by mouth 2 (two) times daily. 08/31/20   Belva Crome, MD  Vitamin D, Ergocalciferol, (DRISDOL) 1.25 MG (50000 UT) CAPS capsule Take 1 capsule (50,000 Units total) by mouth every 7 (seven) days. 07/19/19   Briscoe Deutscher, DO    Allergies    Shellfish allergy, Gadolinium derivatives, Iodine, and Latex  Review of Systems   Review of Systems  Constitutional: Positive for diaphoresis (felt clammy). Negative for fever.  Eyes: Negative for redness.   Respiratory: Positive for shortness of breath and wheezing. Negative for cough.   Cardiovascular: Negative for chest pain, palpitations and leg swelling.  Gastrointestinal: Negative for abdominal pain, nausea and vomiting.  Genitourinary: Negative for dysuria.  Musculoskeletal: Positive for back pain. Negative for neck pain.  Skin: Negative for rash.  Neurological: Negative for syncope and light-headedness.  Psychiatric/Behavioral: The patient is not nervous/anxious.     Physical Exam Updated Vital Signs BP (!) 149/127 (BP Location: Right Arm)   Pulse (!) 118   Temp 98.3 F (36.8 C) (Oral)   Resp 20   SpO2 98%   Physical Exam Vitals and nursing note reviewed.  Constitutional:      General: She is not in acute distress.    Appearance: She is well-developed.  HENT:     Head: Normocephalic and atraumatic.     Right Ear: External ear normal.     Left Ear: External ear normal.     Nose: Nose normal.  Eyes:     Conjunctiva/sclera: Conjunctivae normal.  Cardiovascular:     Rate and Rhythm: Regular rhythm. Tachycardia present.     Heart sounds: No murmur heard.   Pulmonary:     Effort: No tachypnea or respiratory distress.     Breath sounds: No wheezing, rhonchi or rales.  Abdominal:     Palpations: Abdomen is soft.     Tenderness: There is no abdominal tenderness. There is no guarding or rebound.  Musculoskeletal:     Cervical back: Normal range of motion and neck supple.     Right lower leg: No edema.     Left lower leg: No edema.  Skin:    General: Skin is warm and dry.     Findings: No rash.  Neurological:     General: No focal deficit present.     Mental Status: She is alert. Mental status is at baseline.     Motor: No weakness.  Psychiatric:        Mood and Affect: Mood normal.     ED Results / Procedures / Treatments   Labs (all labs ordered are listed, but only abnormal results are displayed) Labs Reviewed  BASIC METABOLIC PANEL - Abnormal; Notable for  the following components:      Result Value   Glucose, Bld 137 (*)    Creatinine, Ser 1.09 (*)    All other components within normal limits  CBC - Abnormal; Notable for the following components:   HCT 46.6 (*)    All other components within normal  limits  TROPONIN I (HIGH SENSITIVITY) - Abnormal; Notable for the following components:   Troponin I (High Sensitivity) 1,705 (*)    All other components within normal limits  RESP PANEL BY RT-PCR (FLU A&B, COVID) ARPGX2  TSH  MAGNESIUM  HEPARIN LEVEL (UNFRACTIONATED)  BRAIN NATRIURETIC PEPTIDE  I-STAT BETA HCG BLOOD, ED ([facility], [facility], [facility] ONLY)  TROPONIN I (HIGH SENSITIVITY)    EKG EKG Interpretation  Date/Time:  Saturday September 22 2020 09:46:25 EST Ventricular Rate:  117 PR Interval:    QRS Duration: 102 QT Interval:  328 QTC Calculation: 458 R Axis:   -53 Text Interpretation: Atrial flutter Ventricular premature complex Left anterior fascicular block Abnormal lateral Q waves Anterior infarct, old ST elevation, consider inferior injury persistent flutter/fib. no STEMI Confirmed by Charlesetta Shanks [id number]) on 09/22/2020 9:54:45 AM   Radiology DG Chest Portable 1 View  Result Date: 09/22/2020 CLINICAL DATA:  Chest pain and shortness of breath EXAM: PORTABLE CHEST 1 VIEW COMPARISON:  May 29, 2015 FINDINGS: No edema or airspace opacity. There is cardiomegaly with pulmonary vascularity within normal limits. Mild prominence noted in the right hilar region. No pneumothorax. No bone lesions. IMPRESSION: Cardiomegaly. Mild prominence in the right hilar region may represent vascular prominence. As adenopathy potentially could present in this manner, would advise upright PA and lateral chest radiographs to further assess when patient is clinically able. Lungs clear. Electronically Signed   By: Lowella Grip III M.D.   On: 09/22/2020 08:57    Procedures Procedures   Medications Ordered in ED Medications  heparin bolus via infusion 4,000  Units (has no administration in time range)  heparin ADULT infusion 100 units/mL (25000 units/248mL) (has no administration in time range)  metoprolol tartrate (LOPRESSOR) injection 5 mg (5 mg Intravenous Given 09/22/20 8841)    ED Course  I have reviewed the triage vital signs and the nursing notes.  Pertinent labs & imaging results that were available during my care of the patient were reviewed by me and considered in my medical decision making (see chart for details).  Patient seen and examined. Work-up initiated.  EKG reviewed.  At time of my exam, patient states that her breathing is fine denies any chest pain.  Labs ordered.  Vital signs reviewed and are as follows: BP (!) 148/122   Pulse (!) 112   Temp 98.3 F (36.8 C) (Oral)   Resp 16   SpO2 98%    D/w Dr. Johnney Killian. $RemoveBeforeDE'5mg'MCTBSMrbdGMTrva$  metorpolol ordered.   9:56 AM Troponin elevated. Ordered heparin. Repeat EKG reviewed with Dr. Johnney Killian.   Pt rechecked. Confirmed no current CP or other symptoms.   10:15 AM Discussed with Dr. Johney Frame of cardiology who will see patient.  BNP added. 2nd trop pending.   3:04 PM Troponin increasing. Awaiting cards reccs. Cards admitting.  Clinical Course as of 09/22/20 1504  Sat Sep 22, 2020  1458 Dr. Oval Linsey cards admitting? [MV]    Clinical Course User Index [MV] Eustaquio Maize, PA-C   MDM Rules/Calculators/A&P                          Admit.    Final Clinical Impression(s) / ED Diagnoses Final diagnoses:  Atrial flutter, unspecified type ([place])  Elevated troponin    Rx / DC Orders ED Discharge Orders    None       Carlisle Cater, PA-C 09/22/20 [place], MD 09/23/20 [phone]

## 2020-09-22 NOTE — Progress Notes (Signed)
[facility] for Heparin Indication: chest pain/ACS  Allergies  Allergen Reactions  . Shellfish Allergy Anaphylaxis    Uncoded Allergy. Allergen: SHELLFISH  . Gadolinium Derivatives Nausea And Vomiting    Vomiting after 20cc multihance injection  . Iodine Itching  . Latex Rash    Patient Measurements: Height: 5\' 9"  (175.3 cm) Weight: 127 kg (280 lb) (scale b) IBW/kg (Calculated) : 66.2 Heparin Dosing Weight: 95.7 kg  Vital Signs: Temp: 98.2 F (36.8 C) (03/12 1725) Temp Source: Oral (03/12 1725) BP: 157/119 (03/12 1725) Pulse Rate: 138 (03/12 1725)  Labs: Recent Labs    09/22/20 0755 09/22/20 1026 09/22/20 1832  HGB 14.8  --   --   HCT 46.6*  --   --   PLT 299  --   --   HEPARINUNFRC  --   --  <0.10*  CREATININE 1.09*  --   --   TROPONINIHS 1,705* 4,469*  --     Estimated Creatinine Clearance: 90.2 mL/min (A) (by C-G formula based on SCr of 1.09 mg/dL (H)).   Medical History: Past Medical History:  Diagnosis Date  . CHF (congestive heart failure) ([place])   . Dyspnea   . Hypertension   . Hypothyroidism   . Joint pain   . Multiple food allergies   . NICM (nonischemic cardiomyopathy) ([place])    a. Echo 2/17:  moderate LVH, EF 30-35%, diffuse HK, moderate LAE    Medications:  Scheduled:  . furosemide  40 mg Intravenous BID  . metoprolol tartrate  25 mg Oral Q8H  . sacubitril-valsartan  1 tablet Oral BID    Assessment: Patient is a 65 yof that presented to the ED with chest pain and sob. Patient was found to be in aflutter. Patient was also found to have an elevated trop level > 1000. Pharmacy has been asked to dose heparin at this time for ACS.   Initial heparin level undetectable, no issues with infusion per RN or bleeding issues.  Goal of Therapy:  Heparin level 0.3-0.7 units/ml Monitor platelets by anticoagulation protocol: Yes   Plan:  -Heparin bolus 2500 units x1 -Increase heparin to 1500 units/h -Recheck  heparin level in 6h   Arrie Senate, PharmD, [place], [place] Clinical Pharmacist [phone] Please check AMION for all [facility] numbers 09/22/2020

## 2020-09-22 NOTE — ED Notes (Signed)
Phone report called to [facility] RN, all questions answered.

## 2020-09-22 NOTE — Progress Notes (Signed)
[facility] for Heparin Indication: chest pain/ACS  Allergies  Allergen Reactions  . Shellfish Allergy Anaphylaxis    Uncoded Allergy. Allergen: SHELLFISH  . Gadolinium Derivatives Nausea And Vomiting    Vomiting after 20cc multihance injection  . Iodine Itching  . Latex Rash    Patient Measurements: Height: 5' 8.5" (174 cm) Weight: 129.3 kg (285 lb) IBW/kg (Calculated) : 65.05 Heparin Dosing Weight: 95.7 kg  Vital Signs: Temp: 98.3 F (36.8 C) (03/12 0758) Temp Source: Oral (03/12 0758) BP: 131/109 (03/12 1000) Pulse Rate: 137 (03/12 1000)  Labs: Recent Labs    09/22/20 0755  HGB 14.8  HCT 46.6*  PLT 299  CREATININE 1.09*  TROPONINIHS 1,705*    Estimated Creatinine Clearance: 90.5 mL/min (A) (by C-G formula based on SCr of 1.09 mg/dL (H)).   Medical History: Past Medical History:  Diagnosis Date  . CHF (congestive heart failure) ([place])   . Dyspnea   . Hypertension   . Hypothyroidism   . Joint pain   . Multiple food allergies   . NICM (nonischemic cardiomyopathy) ([place])    a. Echo 2/17:  moderate LVH, EF 30-35%, diffuse HK, moderate LAE    Medications:  Scheduled:  . heparin  4,000 Units Intravenous Once    Assessment: Patient is a 64 yof that presented to the ED with chest pain and sob. Patient was found to be in aflutter. Patient was also found to have an elevated trop level > 1000. Pharmacy has been asked to dose heparin at this time for ACS.  Goal of Therapy:  Heparin level 0.3-0.7 units/ml Monitor platelets by anticoagulation protocol: Yes   Plan:  - Heparin bolus 4000 units IV x 1 dose - Heparin drip @ 1150 units/hr - Heparin level in ~ 6 hours  - Monitor patient for s/s of bleeding and CBC while on heparin   Duanne Limerick PharmD. BCPS  09/22/2020,10:12 AM

## 2020-09-22 NOTE — Plan of Care (Signed)
New Admit with increased Heart Denies Light-headiness or Dizziness. Gave Scheduled Meds. With a Heart rate importment from 140s to 110s.

## 2020-09-22 NOTE — ED Provider Notes (Signed)
I provided a substantive portion of the care of this patient.  I personally performed the entirety of the medical decision making for this encounter.  EKG Interpretation  Date/Time:  Saturday September 22 2020 07:58:37 EST Ventricular Rate:  118 PR Interval:    QRS Duration: 110 QT Interval:  336 QTC Calculation: 469 R Axis:   -75 Text Interpretation: Atrial flutter Multiple ventricular premature complexes Left anterior fascicular block Probable anterolateral infarct, age indeterm Baseline wander in lead(s) V5 agree, flutter/fib new compared to previous tracing Confirmed by Charlesetta Shanks [phone]) on 09/22/2020 9:43:45 AM  Patient reports she has had some increasing shortness of breath with exertion recently.  This morning she got up and noted that her heart was racing as well.  She denies any chest pain but feels that she has had heavy achiness in the back and shoulders "like sore muscles".  No lower extremity swelling or calf pain.  No prior history of atrial fib or flutter  Patient is alert and nontoxic.  Mental status clear.  Heart irregularly irregular tachycardic no appreciable rub murmur gallop.  Lungs are clear.  Abdomen soft and nondistended.  No lower extremity edema.  Calf soft nontender.  Skin warm and dry.  Patient is alert and appropriate, speech normal, movements coordinated purposeful without focal neurologic deficit.  I agree with plan of management   Charlesetta Shanks, MD 09/22/20 [phone]

## 2020-09-22 NOTE — ED Notes (Signed)
Spouse at bedside. Patient remains alert/oriented. No distress.

## 2020-09-22 NOTE — Progress Notes (Signed)
   09/22/20 1725  Assess: MEWS Score  Temp 98.2 F (36.8 C)  BP (!) 157/119  Pulse Rate (!) 138  Resp 16  SpO2 99 %  Assess: MEWS Score  MEWS Temp 0  MEWS Systolic 0  MEWS Pulse 3  MEWS RR 0  MEWS LOC 0  MEWS Score 3  MEWS Score Color Yellow  Assess: if the MEWS score is Yellow or Red  Were vital signs taken at a resting state? Yes  Focused Assessment No change from prior assessment  Early Detection of Sepsis Score *See Row Information* Low  MEWS guidelines implemented *See Row Information* No, previously yellow, continue vital signs every 4 hours  Treat  MEWS Interventions Administered scheduled meds/treatments  Pain Scale 0-10  Pain Score 0  Pain Type Acute pain  Pain Location Chest  Take Vital Signs  Increase Vital Sign Frequency  Yellow: Q 2hr X 2 then Q 4hr X 2, if remains yellow, continue Q 4hrs  Escalate  MEWS: Escalate Yellow: discuss with charge nurse/RN and consider discussing with provider and RRT  Notify: Charge Nurse/RN  Name of Charge Nurse/RN Notified Dianna Rn  Date Charge Nurse/RN Notified 09/22/20  Time Charge Nurse/RN Notified 1749  Notify: Rapid Response  Name of Rapid Response RN Notified n/a  Document  Patient Outcome Not stable and remains on department  Progress note created (see row info) Yes  Patient was given Scheduled meds.

## 2020-09-23 ENCOUNTER — Encounter (HOSPITAL_COMMUNITY): Payer: Self-pay | Admitting: Cardiovascular Disease

## 2020-09-23 ENCOUNTER — Other Ambulatory Visit: Payer: Self-pay

## 2020-09-23 DIAGNOSIS — I483 Typical atrial flutter: Secondary | ICD-10-CM | POA: Diagnosis not present

## 2020-09-23 DIAGNOSIS — I5043 Acute on chronic combined systolic (congestive) and diastolic (congestive) heart failure: Secondary | ICD-10-CM | POA: Diagnosis not present

## 2020-09-23 LAB — BASIC METABOLIC PANEL
Anion gap: 9 (ref 5–15)
BUN: 8 mg/dL (ref 6–20)
CO2: 26 mmol/L (ref 22–32)
Calcium: 8.9 mg/dL (ref 8.9–10.3)
Chloride: 103 mmol/L (ref 98–111)
Creatinine, Ser: 0.97 mg/dL (ref 0.44–1.00)
GFR, Estimated: >60 mL/min (ref >60)
Glucose, Bld: 114 mg/dL — ABNORMAL HIGH (ref 70–99)
Potassium: 3.8 mmol/L (ref 3.5–5.1)
Sodium: 138 mmol/L (ref 135–145)

## 2020-09-23 LAB — CBC
HCT: 43.3 % (ref 36.0–46.0)
Hemoglobin: 14 g/dL (ref 12.0–15.0)
MCH: 29.4 pg (ref 26.0–34.0)
MCHC: 32.3 g/dL (ref 30.0–36.0)
MCV: 90.8 fL (ref 80.0–100.0)
Platelets: 258 10*3/uL (ref 150–400)
RBC: 4.77 MIL/uL (ref 3.87–5.11)
RDW: 13.7 % (ref 11.5–15.5)
WBC: 5.5 10*3/uL (ref 4.0–10.5)
nRBC: 0 % (ref 0.0–0.2)

## 2020-09-23 LAB — LIPID PANEL
Cholesterol: 170 mg/dL (ref 0–200)
HDL: 45 mg/dL (ref >40)
LDL Cholesterol: 109 mg/dL — ABNORMAL HIGH (ref 0–99)
Total CHOL/HDL Ratio: 3.8 RATIO
Triglycerides: 80 mg/dL (ref <150)
VLDL: 16 mg/dL (ref 0–40)

## 2020-09-23 LAB — HEPARIN LEVEL (UNFRACTIONATED)
Heparin Unfractionated: 0.35 IU/mL (ref 0.30–0.70)
Heparin Unfractionated: 0.3 IU/mL (ref 0.30–0.70)

## 2020-09-23 MED ORDER — SODIUM CHLORIDE 0.9 % IV SOLN: 250.0000 mL | INTRAVENOUS | Status: DC | PRN

## 2020-09-23 MED ORDER — METOPROLOL TARTRATE 25 MG PO TABS
37.5000 mg | ORAL_TABLET | Freq: Three times a day (TID) | ORAL | Status: DC
  Administered 2020-09-23 – 2020-09-26 (×9): 37.5 mg via ORAL
  Filled 2020-09-23 (×10): qty 1

## 2020-09-23 MED ORDER — ASPIRIN 81 MG PO CHEW
81.0000 mg | CHEWABLE_TABLET | ORAL | Status: AC
  Administered 2020-09-24: 81 mg via ORAL
  Filled 2020-09-23: qty 1

## 2020-09-23 MED ORDER — SODIUM CHLORIDE 0.9 % IV SOLN: INTRAVENOUS | Status: DC

## 2020-09-23 MED ORDER — SODIUM CHLORIDE 0.9% FLUSH
3.0000 mL | Freq: Two times a day (BID) | INTRAVENOUS | Status: DC
  Administered 2020-09-23 – 2020-09-24 (×3): 3 mL via INTRAVENOUS

## 2020-09-23 MED ORDER — SODIUM CHLORIDE 0.9% FLUSH: 3.0000 mL | INTRAVENOUS | Status: DC | PRN

## 2020-09-23 NOTE — Progress Notes (Signed)
Progress Note  Patient Name: Suzanne Long Date of Encounter: 09/23/2020  [facility] Cardiologist: Suzanne Noe, MD   Subjective   Feeling better.  Breathing is improving.   Inpatient Medications    Scheduled Meds: . furosemide  40 mg Intravenous BID  . metoprolol tartrate  25 mg Oral Q8H  . sacubitril-valsartan  1 tablet Oral BID   Continuous Infusions: . heparin 1,500 Units/hr (09/23/20 0020)   PRN Meds: acetaminophen, ondansetron (ZOFRAN) IV   Vital Signs    Vitals:   09/23/20 0024 09/23/20 0417 09/23/20 0907 09/23/20 1034  BP:  119/89 97/74 119/90  Pulse:  96 (!) 105   Resp:  18 18   Temp:  98.5 F (36.9 C) 98.3 F (36.8 C)   TempSrc:  Oral Oral   SpO2:  97% 98%   Weight: 126.6 kg     Height:        Intake/Output Summary (Last 24 hours) at 09/23/2020 1120 Last data filed at 09/23/2020 0418 Gross per 24 hour  Intake 600 ml  Output 2200 ml  Net -1600 ml   Last 3 Weights 09/23/2020 09/22/2020 09/22/2020  Weight (lbs) 279 lb 280 lb 285 lb  Weight (kg) 126.554 kg 127.007 kg 129.275 kg      Telemetry    Atrial flutter.  Rate 100s.  PVCs. - Personally Reviewed  ECG    Atrial flutter.  Rate 117 bpm.  PVC.  LAFB - Personally Reviewed  Physical Exam   VS:  BP 119/90   Pulse (!) 105   Temp 98.3 F (36.8 C) (Oral)   Resp 18   Ht 5\' 9"  (1.753 m)   Wt 126.6 kg   SpO2 98%   BMI 41.20 kg/m  , BMI Body mass index is 41.2 kg/m. GENERAL:  Well appearing.  No acute distress HEENT: Pupils equal round and reactive, fundi not visualized, oral mucosa unremarkable NECK:  No jugular venous distention, waveform within normal limits, carotid upstroke brisk and symmetric, no bruits, no thyromegaly LYMPHATICS:  No cervical adenopathy LUNGS:  Clear to auscultation bilaterally HEART:  Irregularly irregular.  PMI not displaced or sustained,S1 and S2 within normal limits, no S3, no S4, no clicks, no rubs, no murmurs ABD:  Flat, positive bowel sounds normal in  frequency in pitch, no bruits, no rebound, no guarding, no midline pulsatile mass, no hepatomegaly, no splenomegaly EXT:  2 plus pulses throughout, no edema, no cyanosis no clubbing SKIN:  No rashes no nodules NEURO:  Cranial nerves II through XII grossly intact, motor grossly intact throughout PSYCH:  Cognitively intact, oriented to person place and time  Labs    High Sensitivity Troponin:   Recent Labs  Lab 09/22/20 0755 09/22/20 1026  TROPONINIHS 1,705* 4,469*      Chemistry Recent Labs  Lab 09/22/20 0755 09/23/20 0433  NA 139 138  K 3.9 3.8  CL 103 103  CO2 27 26  GLUCOSE 137* 114*  BUN 12 8  CREATININE 1.09* 0.97  CALCIUM 9.3 8.9  GFRNONAA >60 >60  ANIONGAP 9 9     Hematology Recent Labs  Lab 09/22/20 0755 09/23/20 0433  WBC 5.1 5.5  RBC 5.02 4.77  HGB 14.8 14.0  HCT 46.6* 43.3  MCV 92.8 90.8  MCH 29.5 29.4  MCHC 31.8 32.3  RDW 13.6 13.7  PLT 299 258    BNP Recent Labs  Lab 09/22/20 0755  BNP 872.5*     DDimer No results for input(s): DDIMER in  the last 168 hours.   Radiology    DG Chest Portable 1 View  Result Date: 09/22/2020 CLINICAL DATA:  Chest pain and shortness of breath EXAM: PORTABLE CHEST 1 VIEW COMPARISON:  May 29, 2015 FINDINGS: No edema or airspace opacity. There is cardiomegaly with pulmonary vascularity within normal limits. Mild prominence noted in the right hilar region. No pneumothorax. No bone lesions. IMPRESSION: Cardiomegaly. Mild prominence in the right hilar region may represent vascular prominence. As adenopathy potentially could present in this manner, would advise upright PA and lateral chest radiographs to further assess when patient is clinically able. Lungs clear. Electronically Signed   By: Lowella Grip III M.D.   On: 09/22/2020 08:57    Cardiac Studies   Echo 03/04/19: 1. The left ventricle has severely reduced systolic function, with an  ejection fraction of 25-30%. The cavity size was severely dilated.  Left  ventricular diastolic parameters were normal. Left ventrical global  hypokinesis without regional wall motion  abnormalities.  2. The right ventricle has normal systolic function. The cavity was  normal. There is no increase in right ventricular wall thickness.  3. Left atrial size was severely dilated.  4. The aortic valve is tricuspid. Moderate thickening of the aortic  valve. Mild calcification of the aortic valve.  5. The aorta is normal unless otherwise noted.   Patient Profile     Suzanne Long a 50 y.o.femalewith a hx of non-ishemic cardiomyopathy, hypertension and hypothyroidismhere with new onset atrial flutter and elevated troponin.   Assessment & Plan    # New onset atrial flutter: Heart rate are better controlled on metoprolol.  Will increase to 37.5 mg q8h. She does report paroxysms of palpitations in the past.  Therefore is unclear how long she has had this rhythm.  We will will continue IV heparin.  Plan for TEE/cardioversion after left heart cath. TSH is normal.   # Elevated troponin: Pattern of troponin elevation is concerning for ischemia.  Clinically it seems more like demand ischemia in the setting of atrial flutter with rapid ventricular response.  However she does have some exertional dyspnea and chest/back tightness.  It has been over a decade since she had her last left heart cath.  We will plan for cath on Monday.  Continue heparin for now.  Risks and benefits of cardiac catheterization have been discussed with the patient.  The patient understands that risks included but are not limited to stroke (1 in 1000), death (1 in 67), kidney failure [usually temporary] (1 in 500), bleeding (1 in 200), allergic reaction [possibly serious] (1 in 200). The patient understands and agrees to proceed.   # Hyperlipidemia:  LDL mildly elevated at 109. Decide on need for statin after caht.    #Acute on chronic systolic and diastolic heart failure: #  Hypertension:  LVEF most recently 25 to 30%.  She has nonischemic cardiomyopathy.  She does not appear very volume overloaded, but has been more short of breath and BNP is elevated.  She was started with gentle diuresis and is net -1.6L yesteday.  Renal function stable.  Continue furosemide.  Needs consideration for ICD.  Switched to metoprolol instead of carvedilol for rate control.  Continue Entresto.    For questions or updates, please contact [facility] Please consult [email or web address] for contact info under        Signed, Skeet Latch, MD  09/23/2020, 11:20 AM

## 2020-09-23 NOTE — Progress Notes (Signed)
   09/23/20 0907  Assess: MEWS Score  Temp 98.3 F (36.8 C)  BP 97/74  Pulse Rate (!) 105  Resp 18  SpO2 98 %  O2 Device Room Air  Assess: MEWS Score  MEWS Temp 0  MEWS Systolic 1  MEWS Pulse 1  MEWS RR 0  MEWS LOC 0  MEWS Score 2  MEWS Score Color Yellow  Assess: if the MEWS score is Yellow or Red  Were vital signs taken at a resting state? Yes  Focused Assessment No change from prior assessment  Early Detection of Sepsis Score *See Row Information* Low  MEWS guidelines implemented *See Row Information* No, previously yellow, continue vital signs every 4 hours  Treat  MEWS Interventions Administered scheduled meds/treatments  Pain Scale 0-10  Pain Score 0  Document  Progress note created (see row info) Yes   Patient remains in Afib/Aflutter with rate >105. MD aware and will continue current medications unless rate increases

## 2020-09-23 NOTE — H&P (View-Only) (Signed)
Progress Note  Patient Name: Suzanne Long Date of Encounter: 09/23/2020  [facility] Cardiologist: Sinclair Grooms, MD   Subjective   Feeling better.  Breathing is improving.   Inpatient Medications    Scheduled Meds: . furosemide  40 mg Intravenous BID  . metoprolol tartrate  25 mg Oral Q8H  . sacubitril-valsartan  1 tablet Oral BID   Continuous Infusions: . heparin 1,500 Units/hr (09/23/20 0020)   PRN Meds: acetaminophen, ondansetron (ZOFRAN) IV   Vital Signs    Vitals:   09/23/20 0024 09/23/20 0417 09/23/20 0907 09/23/20 1034  BP:  119/89 97/74 119/90  Pulse:  96 (!) 105   Resp:  18 18   Temp:  98.5 F (36.9 C) 98.3 F (36.8 C)   TempSrc:  Oral Oral   SpO2:  97% 98%   Weight: 126.6 kg     Height:        Intake/Output Summary (Last 24 hours) at 09/23/2020 1120 Last data filed at 09/23/2020 0418 Gross per 24 hour  Intake 600 ml  Output 2200 ml  Net -1600 ml   Last 3 Weights 09/23/2020 09/22/2020 09/22/2020  Weight (lbs) 279 lb 280 lb 285 lb  Weight (kg) 126.554 kg 127.007 kg 129.275 kg      Telemetry    Atrial flutter.  Rate 100s.  PVCs. - Personally Reviewed  ECG    Atrial flutter.  Rate 117 bpm.  PVC.  LAFB - Personally Reviewed  Physical Exam   VS:  BP 119/90   Pulse (!) 105   Temp 98.3 F (36.8 C) (Oral)   Resp 18   Ht $R'5\' 9"'AO$  (1.753 m)   Wt 126.6 kg   SpO2 98%   BMI 41.20 kg/m  , BMI Body mass index is 41.2 kg/m. GENERAL:  Well appearing.  No acute distress HEENT: Pupils equal round and reactive, fundi not visualized, oral mucosa unremarkable NECK:  No jugular venous distention, waveform within normal limits, carotid upstroke brisk and symmetric, no bruits, no thyromegaly LYMPHATICS:  No cervical adenopathy LUNGS:  Clear to auscultation bilaterally HEART:  Irregularly irregular.  PMI not displaced or sustained,S1 and S2 within normal limits, no S3, no S4, no clicks, no rubs, no murmurs ABD:  Flat, positive bowel sounds normal in  frequency in pitch, no bruits, no rebound, no guarding, no midline pulsatile mass, no hepatomegaly, no splenomegaly EXT:  2 plus pulses throughout, no edema, no cyanosis no clubbing SKIN:  No rashes no nodules NEURO:  Cranial nerves II through XII grossly intact, motor grossly intact throughout PSYCH:  Cognitively intact, oriented to person place and time  Labs    High Sensitivity Troponin:   Recent Labs  Lab 09/22/20 0755 09/22/20 1026  TROPONINIHS 1,705* 4,469*      Chemistry Recent Labs  Lab 09/22/20 0755 09/23/20 0433  NA 139 138  K 3.9 3.8  CL 103 103  CO2 27 26  GLUCOSE 137* 114*  BUN 12 8  CREATININE 1.09* 0.97  CALCIUM 9.3 8.9  GFRNONAA >60 >60  ANIONGAP 9 9     Hematology Recent Labs  Lab 09/22/20 0755 09/23/20 0433  WBC 5.1 5.5  RBC 5.02 4.77  HGB 14.8 14.0  HCT 46.6* 43.3  MCV 92.8 90.8  MCH 29.5 29.4  MCHC 31.8 32.3  RDW 13.6 13.7  PLT 299 258    BNP Recent Labs  Lab 09/22/20 0755  BNP 872.5*     DDimer No results for input(s): DDIMER in  the last 168 hours.   Radiology    DG Chest Portable 1 View  Result Date: 09/22/2020 CLINICAL DATA:  Chest pain and shortness of breath EXAM: PORTABLE CHEST 1 VIEW COMPARISON:  May 29, 2015 FINDINGS: No edema or airspace opacity. There is cardiomegaly with pulmonary vascularity within normal limits. Mild prominence noted in the right hilar region. No pneumothorax. No bone lesions. IMPRESSION: Cardiomegaly. Mild prominence in the right hilar region may represent vascular prominence. As adenopathy potentially could present in this manner, would advise upright PA and lateral chest radiographs to further assess when patient is clinically able. Lungs clear. Electronically Signed   By: Lowella Grip III M.D.   On: 09/22/2020 08:57    Cardiac Studies   Echo 03/04/19: 1. The left ventricle has severely reduced systolic function, with an  ejection fraction of 25-30%. The cavity size was severely dilated.  Left  ventricular diastolic parameters were normal. Left ventrical global  hypokinesis without regional wall motion  abnormalities.  2. The right ventricle has normal systolic function. The cavity was  normal. There is no increase in right ventricular wall thickness.  3. Left atrial size was severely dilated.  4. The aortic valve is tricuspid. Moderate thickening of the aortic  valve. Mild calcification of the aortic valve.  5. The aorta is normal unless otherwise noted.   Patient Profile     Suzanne Tatumis a 50 y.o.femalewith a hx of non-ishemic cardiomyopathy, hypertension and hypothyroidismhere with new onset atrial flutter and elevated troponin.   Assessment & Plan    # New onset atrial flutter: Heart rate are better controlled on metoprolol.  Will increase to 37.5 mg q8h. She does report paroxysms of palpitations in the past.  Therefore is unclear how long she has had this rhythm.  We will will continue IV heparin.  Plan for TEE/cardioversion after left heart cath. TSH is normal.   # Elevated troponin: Pattern of troponin elevation is concerning for ischemia.  Clinically it seems more like demand ischemia in the setting of atrial flutter with rapid ventricular response.  However she does have some exertional dyspnea and chest/back tightness.  It has been over a decade since she had her last left heart cath.  We will plan for cath on Monday.  Continue heparin for now.  Risks and benefits of cardiac catheterization have been discussed with the patient.  The patient understands that risks included but are not limited to stroke (1 in 1000), death (1 in 58), kidney failure [usually temporary] (1 in 500), bleeding (1 in 200), allergic reaction [possibly serious] (1 in 200). The patient understands and agrees to proceed.   # Hyperlipidemia:  LDL mildly elevated at 109. Decide on need for statin after caht.    #Acute on chronic systolic and diastolic heart failure: #  Hypertension:  LVEF most recently 25 to 30%.  She has nonischemic cardiomyopathy.  She does not appear very volume overloaded, but has been more short of breath and BNP is elevated.  She was started with gentle diuresis and is net -1.6L yesteday.  Renal function stable.  Continue furosemide.  Needs consideration for ICD.  Switched to metoprolol instead of carvedilol for rate control.  Continue Entresto.    For questions or updates, please contact [place] Please consult [email or web address] for contact info under        Signed, Skeet Latch, MD  09/23/2020, 11:20 AM

## 2020-09-23 NOTE — Progress Notes (Signed)
[place] for Heparin Indication: chest pain/ACS  Allergies  Allergen Reactions  . Shellfish Allergy Anaphylaxis    Uncoded Allergy. Allergen: SHELLFISH  . Gadolinium Derivatives Nausea And Vomiting    Vomiting after 20cc multihance injection  . Iodine Itching  . Latex Rash    Patient Measurements: Height: 5\' 9"  (175.3 cm) Weight: 126.6 kg (279 lb) IBW/kg (Calculated) : 66.2 Heparin Dosing Weight: 95.7 kg  Vital Signs: Temp: 98.5 F (36.9 C) (03/13 0417) Temp Source: Oral (03/13 0417) BP: 119/89 (03/13 0417) Pulse Rate: 96 (03/13 0417)  Labs: Recent Labs    09/22/20 0755 09/22/20 1026 09/22/20 1832 09/23/20 0433  HGB 14.8  --   --  14.0  HCT 46.6*  --   --  43.3  PLT 299  --   --  258  HEPARINUNFRC  --   --  <0.10* 0.35  CREATININE 1.09*  --   --   --   TROPONINIHS 1,705* 4,469*  --   --     Estimated Creatinine Clearance: 90.1 mL/min (A) (by C-G formula based on SCr of 1.09 mg/dL (H)).   Medical History: Past Medical History:  Diagnosis Date  . CHF (congestive heart failure) ([place])   . Dyspnea   . Hypertension   . Hypothyroidism   . Joint pain   . Multiple food allergies   . NICM (nonischemic cardiomyopathy) ([place])    a. Echo 2/17:  moderate LVH, EF 30-35%, diffuse HK, moderate LAE    Medications:  Scheduled:  . furosemide  40 mg Intravenous BID  . metoprolol tartrate  25 mg Oral Q8H  . sacubitril-valsartan  1 tablet Oral BID    Assessment: Patient is a 85 yof that presented to the ED with chest pain and sob. Patient was found to be in aflutter. Patient was also found to have an elevated trop level > 1000. Pharmacy has been asked to dose heparin at this time for ACS.   Initial heparin level undetectable, no issues with infusion per RN or bleeding issues.  3/13 AM update:  Heparin level therapeutic after rate increase  Goal of Therapy:  Heparin level 0.3-0.7 units/ml Monitor platelets by anticoagulation  protocol: Yes   Plan:  -Cont heparin 1500 units/hr -1200 heparin level  Narda Bonds, PharmD, BCPS Clinical Pharmacist Phone: [phone]

## 2020-09-23 NOTE — Progress Notes (Signed)
[facility] for Heparin Indication: chest pain/ACS  Allergies  Allergen Reactions  . Shellfish Allergy Anaphylaxis    Uncoded Allergy. Allergen: SHELLFISH  . Gadolinium Derivatives Nausea And Vomiting    Vomiting after 20cc multihance injection  . Iodine Itching  . Latex Rash    Patient Measurements: Height: 5\' 9"  (175.3 cm) Weight: 126.6 kg (279 lb) IBW/kg (Calculated) : 66.2 Heparin Dosing Weight: 95.7 kg  Vital Signs: Temp: 98.3 F (36.8 C) (03/13 0907) Temp Source: Oral (03/13 0907) BP: 119/90 (03/13 1034) Pulse Rate: 105 (03/13 0907)  Labs: Recent Labs    09/22/20 0755 09/22/20 1026 09/22/20 1832 09/23/20 0433 09/23/20 1122  HGB 14.8  --   --  14.0  --   HCT 46.6*  --   --  43.3  --   PLT 299  --   --  258  --   HEPARINUNFRC  --   --  <0.10* 0.35 0.30  CREATININE 1.09*  --   --  0.97  --   TROPONINIHS 1,705* 4,469*  --   --   --     Estimated Creatinine Clearance: 101.2 mL/min (by C-G formula based on SCr of 0.97 mg/dL).   Medical History: Past Medical History:  Diagnosis Date  . CHF (congestive heart failure) ([place])   . Dyspnea   . Hypertension   . Hypothyroidism   . Joint pain   . Multiple food allergies   . NICM (nonischemic cardiomyopathy) ([place])    a. Echo 2/17:  moderate LVH, EF 30-35%, diffuse HK, moderate LAE    Medications:  Scheduled:  . furosemide  40 mg Intravenous BID  . metoprolol tartrate  37.5 mg Oral Q8H  . sacubitril-valsartan  1 tablet Oral BID  . sodium chloride flush  3 mL Intravenous Q12H    Assessment: Patient is a 62 yof that presented to the ED with chest pain and sob. Patient was found to be in aflutter. Patient was also found to have an elevated trop level > 1000. Pharmacy has been asked to dose heparin at this time for ACS.   Confirmatory HL is therapeutic, on the low end, will increase slightly and check level tomorrow morning. CBC is stable and no overt bleeding or infusion  issues noted.  Goal of Therapy:  Heparin level 0.3-0.7 units/ml Monitor platelets by anticoagulation protocol: Yes   Plan:  -Increase heparin to 1600 units/hr - daily heparin level and CBC - monitor for bleeding  Norina Buzzard, PharmD PGY1 Pharmacy Resident 09/23/2020 12:08 PM

## 2020-09-24 ENCOUNTER — Encounter (HOSPITAL_COMMUNITY)
Admission: EM | Disposition: A | Discharge status: Home / Self Care | Payer: Self-pay | Source: Home / Self Care | Attending: Cardiovascular Disease

## 2020-09-24 ENCOUNTER — Encounter (HOSPITAL_COMMUNITY): Payer: Self-pay | Admitting: Cardiovascular Disease

## 2020-09-24 DIAGNOSIS — I214 Non-ST elevation (NSTEMI) myocardial infarction: Secondary | ICD-10-CM | POA: Diagnosis not present

## 2020-09-24 DIAGNOSIS — I483 Typical atrial flutter: Secondary | ICD-10-CM | POA: Diagnosis not present

## 2020-09-24 DIAGNOSIS — I5043 Acute on chronic combined systolic (congestive) and diastolic (congestive) heart failure: Secondary | ICD-10-CM | POA: Diagnosis not present

## 2020-09-24 DIAGNOSIS — R778 Other specified abnormalities of plasma proteins: Secondary | ICD-10-CM | POA: Diagnosis not present

## 2020-09-24 HISTORY — PX: LEFT HEART CATH AND CORONARY ANGIOGRAPHY: CATH118249

## 2020-09-24 LAB — CBC
HCT: 43.6 % (ref 36.0–46.0)
Hemoglobin: 14.5 g/dL (ref 12.0–15.0)
MCH: 30 pg (ref 26.0–34.0)
MCHC: 33.3 g/dL (ref 30.0–36.0)
MCV: 90.1 fL (ref 80.0–100.0)
Platelets: 239 10*3/uL (ref 150–400)
RBC: 4.84 MIL/uL (ref 3.87–5.11)
RDW: 13.3 % (ref 11.5–15.5)
WBC: 6.7 10*3/uL (ref 4.0–10.5)
nRBC: 0 % (ref 0.0–0.2)

## 2020-09-24 LAB — HEPARIN LEVEL (UNFRACTIONATED): Heparin Unfractionated: 0.37 IU/mL (ref 0.30–0.70)

## 2020-09-24 SURGERY — LEFT HEART CATH AND CORONARY ANGIOGRAPHY
Anesthesia: LOCAL

## 2020-09-24 MED ORDER — HEPARIN SODIUM (PORCINE) 1000 UNIT/ML IJ SOLN
INTRAMUSCULAR | Status: AC
  Filled 2020-09-24: qty 1

## 2020-09-24 MED ORDER — VERAPAMIL HCL 2.5 MG/ML IV SOLN
INTRA_ARTERIAL | Status: DC | PRN
  Administered 2020-09-24: 10 mL via INTRA_ARTERIAL

## 2020-09-24 MED ORDER — HEPARIN (PORCINE) IN NACL 1000-0.9 UT/500ML-% IV SOLN
INTRAVENOUS | Status: AC
  Filled 2020-09-24: qty 500

## 2020-09-24 MED ORDER — LIDOCAINE HCL (PF) 1 % IJ SOLN
INTRAMUSCULAR | Status: DC | PRN
  Administered 2020-09-24: 2 mL via INTRADERMAL

## 2020-09-24 MED ORDER — SODIUM CHLORIDE 0.9 % IV SOLN: 250.0000 mL | INTRAVENOUS | Status: DC | PRN

## 2020-09-24 MED ORDER — FENTANYL CITRATE (PF) 100 MCG/2ML IJ SOLN
INTRAMUSCULAR | Status: DC | PRN
  Administered 2020-09-24: 25 ug via INTRAVENOUS

## 2020-09-24 MED ORDER — LABETALOL HCL 5 MG/ML IV SOLN: 10.0000 mg | INTRAVENOUS | Status: AC | PRN

## 2020-09-24 MED ORDER — METHYLPREDNISOLONE SODIUM SUCC 125 MG IJ SOLR
125.0000 mg | Freq: Once | INTRAMUSCULAR | Status: AC
  Administered 2020-09-24: 125 mg via INTRAVENOUS
  Filled 2020-09-24: qty 2

## 2020-09-24 MED ORDER — FENTANYL CITRATE (PF) 100 MCG/2ML IJ SOLN
INTRAMUSCULAR | Status: AC
  Filled 2020-09-24: qty 2

## 2020-09-24 MED ORDER — ONDANSETRON HCL 4 MG/2ML IJ SOLN: 4.0000 mg | Freq: Four times a day (QID) | INTRAMUSCULAR | Status: DC | PRN

## 2020-09-24 MED ORDER — HEPARIN (PORCINE) 25000 UT/250ML-% IV SOLN
1600.0000 [IU]/h | INTRAVENOUS | Status: AC
  Administered 2020-09-25 – 2020-09-26 (×2): 1600 [IU]/h via INTRAVENOUS
  Filled 2020-09-24 (×3): qty 250

## 2020-09-24 MED ORDER — HYDRALAZINE HCL 20 MG/ML IJ SOLN: 10.0000 mg | INTRAMUSCULAR | Status: AC | PRN

## 2020-09-24 MED ORDER — SODIUM CHLORIDE 0.9% FLUSH: 3.0000 mL | INTRAVENOUS | Status: DC | PRN

## 2020-09-24 MED ORDER — MIDAZOLAM HCL 2 MG/2ML IJ SOLN
INTRAMUSCULAR | Status: DC | PRN
  Administered 2020-09-24: 1 mg via INTRAVENOUS

## 2020-09-24 MED ORDER — HEPARIN (PORCINE) IN NACL 1000-0.9 UT/500ML-% IV SOLN
INTRAVENOUS | Status: DC | PRN
  Administered 2020-09-24 (×2): 500 mL

## 2020-09-24 MED ORDER — SODIUM CHLORIDE 0.9% FLUSH
3.0000 mL | Freq: Two times a day (BID) | INTRAVENOUS | Status: DC
  Administered 2020-09-25: 3 mL via INTRAVENOUS

## 2020-09-24 MED ORDER — SODIUM CHLORIDE 0.9 % IV SOLN: INTRAVENOUS | Status: AC

## 2020-09-24 MED ORDER — MORPHINE SULFATE (PF) 2 MG/ML IV SOLN: 2.0000 mg | INTRAVENOUS | Status: DC | PRN

## 2020-09-24 MED ORDER — MIDAZOLAM HCL 2 MG/2ML IJ SOLN
INTRAMUSCULAR | Status: AC
  Filled 2020-09-24: qty 2

## 2020-09-24 MED ORDER — NITROGLYCERIN 1 MG/10 ML FOR IR/CATH LAB
INTRA_ARTERIAL | Status: AC
  Filled 2020-09-24: qty 10

## 2020-09-24 MED ORDER — LIDOCAINE HCL (PF) 1 % IJ SOLN
INTRAMUSCULAR | Status: AC
  Filled 2020-09-24: qty 30

## 2020-09-24 MED ORDER — VERAPAMIL HCL 2.5 MG/ML IV SOLN
INTRAVENOUS | Status: AC
  Filled 2020-09-24: qty 2

## 2020-09-24 MED ORDER — ACETAMINOPHEN 325 MG PO TABS: 650.0000 mg | ORAL_TABLET | ORAL | Status: DC | PRN

## 2020-09-24 MED ORDER — IOHEXOL 350 MG/ML SOLN
INTRAVENOUS | Status: DC | PRN
  Administered 2020-09-24: 55 mL via INTRA_ARTERIAL

## 2020-09-24 MED ORDER — DIPHENHYDRAMINE HCL 50 MG/ML IJ SOLN
25.0000 mg | Freq: Once | INTRAMUSCULAR | Status: AC
  Administered 2020-09-24: 25 mg via INTRAVENOUS
  Filled 2020-09-24: qty 1

## 2020-09-24 MED ORDER — HEPARIN SODIUM (PORCINE) 1000 UNIT/ML IJ SOLN
INTRAMUSCULAR | Status: DC | PRN
  Administered 2020-09-24: 6000 [IU] via INTRAVENOUS

## 2020-09-24 SURGICAL SUPPLY — 12 items
CATH INFINITI 5FR ANG PIGTAIL (CATHETERS) ×1 IMPLANT
CATH OPTITORQUE TIG 4.0 5F (CATHETERS) ×1 IMPLANT
DEVICE RAD COMP TR BAND LRG (VASCULAR PRODUCTS) ×1 IMPLANT
GLIDESHEATH SLEND A-KIT 6F 22G (SHEATH) ×1 IMPLANT
GUIDEWIRE INQWIRE 1.5J.035X260 (WIRE) IMPLANT
INQWIRE 1.5J .035X260CM (WIRE) ×2
KIT HEART LEFT (KITS) ×2 IMPLANT
PACK CARDIAC CATHETERIZATION (CUSTOM PROCEDURE TRAY) ×2 IMPLANT
SYR MEDRAD MARK 7 150ML (SYRINGE) ×2 IMPLANT
TRANSDUCER W/STOPCOCK (MISCELLANEOUS) ×2 IMPLANT
TUBING CIL FLEX 10 FLL-RA (TUBING) ×2 IMPLANT
WIRE HI TORQ VERSACORE-J 145CM (WIRE) ×1 IMPLANT

## 2020-09-24 NOTE — Progress Notes (Addendum)
Progress Note  Patient Name: Suzanne Long Date of Encounter: 09/24/2020  [facility] Cardiologist: Lesleigh Noe, MD   Subjective   Feeling better.  Breathing is improving.   Inpatient Medications    Scheduled Meds: . furosemide  40 mg Intravenous BID  . metoprolol tartrate  37.5 mg Oral Q8H  . sacubitril-valsartan  1 tablet Oral BID  . sodium chloride flush  3 mL Intravenous Q12H   Continuous Infusions: . sodium chloride    . sodium chloride 10 mL/hr at 09/24/20 0517  . heparin 1,600 Units/hr (09/24/20 0517)   PRN Meds: sodium chloride, acetaminophen, ondansetron (ZOFRAN) IV, sodium chloride flush   Vital Signs    Vitals:   09/24/20 0407 09/24/20 0917 09/24/20 0918 09/24/20 1141  BP: 104/80 96/71 111/70 101/77  Pulse: 96 94 81 91  Resp: 19 20    Temp: 98.7 F (37.1 C) 98.1 F (36.7 C)    TempSrc: Oral Oral    SpO2: 99% 97% 100%   Weight:      Height:        Intake/Output Summary (Last 24 hours) at 09/24/2020 1222 Last data filed at 09/24/2020 0517 Gross per 24 hour  Intake 890.13 ml  Output 2000 ml  Net -1109.87 ml   Last 3 Weights 09/24/2020 09/23/2020 09/22/2020  Weight (lbs) 275 lb 12.8 oz 279 lb 280 lb  Weight (kg) 125.102 kg 126.554 kg 127.007 kg      Telemetry    Atrial flutter.  Rate 100s.  PVCs. - Personally Reviewed  ECG    Atrial flutter.  Rate 117 bpm.  PVC.  LAFB - Personally Reviewed  Physical Exam   VS:  BP 101/77   Pulse 91   Temp 98.1 F (36.7 C) (Oral)   Resp 20   Ht 5\' 9"  (1.753 m)   Wt 125.1 kg   SpO2 100%   BMI 40.73 kg/m  , BMI Body mass index is 40.73 kg/m. GENERAL:  Well appearing.  No acute distress HEENT: Pupils equal round and reactive, fundi not visualized, oral mucosa unremarkable NECK:  No jugular venous distention, waveform within normal limits, carotid upstroke brisk and symmetric, no bruits, no thyromegaly LYMPHATICS:  No cervical adenopathy LUNGS:  Clear to auscultation bilaterally HEART:   Irregularly irregular.  PMI not displaced or sustained,S1 and S2 within normal limits, no S3, no S4, no clicks, no rubs, no murmurs ABD:  Flat, positive bowel sounds normal in frequency in pitch, no bruits, no rebound, no guarding, no midline pulsatile mass, no hepatomegaly, no splenomegaly EXT:  2 plus pulses throughout, no edema, no cyanosis no clubbing SKIN:  No rashes no nodules NEURO:  Cranial nerves II through XII grossly intact, motor grossly intact throughout PSYCH:  Cognitively intact, oriented to person place and time  Labs    High Sensitivity Troponin:   Recent Labs  Lab 09/22/20 0755 09/22/20 1026  TROPONINIHS 1,705* 4,469*      Chemistry Recent Labs  Lab 09/22/20 0755 09/23/20 0433  NA 139 138  K 3.9 3.8  CL 103 103  CO2 27 26  GLUCOSE 137* 114*  BUN 12 8  CREATININE 1.09* 0.97  CALCIUM 9.3 8.9  GFRNONAA >60 >60  ANIONGAP 9 9     Hematology Recent Labs  Lab 09/22/20 0755 09/23/20 0433 09/24/20 0312  WBC 5.1 5.5 6.7  RBC 5.02 4.77 4.84  HGB 14.8 14.0 14.5  HCT 46.6* 43.3 43.6  MCV 92.8 90.8 90.1  MCH 29.5 29.4  30.0  MCHC 31.8 32.3 33.3  RDW 13.6 13.7 13.3  PLT 299 258 239    BNP Recent Labs  Lab 09/22/20 0755  BNP 872.5*     DDimer No results for input(s): DDIMER in the last 168 hours.   Radiology    No results found.  Cardiac Studies   Echo 03/04/19: 1. The left ventricle has severely reduced systolic function, with an  ejection fraction of 25-30%. The cavity size was severely dilated. Left  ventricular diastolic parameters were normal. Left ventrical global  hypokinesis without regional wall motion  abnormalities.  2. The right ventricle has normal systolic function. The cavity was  normal. There is no increase in right ventricular wall thickness.  3. Left atrial size was severely dilated.  4. The aortic valve is tricuspid. Moderate thickening of the aortic  valve. Mild calcification of the aortic valve.  5. The aorta is  normal unless otherwise noted.   Patient Profile     Suzanne Tatumis a 50 y.o.femalewith a hx of non-ishemic cardiomyopathy, hypertension and hypothyroidismhere with new onset atrial flutter and elevated troponin.   Assessment & Plan    # New onset atrial flutter: Heart rate are better controlled on metoprolol 37.5 mg q8h.  We will will continue IV heparin.  Plan for TEE/cardioversion after left heart cath. TSH is normal.   # NSTEMI: Troponin 1705->4469, values higher than we would expect just with demand ischemia, she is waiting left and right cardiac cath today.   This family history of premature coronary artery disease in her uncle and grandfather who both had heart attacks in their early 82s,  # Hyperlipidemia:  LDL mildly elevated at 109. Decide on need for statin after cath.    #Acute on chronic systolic and diastolic heart failure: # Hypertension:  LVEF most recently 25 to 30%.  Interestingly her brother and 2 of her children died of Duchenne myopathy. BNP is elevated.  She was started with gentle diuresis and is net -1.1 L yesteday.  Renal function stable, creatinine 1.09.  Continue furosemide.  Needs consideration for ICD.  Switched to metoprolol instead of carvedilol for rate control.  Continue Entresto.    Consider Jardiance and spironolactone at discharge.  For questions or updates, please contact [place] Please consult [email or web address] for contact info under     Signed, Ena Dawley, MD  09/24/2020, 12:22 PM

## 2020-09-24 NOTE — Progress Notes (Addendum)
ANTICOAGULATION CONSULT NOTE - Follow Up Consult  Pharmacy Consult for Heparin Indication: chest pain/ACS  Allergies  Allergen Reactions  . Shellfish Allergy Anaphylaxis    Uncoded Allergy. Allergen: SHELLFISH  . Gadolinium Derivatives Nausea And Vomiting    Vomiting after 20cc multihance injection  . Iodine Itching  . Latex Rash    Patient Measurements: Height: 5\' 9"  (175.3 cm) Weight: 125.1 kg (275 lb 12.8 oz) IBW/kg (Calculated) : 66.2 Heparin Dosing Weight: 97 kg  Vital Signs: Temp: 98.7 F (37.1 C) (03/14 0407) Temp Source: Oral (03/14 0407) BP: 104/80 (03/14 0407) Pulse Rate: 96 (03/14 0407)  Labs: Recent Labs    09/22/20 0755 09/22/20 1026 09/22/20 1832 09/23/20 0433 09/23/20 1122 09/24/20 0312  HGB 14.8  --   --  14.0  --  14.5  HCT 46.6*  --   --  43.3  --  43.6  PLT 299  --   --  258  --  239  HEPARINUNFRC  --   --    < > 0.35 0.30 0.37  CREATININE 1.09*  --   --  0.97  --   --   TROPONINIHS 1,705* 4,469*  --   --   --   --    < > = values in this interval not displayed.    Estimated Creatinine Clearance: 100.5 mL/min (by C-G formula based on SCr of 0.97 mg/dL).  Assessment: Patient is a 42 yof that presented to the ED with chest pain and shortness of breath. Patient was found to be in aflutter. Patient was also found to have an elevated trop level > 1000. Pharmacy has been asked to dose heparin  for ACS.    Heparin level therapeutic (0.37) on 1600 units/hr. CBC stable.  For cardiac cath and TEE/cardioversion.  Goal of Therapy:  Heparin level 0.3-0.7 units/ml Monitor platelets by anticoagulation protocol: Yes   Plan:   Continue heparin drip at 1600 units/hr  Daily heparin level and CBC  Follow up post-procedures.  Arty Baumgartner, [place] 09/24/2020,7:36 AM   Addendum:  Heparin to resume 4 hrs after sheath out.  TR band deflation in process. RN reports some oozing with last deflation, so going slowly.  Anticipating TR band off by ~5pm.      Will resume heparin at ~9pm tonight at prior rate of 1600 units/hr.   Next heparin level and CBC in am.  Consuello Masse, RPh 09/24/2020 4:14 PM

## 2020-09-24 NOTE — Progress Notes (Signed)
Heart Failure Nurse Navigator Progress Note  PCP: Lennie Odor, PA  PCP-Cardiologist: Linard Millers., MD Admission Diagnosis: A. flutter Admitted from: home with spouse  Presentation:   Suzanne Long presented with chest pain and SHOB. New A flutter noted and acute on chronic heart failure. Pt resting comfortably in bed s/p LHC with R arm elevated over chest, TR band intact-educated to limit use of R arm/wrist for 24 hours. Spouse and Aunt at bedside, [place] for family to stay in room during interview, per pt.    ECHO/ LVEF: pending.  09/24/20 LHC LVEF estimate <25%, normal coronaries.  02/2019 25-30%.  2017 40%.   Clinical Course:  Past Medical History:  Diagnosis Date  . CHF (congestive heart failure) ([place])   . Dyspnea   . Hypertension   . Hypothyroidism   . Joint pain   . Multiple food allergies   . NICM (nonischemic cardiomyopathy) ([place])    a. Echo 2/17:  moderate LVH, EF 30-35%, diffuse HK, moderate LAE     Social History   Socioeconomic History  . Marital status: Married    Spouse name: [name]  . Number of children: 2  . Years of education: 76  . Highest education level: Not on file  Occupational History  . Occupation: unemployed  Tobacco Use  . Smoking status: Never Smoker  . Smokeless tobacco: Never Used  Vaping Use  . Vaping Use: Never used  Substance and Sexual Activity  . Alcohol use: Yes    Alcohol/week: 0.0 standard drinks    Comment: Occasional  . Drug use: No  . Sexual activity: Not on file  Other Topics Concern  . Not on file  Social History Narrative   Patient is married husband name [name]    Patient has 2 children. Both have past away.    Patient is right handed.    Patient is unemployed    Patient has a high school education    Social Determinants of Radio broadcast assistant Strain: Low Risk   . Difficulty of Paying Living Expenses: Not hard at all  Food Insecurity: No Food Insecurity  . Worried About Charity fundraiser in the Last Year:  Never true  . Ran Out of Food in the Last Year: Never true  Transportation Needs: No Transportation Needs  . Lack of Transportation (Medical): No  . Lack of Transportation (Non-Medical): No  Physical Activity: Inactive  . Days of Exercise per Week: 0 days  . Minutes of Exercise per Session: 0 min  Stress: Not on file  Social Connections: Not on file    High Risk Criteria for Readmission and/or Poor Patient Outcomes:  Heart failure hospital admissions (last 6 months): 1   No Show rate: 6%  Difficult social situation: no  Demonstrates medication adherence: no, pt stated she will not take her lasix as prescribed-doesn't forget or run out, but sometimes won't take it if she has "things to do", "can't be in the bathroom all day"  Primary Language: English  Literacy level: able to read/write and comprehend  Education Assessment and Provision:  Detailed education and instructions provided on heart failure disease management including the following:  Signs and symptoms of Heart Failure When to call the physician Importance of daily weights Low sodium diet Fluid restriction Medication management Anticipated future follow-up appointments  Patient education given on each of the above topics.  Patient acknowledges understanding via teach back method and acceptance of all instructions.  Education Materials:  "Living Better With  Heart Failure" Booklet, HF zone tool, & Daily Weight Tracker Tool.  Patient has scale at home: yes Patient has pill box at home: yes   Barriers of Care:   -medication compliance  Considerations/Referrals:   Referral made to Heart Failure Pharmacist Stewardship: yes, appreciated Referral made to Heart & Vascular TOC clinic: yes, will place closer to DC date.   Items for Follow-up on DC/TOC: -medication optimization -medication compliance -HF/med education - ? Regarding disability  Pricilla Holm, RN, BSN Heart Failure Nurse  Navigator [phone]

## 2020-09-24 NOTE — Interval H&P Note (Signed)
Cath Lab Visit (complete for each Cath Lab visit)  Clinical Evaluation Leading to the Procedure:   ACS: No.  Non-ACS:    Anginal Classification: CCS I  Anti-ischemic medical therapy: Minimal Therapy (1 class of medications)  Non-Invasive Test Results: No non-invasive testing performed  Prior CABG: No previous CABG      History and Physical Interval Note:  2/43/8365 4:27 PM  Suzanne Long  has presented today for surgery, with the diagnosis of NSTEMI.  The various methods of treatment have been discussed with the patient and family. After consideration of risks, benefits and other options for treatment, the patient has consented to  Procedure(s): LEFT HEART CATH AND CORONARY ANGIOGRAPHY (N/A) as a surgical intervention.  The patient's history has been reviewed, patient examined, no change in status, stable for surgery.  I have reviewed the patient's chart and labs.  Questions were answered to the patient's satisfaction.     [name]

## 2020-09-24 NOTE — Progress Notes (Signed)
Heart Failure Stewardship Pharmacist Progress Note   PCP: Lennie Odor, PA PCP-Cardiologist: Sinclair Grooms, MD    HPI:  50 yo F with PMH of NICM, HTN, and hypothyroidism. She presented to the ED on 09/22/20 with chest pain and shortness of breath. She was found to be in new onset atrial flutter and acute on chronic heart failure. Her last ECHO was done on 03/04/2019 and LVEF was 25-30% (down from 40% in 2017). LHC was done on 09/24/20 and found to have normal coronaries and estimated LVEF of <25%.  Current HF Medications: Furosemide 40 mg IV BID Metoprolol tartrate 37.5 mg q8h Entresto 49/51 mg BID  Prior to admission HF Medications: Furosemide 40 mg daily (patient states she only takes as needed) Carvedilol 25 mg BID Entresto 24/26 mg BID  Pertinent Lab Values: . Serum creatinine 0.97, BUN 8, Potassium 3.8, Sodium 138, BNP 872.5, Magnesium 1.9   Vital Signs: . Weight: 275 lbs (admission weight: 280 lbs) . Blood pressure: 120/90s  . Heart rate: 90s   Medication Assistance / Insurance Benefits Check: Does the patient have prescription insurance?  Yes Type of insurance plan: [place]:  Prior to admission outpatient pharmacy: [facility] Is the patient willing to use [facility] at discharge? Yes  Assessment: 1. Acute on chronic systolic CHF (EF 38%), due to NICM. NYHA class II symptoms. - Continue furosemide 40 mg IV BID - Continue metoprolol tartrate 37.5 mg q8h. Recommend to convert to metoprolol XL prior to discharge once optimal dose achieved. - Continue Entresto 49/51 mg BID - Consider adding spironolactone and SGLT2i prior to discharge   Plan: 1) Medication changes recommended at this time: - Continue current regimen - Pending SCr tomorrow, would add spironolactone or SGLT2i  2) Patient assistance: - None pending  3)  Education  - To be completed prior to discharge  Kerby Nora, PharmD, BCPS Heart Failure Stewardship  Pharmacist Phone [phone]

## 2020-09-24 NOTE — Plan of Care (Signed)
  Problem: Education: Goal: Knowledge of General Education information will improve Description Including pain rating scale, medication(s)/side effects and non-pharmacologic comfort measures Outcome: Progressing   Problem: Health Behavior/Discharge Planning: Goal: Ability to manage health-related needs will improve Outcome: Progressing   

## 2020-09-25 DIAGNOSIS — I483 Typical atrial flutter: Secondary | ICD-10-CM | POA: Diagnosis not present

## 2020-09-25 DIAGNOSIS — I428 Other cardiomyopathies: Secondary | ICD-10-CM

## 2020-09-25 DIAGNOSIS — I5043 Acute on chronic combined systolic (congestive) and diastolic (congestive) heart failure: Secondary | ICD-10-CM | POA: Diagnosis not present

## 2020-09-25 LAB — BASIC METABOLIC PANEL
Anion gap: 9 (ref 5–15)
BUN: 18 mg/dL (ref 6–20)
CO2: 31 mmol/L (ref 22–32)
Calcium: 9.5 mg/dL (ref 8.9–10.3)
Chloride: 99 mmol/L (ref 98–111)
Creatinine, Ser: 1.12 mg/dL — ABNORMAL HIGH (ref 0.44–1.00)
GFR, Estimated: >60 mL/min (ref >60)
Glucose, Bld: 137 mg/dL — ABNORMAL HIGH (ref 70–99)
Potassium: 4.1 mmol/L (ref 3.5–5.1)
Sodium: 139 mmol/L (ref 135–145)

## 2020-09-25 LAB — CBC
HCT: 50.2 % — ABNORMAL HIGH (ref 36.0–46.0)
Hemoglobin: 16.3 g/dL — ABNORMAL HIGH (ref 12.0–15.0)
MCH: 29.3 pg (ref 26.0–34.0)
MCHC: 32.5 g/dL (ref 30.0–36.0)
MCV: 90.3 fL (ref 80.0–100.0)
Platelets: 304 10*3/uL (ref 150–400)
RBC: 5.56 MIL/uL — ABNORMAL HIGH (ref 3.87–5.11)
RDW: 13.2 % (ref 11.5–15.5)
WBC: 8.1 10*3/uL (ref 4.0–10.5)
nRBC: 0 % (ref 0.0–0.2)

## 2020-09-25 LAB — HEPARIN LEVEL (UNFRACTIONATED): Heparin Unfractionated: 0.36 IU/mL (ref 0.30–0.70)

## 2020-09-25 MED ORDER — EMPAGLIFLOZIN 10 MG PO TABS
10.0000 mg | ORAL_TABLET | Freq: Every day | ORAL | Status: DC
  Administered 2020-09-25: 10 mg via ORAL
  Filled 2020-09-25 (×2): qty 1

## 2020-09-25 NOTE — Plan of Care (Signed)
  Problem: Clinical Measurements: Goal: Cardiovascular complication will be avoided Outcome: Progressing   

## 2020-09-25 NOTE — Progress Notes (Signed)
Heart Failure Stewardship Pharmacist Progress Note   PCP: Lennie Odor, PA PCP-Cardiologist: Sinclair Grooms, MD    HPI:  50 yo F with PMH of NICM, HTN, and hypothyroidism. She presented to the ED on 09/22/20 with chest pain and shortness of breath. She was found to be in new onset atrial flutter and acute on chronic heart failure. Her last ECHO was done on 03/04/2019 and LVEF was 25-30% (down from 40% in 2017). LHC was done on 09/24/20 and found to have normal coronaries and estimated LVEF of <25%.  Current HF Medications: Furosemide 40 mg IV BID Metoprolol tartrate 37.5 mg q8h Entresto 49/51 mg BID  Prior to admission HF Medications: Furosemide 40 mg daily (patient states she only takes as needed) Carvedilol 25 mg BID Entresto 24/26 mg BID  Pertinent Lab Values:  Serum creatinine 1.12, BUN 18, Potassium 4.1, Sodium 139, BNP 872.5  Vital Signs: . Weight: 274 lbs (admission weight: 280 lbs) . Blood pressure: 110/90s  . Heart rate: 90-110s   Medication Assistance / Insurance Benefits Check: Does the patient have prescription insurance?  Yes Type of insurance plan: [place]:  Prior to admission outpatient pharmacy[facility] Is the patient willing to use [facility] at discharge? Yes  Assessment: 1. Acute on chronic systolic CHF (EF 34%), due to NICM. NYHA class II symptoms. - Continue furosemide 40 mg IV BID - Continue metoprolol tartrate 37.5 mg q8h. Recommend to convert to metoprolol XL prior to discharge once optimal dose achieved. - Continue Entresto 49/51 mg BID - Consider adding spironolactone and SGLT2i prior to discharge   Plan: 1) Medication changes recommended at this time: - Add spironolactone 12.5 mg daily  2) Patient assistance: - None pending  3)  Education  - To be completed prior to discharge  Kerby Nora, PharmD, BCPS Heart Failure Stewardship Pharmacist Phone [phone]

## 2020-09-25 NOTE — H&P (View-Only) (Signed)
Progress Note  Patient Name: Suzanne Long Date of Encounter: 09/25/2020  [facility] Cardiologist: Sinclair Grooms, MD   Subjective   Feeling better.  Breathing is improving.   Inpatient Medications    Scheduled Meds: . furosemide  40 mg Intravenous BID  . metoprolol tartrate  37.5 mg Oral Q8H  . sacubitril-valsartan  1 tablet Oral BID  . sodium chloride flush  3 mL Intravenous Q12H  . sodium chloride flush  3 mL Intravenous Q12H   Continuous Infusions: . sodium chloride    . heparin 1,600 Units/hr (09/25/20 0631)   PRN Meds: sodium chloride, acetaminophen, morphine injection, ondansetron (ZOFRAN) IV, sodium chloride flush   Vital Signs    Vitals:   09/24/20 2202 09/25/20 0333 09/25/20 0625 09/25/20 0836  BP: 98/78 94/82 (!) 116/93 115/89  Pulse:  92 92 92  Resp:  16  16  Temp:  97.9 F (36.6 C)  98.3 F (36.8 C)  TempSrc:  Oral  Oral  SpO2:  99%  99%  Weight:  124.6 kg    Height:        Intake/Output Summary (Last 24 hours) at 09/25/2020 1112 Last data filed at 09/25/2020 0800 Gross per 24 hour  Intake 1789 ml  Output 400 ml  Net 1389 ml   Last 3 Weights 09/25/2020 09/24/2020 09/23/2020  Weight (lbs) 274 lb 12.8 oz 275 lb 12.8 oz 279 lb  Weight (kg) 124.648 kg 125.102 kg 126.554 kg      Telemetry    Atrial flutter.  Rate 100s.  PVCs. - Personally Reviewed  ECG    Atrial flutter.  Rate 117 bpm.  PVC.  LAFB - Personally Reviewed  Physical Exam   VS:  BP 115/89   Pulse 92   Temp 98.3 F (36.8 C) (Oral)   Resp 16   Ht $R'5\' 9"'dE$  (1.753 m)   Wt 124.6 kg   SpO2 99%   BMI 40.58 kg/m  , BMI Body mass index is 40.58 kg/m. GENERAL:  Well appearing.  No acute distress HEENT: Pupils equal round and reactive, fundi not visualized, oral mucosa unremarkable NECK:  No jugular venous distention, waveform within normal limits, carotid upstroke brisk and symmetric, no bruits, no thyromegaly LYMPHATICS:  No cervical adenopathy LUNGS:  Clear to auscultation  bilaterally HEART:  Irregularly irregular.  PMI not displaced or sustained,S1 and S2 within normal limits, no S3, no S4, no clicks, no rubs, no murmurs ABD:  Flat, positive bowel sounds normal in frequency in pitch, no bruits, no rebound, no guarding, no midline pulsatile mass, no hepatomegaly, no splenomegaly EXT:  2 plus pulses throughout, no edema, no cyanosis no clubbing SKIN:  No rashes no nodules NEURO:  Cranial nerves II through XII grossly intact, motor grossly intact throughout PSYCH:  Cognitively intact, oriented to person place and time  Labs    High Sensitivity Troponin:   Recent Labs  Lab 09/22/20 0755 09/22/20 1026  TROPONINIHS 1,705* 4,469*      Chemistry Recent Labs  Lab 09/22/20 0755 09/23/20 0433 09/25/20 0328  NA 139 138 139  K 3.9 3.8 4.1  CL 103 103 99  CO2 $Re'27 26 31  'oMG$ GLUCOSE 137* 114* 137*  BUN $Re'12 8 18  'KRp$ CREATININE 1.09* 0.97 1.12*  CALCIUM 9.3 8.9 9.5  GFRNONAA >60 >60 >60  ANIONGAP $RemoveB'9 9 9     'qJiZoOYs$ Hematology Recent Labs  Lab 09/23/20 0433 09/24/20 0312 09/25/20 0328  WBC 5.5 6.7 8.1  RBC 4.77 4.84 5.56*  HGB 14.0 14.5 16.3*  HCT 43.3 43.6 50.2*  MCV 90.8 90.1 90.3  MCH 29.4 30.0 29.3  MCHC 32.3 33.3 32.5  RDW 13.7 13.3 13.2  PLT 258 239 304    BNP Recent Labs  Lab 09/22/20 0755  BNP 872.5*     DDimer No results for input(s): DDIMER in the last 168 hours.   Radiology    CARDIAC CATHETERIZATION  Result Date: 09/24/2020  There is severe left ventricular systolic dysfunction.  LV end diastolic pressure is mildly elevated.  The left ventricular ejection fraction is less than 25% by visual estimate.  Suzanne Long is a 50 y.o. female  [id number] LOCATION:  FACILITY: [facility] PHYSICIAN: Suzanne Long, M.D. [birth year] DATE OF PROCEDURE:  09/24/2020 DATE OF DISCHARGE: CARDIAC CATHETERIZATION History obtained from chart review.Suzanne Tatumis a 50 y.o.femalewith a hx of non-ishemic cardiomyopathy, hypertension and hypothyroidismhere with new  onset atrial flutter and elevated troponin.   [name] has normal coronary arteries and severe LV dysfunction consistent with a nonischemic cardiomyopathy.  Guideline directed optimal medical therapy will be recommended.  TEE guided DC cardioversion is anticipated prior to discharge.  Sheath was removed and a TR band was placed on the right wrist to achieve patent hemostasis.  The patient left lab in stable condition.  IV heparin will be restarted 4 hours after sheath removal without a bolus. Suzanne Long. MD, [facility] 09/24/2020 1:58 PM    Cardiac Studies   Echo 03/04/19: 1. The left ventricle has severely reduced systolic function, with an  ejection fraction of 25-30%. The cavity size was severely dilated. Left  ventricular diastolic parameters were normal. Left ventrical global  hypokinesis without regional wall motion  abnormalities.  2. The right ventricle has normal systolic function. The cavity was  normal. There is no increase in right ventricular wall thickness.  3. Left atrial size was severely dilated.  4. The aortic valve is tricuspid. Moderate thickening of the aortic  valve. Mild calcification of the aortic valve.  5. The aorta is normal unless otherwise noted.   Patient Profile     Suzanne Tatumis a 50 y.o.femalewith a hx of non-ishemic cardiomyopathy, hypertension and hypothyroidismhere with new onset atrial flutter and elevated troponin.   Assessment & Plan    # New onset atrial flutter: Heart rate are better controlled on metoprolol 37.5 mg q8h, I will switch to Toprol XL 75 mg po daily.  We will will continue IV heparin.  Plan for TEE/cardioversion tomorrow. TSH is normal.   # NSTEMI: Troponin 1705->4469, values higher than we would expect just with demand ischemia, cath yesterday showed no CAD. This family history of premature coronary artery disease in her uncle and grandfather who both had heart attacks in their early 68s,  # Hyperlipidemia:  LDL mildly  elevated at 109. No CAD on cath.  #Acute on chronic systolic and diastolic heart failure: # Hypertension:  LVEF most recently 25 to 30%.  Interestingly her brother and 2 of her children died of Duchenne myopathy. BNP is elevated.  She was started with gentle diuresis and is net -1.1 L yesteday.  Renal function stable, creatinine 1.09.  Continue furosemide.  Needs consideration for ICD.  Switched to metoprolol instead of carvedilol for rate control.  Continue Entresto.  Start Jardiance, possibly spironolactone at discharge if BP allows.  For questions or updates, please contact [place] Please consult [email or web address] for contact info under     Signed, Ena Dawley, MD  09/25/2020, 11:12 AM

## 2020-09-25 NOTE — Progress Notes (Signed)
Progress Note  Patient Name: Suzanne Long Date of Encounter: 09/25/2020  [facility] Cardiologist: Sinclair Grooms, MD   Subjective   Feeling better.  Breathing is improving.   Inpatient Medications    Scheduled Meds: . furosemide  40 mg Intravenous BID  . metoprolol tartrate  37.5 mg Oral Q8H  . sacubitril-valsartan  1 tablet Oral BID  . sodium chloride flush  3 mL Intravenous Q12H  . sodium chloride flush  3 mL Intravenous Q12H   Continuous Infusions: . sodium chloride    . heparin 1,600 Units/hr (09/25/20 0631)   PRN Meds: sodium chloride, acetaminophen, morphine injection, ondansetron (ZOFRAN) IV, sodium chloride flush   Vital Signs    Vitals:   09/24/20 2202 09/25/20 0333 09/25/20 0625 09/25/20 0836  BP: 98/78 94/82 (!) 116/93 115/89  Pulse:  92 92 92  Resp:  16  16  Temp:  97.9 F (36.6 C)  98.3 F (36.8 C)  TempSrc:  Oral  Oral  SpO2:  99%  99%  Weight:  124.6 kg    Height:        Intake/Output Summary (Last 24 hours) at 09/25/2020 1112 Last data filed at 09/25/2020 0800 Gross per 24 hour  Intake 1789 ml  Output 400 ml  Net 1389 ml   Last 3 Weights 09/25/2020 09/24/2020 09/23/2020  Weight (lbs) 274 lb 12.8 oz 275 lb 12.8 oz 279 lb  Weight (kg) 124.648 kg 125.102 kg 126.554 kg      Telemetry    Atrial flutter.  Rate 100s.  PVCs. - Personally Reviewed  ECG    Atrial flutter.  Rate 117 bpm.  PVC.  LAFB - Personally Reviewed  Physical Exam   VS:  BP 115/89   Pulse 92   Temp 98.3 F (36.8 C) (Oral)   Resp 16   Ht $R'5\' 9"'xG$  (1.753 m)   Wt 124.6 kg   SpO2 99%   BMI 40.58 kg/m  , BMI Body mass index is 40.58 kg/m. GENERAL:  Well appearing.  No acute distress HEENT: Pupils equal round and reactive, fundi not visualized, oral mucosa unremarkable NECK:  No jugular venous distention, waveform within normal limits, carotid upstroke brisk and symmetric, no bruits, no thyromegaly LYMPHATICS:  No cervical adenopathy LUNGS:  Clear to auscultation  bilaterally HEART:  Irregularly irregular.  PMI not displaced or sustained,S1 and S2 within normal limits, no S3, no S4, no clicks, no rubs, no murmurs ABD:  Flat, positive bowel sounds normal in frequency in pitch, no bruits, no rebound, no guarding, no midline pulsatile mass, no hepatomegaly, no splenomegaly EXT:  2 plus pulses throughout, no edema, no cyanosis no clubbing SKIN:  No rashes no nodules NEURO:  Cranial nerves II through XII grossly intact, motor grossly intact throughout PSYCH:  Cognitively intact, oriented to person place and time  Labs    High Sensitivity Troponin:   Recent Labs  Lab 09/22/20 0755 09/22/20 1026  TROPONINIHS 1,705* 4,469*      Chemistry Recent Labs  Lab 09/22/20 0755 09/23/20 0433 09/25/20 0328  NA 139 138 139  K 3.9 3.8 4.1  CL 103 103 99  CO2 $Re'27 26 31  'iXJ$ GLUCOSE 137* 114* 137*  BUN $Re'12 8 18  'QNV$ CREATININE 1.09* 0.97 1.12*  CALCIUM 9.3 8.9 9.5  GFRNONAA >60 >60 >60  ANIONGAP $RemoveB'9 9 9     'mLhFhyZp$ Hematology Recent Labs  Lab 09/23/20 0433 09/24/20 0312 09/25/20 0328  WBC 5.5 6.7 8.1  RBC 4.77 4.84 5.56*  HGB 14.0 14.5 16.3*  HCT 43.3 43.6 50.2*  MCV 90.8 90.1 90.3  MCH 29.4 30.0 29.3  MCHC 32.3 33.3 32.5  RDW 13.7 13.3 13.2  PLT 258 239 304    BNP Recent Labs  Lab 09/22/20 0755  BNP 872.5*     DDimer No results for input(s): DDIMER in the last 168 hours.   Radiology    CARDIAC CATHETERIZATION  Result Date: 09/24/2020  There is severe left ventricular systolic dysfunction.  LV end diastolic pressure is mildly elevated.  The left ventricular ejection fraction is less than 25% by visual estimate.  Suzanne Long is a 50 y.o. female  [id number] LOCATION:  FACILITY: [facility] PHYSICIAN: [name], M.D. 11/03/1970 DATE OF PROCEDURE:  09/24/2020 DATE OF DISCHARGE: CARDIAC CATHETERIZATION History obtained from chart review.Suzanne Tatumis a 50 y.o.femalewith a hx of non-ishemic cardiomyopathy, hypertension and hypothyroidismhere with new  onset atrial flutter and elevated troponin.   Suzanne Long has normal coronary arteries and severe LV dysfunction consistent with a nonischemic cardiomyopathy.  Guideline directed optimal medical therapy will be recommended.  TEE guided DC cardioversion is anticipated prior to discharge.  Sheath was removed and a TR band was placed on the right wrist to achieve patent hemostasis.  The patient left lab in stable condition.  IV heparin will be restarted 4 hours after sheath removal without a bolus. [name]. MD, [facility] 09/24/2020 1:58 PM    Cardiac Studies   Echo 03/04/19: 1. The left ventricle has severely reduced systolic function, with an  ejection fraction of 25-30%. The cavity size was severely dilated. Left  ventricular diastolic parameters were normal. Left ventrical global  hypokinesis without regional wall motion  abnormalities.  2. The right ventricle has normal systolic function. The cavity was  normal. There is no increase in right ventricular wall thickness.  3. Left atrial size was severely dilated.  4. The aortic valve is tricuspid. Moderate thickening of the aortic  valve. Mild calcification of the aortic valve.  5. The aorta is normal unless otherwise noted.   Patient Profile     Suzanne Tatumis a 50 y.o.femalewith a hx of non-ishemic cardiomyopathy, hypertension and hypothyroidismhere with new onset atrial flutter and elevated troponin.   Assessment & Plan    # New onset atrial flutter: Heart rate are better controlled on metoprolol 37.5 mg q8h, I will switch to Toprol XL 75 mg po daily.  We will will continue IV heparin.  Plan for TEE/cardioversion tomorrow. TSH is normal.   # NSTEMI: Troponin 1705->4469, values higher than we would expect just with demand ischemia, cath yesterday showed no CAD. This family history of premature coronary artery disease in her uncle and grandfather who both had heart attacks in their early 50s,  # Hyperlipidemia:  LDL mildly  elevated at 109. No CAD on cath.  #Acute on chronic systolic and diastolic heart failure: # Hypertension:  LVEF most recently 25 to 30%.  Interestingly her brother and 2 of her children died of Duchenne myopathy. BNP is elevated.  She was started with gentle diuresis and is net -1.1 L yesteday.  Renal function stable, creatinine 1.09.  Continue furosemide.  Needs consideration for ICD.  Switched to metoprolol instead of carvedilol for rate control.  Continue Entresto.  Start Jardiance, possibly spironolactone at discharge if BP allows.  For questions or updates, please contact [place] Please consult [email or web address] for contact info under     Signed, Ena Dawley, MD  09/25/2020, 11:12 AM

## 2020-09-25 NOTE — Progress Notes (Signed)
ANTICOAGULATION CONSULT NOTE - Follow Up Consult  Pharmacy Consult for Heparin Indication: chest pain/ACS, atrial flutter  Allergies  Allergen Reactions  . Shellfish Allergy Anaphylaxis    Uncoded Allergy. Allergen: SHELLFISH  . Gadolinium Derivatives Nausea And Vomiting    Vomiting after 20cc multihance injection  . Iodine Itching  . Latex Rash    Patient Measurements: Height: 5\' 9"  (175.3 cm) Weight: 124.6 kg (274 lb 12.8 oz) IBW/kg (Calculated) : 66.2 Heparin Dosing Weight: 97 kg  Vital Signs: Temp: 97.9 F (36.6 C) (03/15 0333) Temp Source: Oral (03/15 0333) BP: 94/82 (03/15 0333) Pulse Rate: 92 (03/15 0333)  Labs: Recent Labs    09/22/20 0755 09/22/20 1026 09/22/20 1832 09/23/20 0433 09/23/20 1122 09/24/20 0312 09/25/20 0328  HGB 14.8  --   --  14.0  --  14.5 16.3*  HCT 46.6*  --   --  43.3  --  43.6 50.2*  PLT 299  --   --  258  --  239 304  HEPARINUNFRC  --   --    < > 0.35 0.30 0.37 0.36  CREATININE 1.09*  --   --  0.97  --   --  1.12*  TROPONINIHS 1,705* 4,469*  --   --   --   --   --    < > = values in this interval not displayed.    Estimated Creatinine Clearance: 86.9 mL/min (A) (by C-G formula based on SCr of 1.12 mg/dL (H)).  Assessment: Patient is a 9 yof that presented to the ED with chest pain and shortness of breath. Patient was found to be in aflutter. Patient was also found to have an elevated trop level > 1000. Pharmacy has been asked to dose heparin  for ACS.    Heparin level therapeutic (0.37) on 1600 units/hr. CBC stable.  For cardiac cath and TEE/cardioversion.  3/15 AM update:  Heparin level remains therapeutic after re-start s/p cath  Goal of Therapy:  Heparin level 0.3-0.7 units/ml Monitor platelets by anticoagulation protocol: Yes   Plan:   Continue heparin drip at 1600 units/hr  Daily heparin level and CBC  Narda Bonds, PharmD, BCPS Clinical Pharmacist Phone: [phone]

## 2020-09-26 ENCOUNTER — Inpatient Hospital Stay (HOSPITAL_COMMUNITY): Admitting: Anesthesiology

## 2020-09-26 ENCOUNTER — Inpatient Hospital Stay (HOSPITAL_COMMUNITY)

## 2020-09-26 ENCOUNTER — Encounter (HOSPITAL_COMMUNITY)
Admission: EM | Disposition: A | Discharge status: Home / Self Care | Payer: Self-pay | Source: Home / Self Care | Attending: Cardiovascular Disease

## 2020-09-26 ENCOUNTER — Other Ambulatory Visit: Payer: Self-pay

## 2020-09-26 ENCOUNTER — Encounter (HOSPITAL_COMMUNITY): Payer: Self-pay | Admitting: Cardiovascular Disease

## 2020-09-26 DIAGNOSIS — I428 Other cardiomyopathies: Secondary | ICD-10-CM

## 2020-09-26 DIAGNOSIS — I4892 Unspecified atrial flutter: Secondary | ICD-10-CM | POA: Diagnosis not present

## 2020-09-26 DIAGNOSIS — I5043 Acute on chronic combined systolic (congestive) and diastolic (congestive) heart failure: Secondary | ICD-10-CM | POA: Diagnosis not present

## 2020-09-26 DIAGNOSIS — I483 Typical atrial flutter: Secondary | ICD-10-CM | POA: Diagnosis not present

## 2020-09-26 DIAGNOSIS — I4891 Unspecified atrial fibrillation: Secondary | ICD-10-CM | POA: Diagnosis not present

## 2020-09-26 DIAGNOSIS — I214 Non-ST elevation (NSTEMI) myocardial infarction: Secondary | ICD-10-CM | POA: Diagnosis present

## 2020-09-26 DIAGNOSIS — Z7901 Long term (current) use of anticoagulants: Secondary | ICD-10-CM

## 2020-09-26 DIAGNOSIS — I340 Nonrheumatic mitral (valve) insufficiency: Secondary | ICD-10-CM

## 2020-09-26 HISTORY — PX: TEE WITHOUT CARDIOVERSION: SHX5443

## 2020-09-26 HISTORY — PX: CARDIOVERSION: SHX1299

## 2020-09-26 HISTORY — DX: Other cardiomyopathies: I42.8

## 2020-09-26 HISTORY — DX: Non-ST elevation (NSTEMI) myocardial infarction: I21.4

## 2020-09-26 HISTORY — PX: BUBBLE STUDY: SHX6837

## 2020-09-26 LAB — CBC
HCT: 48.5 % — ABNORMAL HIGH (ref 36.0–46.0)
Hemoglobin: 16.3 g/dL — ABNORMAL HIGH (ref 12.0–15.0)
MCH: 30.2 pg (ref 26.0–34.0)
MCHC: 33.6 g/dL (ref 30.0–36.0)
MCV: 89.8 fL (ref 80.0–100.0)
Platelets: 285 10*3/uL (ref 150–400)
RBC: 5.4 MIL/uL — ABNORMAL HIGH (ref 3.87–5.11)
RDW: 13.9 % (ref 11.5–15.5)
WBC: 11.4 10*3/uL — ABNORMAL HIGH (ref 4.0–10.5)
nRBC: 0 % (ref 0.0–0.2)

## 2020-09-26 LAB — HEPARIN LEVEL (UNFRACTIONATED): Heparin Unfractionated: 0.39 IU/mL (ref 0.30–0.70)

## 2020-09-26 SURGERY — ECHOCARDIOGRAM, TRANSESOPHAGEAL
Anesthesia: Monitor Anesthesia Care

## 2020-09-26 MED ORDER — FUROSEMIDE 40 MG PO TABS
40.0000 mg | ORAL_TABLET | Freq: Every day | ORAL | Status: DC
  Administered 2020-09-26: 40 mg via ORAL
  Filled 2020-09-26: qty 1

## 2020-09-26 MED ORDER — LIDOCAINE 2% (20 MG/ML) 5 ML SYRINGE
INTRAMUSCULAR | Status: DC | PRN
  Administered 2020-09-26: 80 mg via INTRAVENOUS

## 2020-09-26 MED ORDER — PROPOFOL 500 MG/50ML IV EMUL
INTRAVENOUS | Status: DC | PRN
  Administered 2020-09-26: 100 ug/kg/min via INTRAVENOUS

## 2020-09-26 MED ORDER — PHENOL 1.4 % MT LIQD: 1.0000 | OROMUCOSAL | 0 refills | Status: DC | PRN

## 2020-09-26 MED ORDER — APIXABAN 5 MG PO TABS
5.0000 mg | ORAL_TABLET | Freq: Two times a day (BID) | ORAL | Status: DC
  Administered 2020-09-26: 5 mg via ORAL
  Filled 2020-09-26: qty 1

## 2020-09-26 MED ORDER — EMPAGLIFLOZIN 10 MG PO TABS: 10.0000 mg | ORAL_TABLET | Freq: Every day | ORAL | 6 refills | Status: DC

## 2020-09-26 MED ORDER — PHENYLEPHRINE 40 MCG/ML (10ML) SYRINGE FOR IV PUSH (FOR BLOOD PRESSURE SUPPORT)
PREFILLED_SYRINGE | INTRAVENOUS | Status: DC | PRN
  Administered 2020-09-26 (×2): 120 ug via INTRAVENOUS

## 2020-09-26 MED ORDER — PROPOFOL 10 MG/ML IV BOLUS
INTRAVENOUS | Status: DC | PRN
  Administered 2020-09-26: 50 mg via INTRAVENOUS

## 2020-09-26 MED ORDER — ACETAMINOPHEN 325 MG PO TABS: 650.0000 mg | ORAL_TABLET | ORAL | Status: DC | PRN

## 2020-09-26 MED ORDER — PHENOL 1.4 % MT LIQD: 1.0000 | OROMUCOSAL | Status: DC | PRN

## 2020-09-26 MED ORDER — METOPROLOL TARTRATE 25 MG PO TABS: 37.5000 mg | ORAL_TABLET | Freq: Three times a day (TID) | ORAL | 6 refills | Status: DC

## 2020-09-26 MED ORDER — BUTAMBEN-TETRACAINE-BENZOCAINE 2-2-14 % EX AERO
INHALATION_SPRAY | CUTANEOUS | Status: DC | PRN
  Administered 2020-09-26: 1 via TOPICAL

## 2020-09-26 MED ORDER — APIXABAN 5 MG PO TABS: 5.0000 mg | ORAL_TABLET | Freq: Two times a day (BID) | ORAL | 6 refills | Status: DC

## 2020-09-26 NOTE — TOC Benefit Eligibility Note (Signed)
Transition of Care [facility]) Benefit Eligibility Note    Patient Details  Name: Suzanne Long MRN: [id number] Date of Birth: 13-Oct-1970   Medication/Dose: Eliquis  Covered?: Yes     Prescription Coverage [facility]erred [facility]acy: [facility]  Spoke with Person/Company/Phone Number:: [employer]/ [employer]  Co-Pay: $38 for 30 day retail/ $34 for 90 day mail order  Prior Approval: No     Additional Notes: patient can fill retail x2, then must switch to mail order    [employer] Phone Number: 09/26/2020, 1:23 PM

## 2020-09-26 NOTE — Anesthesia Procedure Notes (Signed)
Procedure Name: MAC Date/Time: 09/26/2020 9:10 AM Performed by: Renato Shin, CRNA Pre-anesthesia Checklist: Patient identified, Emergency Drugs available, Suction available and Patient being monitored Patient Re-evaluated:Patient Re-evaluated prior to induction Oxygen Delivery Method: Nasal cannula Preoxygenation: Pre-oxygenation with 100% oxygen Induction Type: IV induction Tube type: Oral Number of attempts: 1 Airway Equipment and Method: Bite block Placement Confirmation: positive ETCO2 and breath sounds checked- equal and bilateral Dental Injury: Teeth and Oropharynx as per pre-operative assessment

## 2020-09-26 NOTE — Progress Notes (Signed)
  Echocardiogram Echocardiogram Transesophageal has been performed.  Suzanne Long 09/26/2020, 9:59 AM

## 2020-09-26 NOTE — Plan of Care (Signed)
  Problem: Education: Goal: Knowledge of General Education information will improve Description: Including pain rating scale, medication(s)/side effects and non-pharmacologic comfort measures Outcome: Adequate for Discharge   

## 2020-09-26 NOTE — Discharge Instructions (Addendum)
Heart Healthy low salt diet.  Weigh daily and keep a log to bring to appointments. If weight increases by 3 pounds in a day or 5 pounds in a week call Dr. Thompson Caul office We adjusted your meds, read instructions carefully.       Post Cardiac Catheterization: NO HEAVY LIFTING OR SEXUAL ACTIVITY X 7 DAYS. NO DRIVING X 2-3 DAYS. NO SOAKING BATHS, HOT TUBS, POOLS, ETC., X 7 DAYS.  Radial Site Care: Refer to this sheet in the next few weeks. These instructions provide you with information on caring for yourself after your procedure. Your caregiver may also give you more specific instructions. Your treatment has been planned according to current medical practices, but problems sometimes occur. Call your caregiver if you have any problems or questions after your procedure. HOME CARE INSTRUCTIONS  You may shower the day after the procedure.Remove the bandage (dressing) and gently wash the site with plain soap and water.Gently pat the site dry.   Do not apply powder or lotion to the site.   Do not submerge the affected site in water for 3 to 5 days.   Inspect the site at least twice daily.   Do not flex or bend the affected arm for 24 hours.   No lifting over 5 pounds (2.3 kg) for 5 days after your procedure.   Do not drive home if you are discharged the same day of the procedure. Have someone else drive you.  What to expect:  Any bruising will usually fade within 1 to 2 weeks.   Blood that collects in the tissue (hematoma) may be painful to the touch. It should usually decrease in size and tenderness within 1 to 2 weeks.  SEEK IMMEDIATE MEDICAL CARE IF:  You have unusual pain at the radial site.   You have redness, warmth, swelling, or pain at the radial site.   You have drainage (other than a small amount of blood on the dressing).   You have chills.   You have a fever or persistent symptoms for more than 72 hours.   You have a fever and your symptoms suddenly get worse.    Your arm becomes pale, cool, tingly, or numb.   You have heavy bleeding from the site. Hold pressure on the site.   ========================================================================================================  Information on my medicine - ELIQUIS (apixaban)  This medication education was reviewed with me or my healthcare representative as part of my discharge preparation.    Why was Eliquis prescribed for you? Eliquis was prescribed for you to reduce the risk of a blood clot forming that can cause a stroke if you have a medical condition called atrial fibrillation (a type of irregular heartbeat).  What do You need to know about Eliquis ? Take your Eliquis TWICE DAILY - one tablet in the morning and one tablet in the evening with or without food. If you have difficulty swallowing the tablet whole please discuss with your pharmacist how to take the medication safely.  Take Eliquis exactly as prescribed by your doctor and DO NOT stop taking Eliquis without talking to the doctor who prescribed the medication.  Stopping may increase your risk of developing a stroke.  Refill your prescription before you run out.  After discharge, you should have regular check-up appointments with your healthcare provider that is prescribing your Eliquis.  In the future your dose may need to be changed if your kidney function or weight changes by a significant amount or as you get older.  What do you do if you miss a dose? If you miss a dose, take it as soon as you remember on the same day and resume taking twice daily.  Do not take more than one dose of ELIQUIS at the same time to make up a missed dose.  Important Safety Information A possible side effect of Eliquis is bleeding. You should call your healthcare provider right away if you experience any of the following: ? Bleeding from an injury or your nose that does not stop. ? Unusual colored urine (red or dark brown) or unusual colored  stools (red or black). ? Unusual bruising for unknown reasons. ? A serious fall or if you hit your head (even if there is no bleeding).  Some medicines may interact with Eliquis and might increase your risk of bleeding or clotting while on Eliquis. To help avoid this, consult your healthcare provider or pharmacist prior to using any new prescription or non-prescription medications, including herbals, vitamins, non-steroidal anti-inflammatory drugs (NSAIDs) and supplements.  This website has more information on Eliquis (apixaban): http://www.eliquis.com/eliquis/home

## 2020-09-26 NOTE — Transfer of Care (Signed)
Immediate Anesthesia Transfer of Care Note  Patient: Suzanne Long  Procedure(s) Performed: TRANSESOPHAGEAL ECHOCARDIOGRAM (TEE) (N/A ) CARDIOVERSION (N/A ) BUBBLE STUDY  Patient Location: PACU and Endoscopy Unit  Anesthesia Type:MAC  Level of Consciousness: drowsy and patient cooperative  Airway & Oxygen Therapy: Patient Spontanous Breathing and Patient connected to nasal cannula oxygen  Post-op Assessment: Report given to RN and Post -op Vital signs reviewed and stable  Post vital signs: Reviewed and stable  Last Vitals:  Vitals Value Taken Time  BP 102/83 09/26/20 0938  Temp 36.8 C 09/26/20 0935  Pulse 82 09/26/20 0939  Resp 16 09/26/20 0939  SpO2 96 % 09/26/20 0939  Vitals shown include unvalidated device data.  Last Pain:  Vitals:   09/26/20 0935  TempSrc: Axillary  PainSc: Asleep         Complications: No complications documented.

## 2020-09-26 NOTE — Progress Notes (Signed)
Heart Failure Stewardship Pharmacist Progress Note   PCP: Lennie Odor, PA PCP-Cardiologist: Sinclair Grooms, MD    HPI:  50 yo F with PMH of NICM, HTN, and hypothyroidism. She presented to the ED on 09/22/20 with chest pain and shortness of breath. She was found to be in new onset atrial flutter and acute on chronic heart failure. Her last ECHO was done on 03/04/2019 and LVEF was 25-30% (down from 40% in 2017). LHC was done on 09/24/20 and found to have normal coronaries and estimated LVEF of <25%.  Discharge HF Medications: Furosemide 40 mg daily Metoprolol tartrate 37.5 mg q8h Entresto 49/51 mg BID Jardiance 10 mg daily  Prior to admission HF Medications: Furosemide 40 mg daily (patient states she only takes as needed) Carvedilol 25 mg BID Entresto 24/26 mg BID  Pertinent Lab Values:  Serum creatinine 1.12, BUN 18, Potassium 4.1, Sodium 139, BNP 872.5  Vital Signs: . Weight: 274 lbs (admission weight: 280 lbs) . Blood pressure: 110/90s  . Heart rate: 90-110s   Medication Assistance / Insurance Benefits Check: Does the patient have prescription insurance?  Yes Type of insurance plan: [place]:  Prior to admission outpatient pharmacy: [facility] Is the patient willing to use [facility] at discharge? Yes  Assessment: 1. Acute on chronic systolic CHF (EF 62%), due to NICM. NYHA class II symptoms. - Continue furosemide 40 mg daily - Continue metoprolol tartrate 37.5 mg q8h. Recommend to convert to metoprolol XL once optimal dose achieved. - Continue Entresto 49/51 mg BID - Consider adding spironolactone at follow up - Continue Jardiance 10 mg daily   Plan: 1) Medication changes recommended at this time: - Discharge today  2) Patient assistance: - None pending  3)  Education  - Patient has been educated on current HF medications and potential additions to HF medication regimen  - Patient verbalizes understanding that over the next few  months, these medication doses may change and more medications may be added to optimize HF regimen - Patient has been educated on basic disease state pathophysiology and goals of therapy   Kerby Nora, PharmD, BCPS Heart Failure Stewardship Pharmacist Phone [phone]

## 2020-09-26 NOTE — Procedures (Signed)
     Transesophageal Echocardiogram Note  Suzanne Long [id number] 14-Jun-1971  Procedure: Transesophageal Echocardiogram Indications: Atrial fibrillation  Procedure Details Consent: Obtained Time Out: Verified patient identification, verified procedure, site/side was marked, verified correct patient position, special equipment/implants available, Radiology Safety Procedures followed,  medications/allergies/relevent history reviewed, required imaging and test results available.  Performed  Medications: Lidocaine: 80mg  Propofol: 170mg   Left Ventrical:  Severely reduced LVEF 25-30%  Mitral Valve: Mildly thickened, mild mitral regurgitation  Aortic Valve: No AI  Tricuspid Valve: Normal, trivial TR  Pulmonic Valve: Normal, No PI  Left Atrium/ Left atrial appendage: Smoke in the LAA; no evidence of LAA thrombus  Atrial septum: No PFO visualized by color doppler  Aorta: Mild plaquing   Complications: No apparent complications Patient did tolerate procedure well.  Freada Bergeron, MD 09/26/2020, 9:28 AM

## 2020-09-26 NOTE — Interval H&P Note (Signed)
History and Physical Interval Note:  0/03/7948 9:71 AM  Suzanne Long  has presented today for surgery, with the diagnosis of afib.  The various methods of treatment have been discussed with the patient and family. After consideration of risks, benefits and other options for treatment, the patient has consented to  Procedure(s): TRANSESOPHAGEAL ECHOCARDIOGRAM (TEE) (N/A) CARDIOVERSION (N/A) as a surgical intervention.  The patient's history has been reviewed, patient examined, no change in status, stable for surgery.  I have reviewed the patient's chart and labs.  Questions were answered to the patient's satisfaction.     Freada Bergeron

## 2020-09-26 NOTE — Discharge Summary (Signed)
Discharge Summary    Patient ID: Suzanne Long MRN: [id number]; DOB: 02-22-1971  Admit date: 09/22/2020 Discharge date: 09/26/2020  PCP:  Lennie Odor, Hand  Cardiologist:  Sinclair Grooms, MD  Advanced Practice Provider:  No care team member to display Electrophysiologist:  None        Discharge Diagnoses    Principal Problem:   Acute on chronic systolic and diastolic heart failure, NYHA class 1 ([place]) Active Problems:   Atrial flutter ([place])   Non-ST elevation (NSTEMI) myocardial infarction [facility])   Essential hypertension   Other hyperlipidemia   Anticoagulation adequate   NICM (nonischemic cardiomyopathy) ([place])    Diagnostic Studies/Procedures    TEE 09/26/20 Medications: Lidocaine: 80mg  Propofol: 170mg   Left Ventrical:  Severely reduced LVEF 25-30%  Mitral Valve: Mildly thickened, mild mitral regurgitation  Aortic Valve: No AI  Tricuspid Valve: Normal, trivial TR  Pulmonic Valve: Normal, No PI  Left Atrium/ Left atrial appendage: Smoke in the LAA; no evidence of LAA thrombus  Atrial septum: No PFO visualized by color doppler  Aorta: Mild plaquing   Complications: No apparent complications Patient did tolerate procedure well.   DCCV 09/26/20   Procedure: Electrical Cardioversion Indications:  Atrial Fibrillation  Procedure Details:  Consent: Risks of procedure as well as the alternatives and risks of each were explained to the (patient/caregiver).  Consent for procedure obtained.  Time Out: Verified patient identification, verified procedure, site/side was marked, verified correct patient position, special equipment/implants available, medications/allergies/relevent history reviewed, required imaging and test results available. PERFORMED.  Patient placed on cardiac monitor, pulse oximetry, supplemental oxygen as necessary.  Sedation given: Lidocaine 80mg ; propofol 170mg  Pacer pads placed  anterior and posterior chest.  Cardioverted 1 time(s).  Cardioversion with synchronized biphasic 200J shock.  Evaluation: Findings: Post procedure EKG shows: NSR Complications: None Patient did tolerate procedure well.  Time Spent Directly with the Patient:  _____________  Cardiac cath 09/24/2020     There is severe left ventricular systolic dysfunction.  LV end diastolic pressure is mildly elevated.  The left ventricular ejection fraction is less than 25% by visual estimate.   normal coronary arteries       History of Present Illness     Suzanne Long is a 50 y.o. female with a hx of NICM, HTN, hypothyroid presented with atrial flutter, 09/22/20.  She developed palpitations the night piro to admit. Also with chest and back tightness and SOB.  HR was 120s with A flutter.  Troponin 1705 to 4469.  BNP 872. Placed on IV heparin and given IV metoprolol.     Prior echo with EF 25-30%, LHC in 2011 with no significant disease.   Hx COVID 19 07/2019.  She was admitted to control HR, anticoagulate and diuresis.     Hospital Course     Consultants: none   Pt had improved HR control on metoprolol.  Plans were made for TEE DCCV.  With elevated troponin and chest and back pain she had cardiac cath with normal coronary arteries.    Thyroid stable.   Today she had TEE DCCV that was successful.  She has been seen and evaluated by Dr. Meda Coffee and found stable for discharge.   She is negative 2837 cc since admit and wt down from 127 kg on admit to 124.2 kg.    She is now on eliquis. Her entresto continues and her coreg changed to metoprolol for HR control.  Started jardiance and lasix will  be 40 mg daily.    Consider spironolactone at next visit.    Interestingly her brother and 2 of her children died of Duchenne myopathy.   Thought to ICD in future.  Did the patient have an acute coronary syndrome (MI, NSTEMI, STEMI, etc) this admission?:  Yes                              BUT no  CAD  AHA/ACC Clinical Performance & Quality Measures: 1. Aspirin prescribed? - No - no CAD 2. ADP Receptor Inhibitor (Plavix/Clopidogrel, Brilinta/Ticagrelor or Effient/Prasugrel) prescribed (includes medically managed patients)? - No - no CAD 3. Beta Blocker prescribed? - Yes 4. High Intensity Statin (Lipitor 40-80mg  or Crestor 20-40mg ) prescribed? - No - no cad 5. EF assessed during THIS hospitalization? - Yes 6. For EF <40%, was ACEI/ARB prescribed? - yes 7. For EF <40%, Aldosterone Antagonist (Spironolactone or Eplerenone) prescribed? - No - Reason:  to be addressed as outpt 8. Cardiac Rehab Phase II ordered (including medically managed patients)? - No - no CAD       _____________  Discharge Vitals Blood pressure 106/89, pulse 71, temperature 98.2 F (36.8 C), temperature source Axillary, resp. rate 12, height 5\' 9"  (1.753 m), weight 124.2 kg, SpO2 95 %.  Filed Weights   09/24/20 0039 09/25/20 0333 09/26/20 0459  Weight: 125.1 kg 124.6 kg 124.2 kg    Labs & Radiologic Studies    CBC Recent Labs    09/25/20 0328 09/26/20 0420  WBC 8.1 11.4*  HGB 16.3* 16.3*  HCT 50.2* 48.5*  MCV 90.3 89.8  PLT 304 893   Basic Metabolic Panel Recent Labs    09/25/20 0328  NA 139  K 4.1  CL 99  CO2 31  GLUCOSE 137*  BUN 18  CREATININE 1.12*  CALCIUM 9.5   Liver Function Tests No results for input(s): AST, ALT, ALKPHOS, BILITOT, PROT, ALBUMIN in the last 72 hours. No results for input(s): LIPASE, AMYLASE in the last 72 hours. High Sensitivity Troponin:   Recent Labs  Lab 09/22/20 0755 09/22/20 1026  TROPONINIHS 1,705* 4,469*    BNP Invalid input(s): POCBNP D-Dimer No results for input(s): DDIMER in the last 72 hours. Hemoglobin A1C No results for input(s): HGBA1C in the last 72 hours. Fasting Lipid Panel No results for input(s): CHOL, HDL, LDLCALC, TRIG, CHOLHDL, LDLDIRECT in the last 72 hours. Thyroid Function Tests No results for input(s): TSH, T4TOTAL, T3FREE,  THYROIDAB in the last 72 hours.  Invalid input(s): FREET3 _____________  CARDIAC CATHETERIZATION  Result Date: 09/24/2020  There is severe left ventricular systolic dysfunction.  LV end diastolic pressure is mildly elevated.  The left ventricular ejection fraction is less than 25% by visual estimate.  Suzanne Long is a 50 y.o. female  [id number] LOCATION:  FACILITY: [facility] PHYSICIAN: [name], M.D. 04/03/1971 DATE OF PROCEDURE:  09/24/2020 DATE OF DISCHARGE: CARDIAC CATHETERIZATION History obtained from chart review.Loghan Tatumis a 50 y.o.femalewith a hx of non-ishemic cardiomyopathy, hypertension and hypothyroidismhere with new onset atrial flutter and elevated troponin.   Ms. Wilds has normal coronary arteries and severe LV dysfunction consistent with a nonischemic cardiomyopathy.  Guideline directed optimal medical therapy will be recommended.  TEE guided DC cardioversion is anticipated prior to discharge.  Sheath was removed and a TR band was placed on the right wrist to achieve patent hemostasis.  The patient left lab in stable condition.  IV heparin will be restarted 4 hours  after sheath removal without a bolus. Nanetta Batty. MD, [facility] 09/24/2020 1:58 PM   DG Chest Portable 1 View  Result Date: 09/22/2020 CLINICAL DATA:  Chest pain and shortness of breath EXAM: PORTABLE CHEST 1 VIEW COMPARISON:  May 29, 2015 FINDINGS: No edema or airspace opacity. There is cardiomegaly with pulmonary vascularity within normal limits. Mild prominence noted in the right hilar region. No pneumothorax. No bone lesions. IMPRESSION: Cardiomegaly. Mild prominence in the right hilar region may represent vascular prominence. As adenopathy potentially could present in this manner, would advise upright PA and lateral chest radiographs to further assess when patient is clinically able. Lungs clear. Electronically Signed   By: Bretta Bang III M.D.   On: 09/22/2020 08:57   Disposition   Pt is being  discharged home today in good condition.  Follow-up Plans & Appointments   Heart Healthy low salt diet.  Weigh daily and keep a log to bring to appointments. If weight increases by 3 pounds in a day or 5 pounds in a week call Dr. Michaelle Copas office We adjusted your meds, read instructions carefully.       Follow-up Information    Tereso Newcomer T, PA-C Follow up.   Specialties: Cardiology, Physician Assistant Why: Hospital follow-up with Cardiology scheduled for 10/17/2020 at 9:15am with Tereso Newcomer, one of the PAs with our office. Please arrive 15 minutes early for check-in. If this date/time does not work for you, please call our office to reschedule. Contact information: [address] [place] [address] [phone]        [facility]. Go on 09/28/2020.   Specialty: Cardiology Why: AT 9AM. [facility] ([facility] TOC clinic) within heart and vascular center FREE valet parking at [facility]. Bring all your medications with you.  Weigh yourself daily and keep a log. Contact information: [address] [id number] Wilhemina Bonito [place] [address] [phone]               Discharge Medications   Allergies as of 09/26/2020      Reactions   Shellfish Allergy Anaphylaxis   Uncoded Allergy. Allergen: SHELLFISH   Gadolinium Derivatives Nausea And Vomiting   Vomiting after 20cc multihance injection   Iodine Itching   Latex Rash      Medication List    STOP taking these medications   carvedilol 25 MG tablet Commonly known as: COREG   Vitamin D (Ergocalciferol) 1.25 MG (50000 UNIT) Caps capsule Commonly known as: DRISDOL     TAKE these medications   acetaminophen 325 MG tablet Commonly known as: TYLENOL Take 2 tablets (650 mg total) by mouth every 4 (four) hours as needed for headache or mild pain.   apixaban 5 MG Tabs tablet Commonly known as: ELIQUIS Take 1 tablet (5 mg total) by mouth 2 (two)  times daily.   empagliflozin 10 MG Tabs tablet Commonly known as: JARDIANCE Take 1 tablet (10 mg total) by mouth daily.   Entresto 49-51 MG Generic drug: sacubitril-valsartan Take 1 tablet by mouth 2 (two) times daily.   furosemide 40 MG tablet Commonly known as: LASIX Take 1 tablet (40 mg total) by mouth daily.   metoprolol tartrate 25 MG tablet Commonly known as: LOPRESSOR Take 1.5 tablets (37.5 mg total) by mouth every 8 (eight) hours.   phenol 1.4 % Liqd Commonly known as: CHLORASEPTIC Use as directed 1 spray in the mouth or throat as needed for throat irritation / pain.  polyvinyl alcohol 1.4 % ophthalmic solution Commonly known as: LIQUIFILM TEARS Place 1 drop into both eyes 3 (three) times daily.          Outstanding Labs/Studies   BMP and CBC  Duration of Discharge Encounter   Greater than 30 minutes including physician time.  Signed, Cecilie Kicks, NP 09/26/2020, 12:22 PM

## 2020-09-26 NOTE — TOC Transition Note (Signed)
Transition of Care [facility]) - CM/SW Discharge Note   Patient Details  Name: Suzanne Long MRN: [id number] Date of Birth: [birth year]  Transition of Care [facility]) CM/SW Contact:  Zenon Mayo, RN Phone Number: 09/26/2020, 1:10 PM   Clinical Narrative:    Patient is for dc today, TOC is filling medication for eliquis first 30 day free, benefit check is in progress for eliquis also to see what co pay is , patient's cell is [phone],  Alternate number is [phone].    Final next level of care: [place] Barriers to Discharge: No Barriers Identified   Patient Goals and CMS Choice Patient states their goals for this hospitalization and ongoing recovery are:: go home   Choice offered to / list presented to : NA  Discharge Placement                       Discharge Plan and Services                  DME Agency: NA       HH Arranged: NA          Social Determinants of Health (SDOH) Interventions Food Insecurity Interventions: Intervention Not Indicated Financial Strain Interventions: Intervention Not Indicated Housing Interventions: Intervention Not Indicated Physical Activity Interventions: Intervention Not Indicated Transportation Interventions: Intervention Not Indicated Alcohol Brief Interventions/Follow-up: AUDIT Score <7 follow-up not indicated   Readmission Risk Interventions No flowsheet data found.

## 2020-09-26 NOTE — Anesthesia Preprocedure Evaluation (Signed)
Anesthesia Evaluation  Patient identified by MRN, date of birth, ID band Patient awake    Reviewed: Allergy & Precautions, H&P , NPO status , Patient's Chart, lab work & pertinent test results  Airway Mallampati: II   Neck ROM: full    Dental   Pulmonary shortness of breath,    breath sounds clear to auscultation       Cardiovascular hypertension, +CHF  + dysrhythmias Atrial Fibrillation  Rhythm:irregular Rate:Normal  EF 30-35%   Neuro/Psych    GI/Hepatic   Endo/Other  Hypothyroidism Morbid obesity  Renal/GU      Musculoskeletal   Abdominal   Peds  Hematology   Anesthesia Other Findings   Reproductive/Obstetrics                             Anesthesia Physical Anesthesia Plan  ASA: III  Anesthesia Plan: MAC   Post-op Pain Management:    Induction: Intravenous  PONV Risk Score and Plan: 2 and Propofol infusion and Treatment may vary due to age or medical condition  Airway Management Planned: Nasal Cannula  Additional Equipment:   Intra-op Plan:   Post-operative Plan:   Informed Consent: I have reviewed the patients History and Physical, chart, labs and discussed the procedure including the risks, benefits and alternatives for the proposed anesthesia with the patient or authorized representative who has indicated his/her understanding and acceptance.     Dental advisory given  Plan Discussed with: CRNA and Anesthesiologist  Anesthesia Plan Comments:         Anesthesia Quick Evaluation

## 2020-09-26 NOTE — Progress Notes (Addendum)
ANTICOAGULATION CONSULT NOTE - Follow Up Consult  Pharmacy Consult for Heparin Indication: chest pain/ACS; atrial flutter  Allergies  Allergen Reactions  . Shellfish Allergy Anaphylaxis    Uncoded Allergy. Allergen: SHELLFISH  . Gadolinium Derivatives Nausea And Vomiting    Vomiting after 20cc multihance injection  . Iodine Itching  . Latex Rash    Patient Measurements: Height: 5\' 9"  (175.3 cm) Weight: 124.2 kg (273 lb 13 oz) IBW/kg (Calculated) : 66.2 Heparin Dosing Weight: 97 kg  Vital Signs: Temp: 98.2 F (36.8 C) (03/16 0750) Temp Source: Oral (03/16 0750) BP: 103/62 (03/16 0750) Pulse Rate: 99 (03/16 0750)  Labs: Recent Labs    09/24/20 0312 09/25/20 0328 09/26/20 0420  HGB 14.5 16.3* 16.3*  HCT 43.6 50.2* 48.5*  PLT 239 304 285  HEPARINUNFRC 0.37 0.36 0.39  CREATININE  --  1.12*  --     Estimated Creatinine Clearance: 86.7 mL/min (A) (by C-G formula based on SCr of 1.12 mg/dL (H)).  Assessment: Patient is a 28 yof that presented to the ED with chest pain and shortness of breath. Patient was found to be in aflutter. Patient was also found to have an elevated trop level > 1000. Pharmacy has been asked to dose heparin  for ACS.  S/p cardiac cath 3/14, no CAD.   Heparin level remains therapeutic (0.39) on 1600 units/hr. CBC stable.  For TEE/cardioversion today.  Goal of Therapy:  Heparin level 0.3-0.7 units/ml Monitor platelets by anticoagulation protocol: Yes   Plan:   Continue heparin drip at 1600 units/hr  Daily heparin level and CBC  Follow up post-procedure.  Arty Baumgartner, RPh 09/26/2020,7:59 AM   Addendum:   S/p TEE/DCCV   To transition from IV heparin to Eliquis.    Eliquis 5 mg BID.  Stop IV heparin when giving first Eliquis dose.  Consuello Masse, RPh 09/26/2020 10:49 AM

## 2020-09-26 NOTE — Procedures (Signed)
Procedure: Electrical Cardioversion Indications:  Atrial Fibrillation  Procedure Details:  Consent: Risks of procedure as well as the alternatives and risks of each were explained to the (patient/caregiver).  Consent for procedure obtained.  Time Out: Verified patient identification, verified procedure, site/side was marked, verified correct patient position, special equipment/implants available, medications/allergies/relevent history reviewed, required imaging and test results available. PERFORMED.  Patient placed on cardiac monitor, pulse oximetry, supplemental oxygen as necessary.  Sedation given: Lidocaine $RemoveBeforeDE'80mg'sITJMOEEnWAElGa$ ; propofol $RemoveBe'170mg'loxdLvgTG$  Pacer pads placed anterior and posterior chest.  Cardioverted 1 time(s).  Cardioversion with synchronized biphasic 200J shock.  Evaluation: Findings: Post procedure EKG shows: NSR Complications: None Patient did tolerate procedure well.  Time Spent Directly with the Patient:  74minutes   Freada Bergeron 09/26/2020, 9:30 AM

## 2020-09-26 NOTE — Progress Notes (Signed)
D/C instructions given and reviewed. Tele and IV removed, tolerated well. Awaiting TOC med delivery.

## 2020-09-26 NOTE — Progress Notes (Addendum)
Progress Note  Patient Name: Gita Dilger Date of Encounter: 09/26/2020  [facility] Cardiologist: Sinclair Grooms, MD   Subjective   Feeling better.  Breathing is improving.   Inpatient Medications    Scheduled Meds: . apixaban  5 mg Oral BID  . empagliflozin  10 mg Oral Daily  . furosemide  40 mg Oral Daily  . metoprolol tartrate  37.5 mg Oral Q8H  . sacubitril-valsartan  1 tablet Oral BID  . sodium chloride flush  3 mL Intravenous Q12H   Continuous Infusions: . sodium chloride    . heparin 1,600 Units/hr (09/26/20 0904)   PRN Meds: sodium chloride, acetaminophen, morphine injection, ondansetron (ZOFRAN) IV, sodium chloride flush   Vital Signs    Vitals:   09/26/20 0935 09/26/20 0940 09/26/20 0945 09/26/20 0955  BP: 102/83 104/83 104/80 106/89  Pulse: 81 82 80 71  Resp: $Remo'14 16 13 12  'jHMSE$ Temp: 98.2 F (36.8 C)     TempSrc: Axillary     SpO2: 97% 96% 95% 95%  Weight:      Height:        Intake/Output Summary (Last 24 hours) at 09/26/2020 1139 Last data filed at 09/26/2020 1040 Gross per 24 hour  Intake 663.76 ml  Output 400 ml  Net 263.76 ml   Last 3 Weights 09/26/2020 09/25/2020 09/24/2020  Weight (lbs) 273 lb 13 oz 274 lb 12.8 oz 275 lb 12.8 oz  Weight (kg) 124.2 kg 124.648 kg 125.102 kg      Telemetry    Atrial flutter.  Rate 100s.  PVCs. - Personally Reviewed  ECG    Atrial flutter.  Rate 117 bpm.  PVC.  LAFB - Personally Reviewed  Physical Exam   VS:  BP 106/89   Pulse 71   Temp 98.2 F (36.8 C) (Axillary)   Resp 12   Ht $R'5\' 9"'kl$  (1.753 m)   Wt 124.2 kg   SpO2 95%   BMI 40.43 kg/m  , BMI Body mass index is 40.43 kg/m. GENERAL:  Well appearing.  No acute distress HEENT: Pupils equal round and reactive, fundi not visualized, oral mucosa unremarkable NECK:  No jugular venous distention, waveform within normal limits, carotid upstroke brisk and symmetric, no bruits, no thyromegaly LYMPHATICS:  No cervical adenopathy LUNGS:  Clear to  auscultation bilaterally HEART:  Irregularly irregular.  PMI not displaced or sustained,S1 and S2 within normal limits, no S3, no S4, no clicks, no rubs, no murmurs ABD:  Flat, positive bowel sounds normal in frequency in pitch, no bruits, no rebound, no guarding, no midline pulsatile mass, no hepatomegaly, no splenomegaly EXT:  2 plus pulses throughout, no edema, no cyanosis no clubbing SKIN:  No rashes no nodules NEURO:  Cranial nerves II through XII grossly intact, motor grossly intact throughout PSYCH:  Cognitively intact, oriented to person place and time  Labs    High Sensitivity Troponin:   Recent Labs  Lab 09/22/20 0755 09/22/20 1026  TROPONINIHS 1,705* 4,469*     Chemistry Recent Labs  Lab 09/22/20 0755 09/23/20 0433 09/25/20 0328  NA 139 138 139  K 3.9 3.8 4.1  CL 103 103 99  CO2 $Re'27 26 31  'PUV$ GLUCOSE 137* 114* 137*  BUN $Re'12 8 18  'fgg$ CREATININE 1.09* 0.97 1.12*  CALCIUM 9.3 8.9 9.5  GFRNONAA >60 >60 >60  ANIONGAP $RemoveB'9 9 9    'cFICIOMA$ Hematology Recent Labs  Lab 09/24/20 0312 09/25/20 0328 09/26/20 0420  WBC 6.7 8.1 11.4*  RBC 4.84 5.56*  5.40*  HGB 14.5 16.3* 16.3*  HCT 43.6 50.2* 48.5*  MCV 90.1 90.3 89.8  MCH 30.0 29.3 30.2  MCHC 33.3 32.5 33.6  RDW 13.3 13.2 13.9  PLT 239 304 285   BNP Recent Labs  Lab 09/22/20 0755  BNP 872.5*    DDimer No results for input(s): DDIMER in the last 168 hours.   Radiology    CARDIAC CATHETERIZATION  Result Date: 09/24/2020  There is severe left ventricular systolic dysfunction.  LV end diastolic pressure is mildly elevated.  The left ventricular ejection fraction is less than 25% by visual estimate.  Makenze Ellett is a 50 y.o. female  [id number] LOCATION:  FACILITY: [facility] PHYSICIAN: [name], M.D. 1970/12/31 DATE OF PROCEDURE:  09/24/2020 DATE OF DISCHARGE: CARDIAC CATHETERIZATION History obtained from chart review.Brienna Tatumis a 50 y.o.femalewith a hx of non-ishemic cardiomyopathy, hypertension and hypothyroidismhere  with new onset atrial flutter and elevated troponin.   Ms. Oelkers has normal coronary arteries and severe LV dysfunction consistent with a nonischemic cardiomyopathy.  Guideline directed optimal medical therapy will be recommended.  TEE guided DC cardioversion is anticipated prior to discharge.  Sheath was removed and a TR band was placed on the right wrist to achieve patent hemostasis.  The patient left lab in stable condition.  IV heparin will be restarted 4 hours after sheath removal without a bolus. [name]. MD, [id number] [facility] 09/24/2020 1:58 PM   Cardiac Studies   Echo 03/04/19: 1. The left ventricle has severely reduced systolic function, with an  ejection fraction of 25-30%. The cavity size was severely dilated. Left  ventricular diastolic parameters were normal. Left ventrical global  hypokinesis without regional wall motion  abnormalities.  2. The right ventricle has normal systolic function. The cavity was  normal. There is no increase in right ventricular wall thickness.  3. Left atrial size was severely dilated.  4. The aortic valve is tricuspid. Moderate thickening of the aortic  valve. Mild calcification of the aortic valve.  5. The aorta is normal unless otherwise noted.    Patient Profile     Aneri Tatumis a 50 y.o.femalewith a hx of non-ishemic cardiomyopathy, hypertension and hypothyroidismhere with new onset atrial flutter and elevated troponin.   Assessment & Plan    # New onset atrial flutter: continue Toprol XL 75 mg po daily, s/p successful cardioversion today, switch to Eliquis 5 mg po bid. TSH is normal.   # NSTEMI: Troponin 1705->4469, values higher than we would expect just with demand ischemia, cath yesterday showed no CAD. This family history of premature coronary artery disease in her uncle and grandfather who both had heart attacks in their early 68s,  # Hyperlipidemia:  LDL mildly elevated at 109. No CAD on cath.  #Acute on chronic systolic  and diastolic heart failure: LVEF most recently 25 to 30%.  Interestingly her brother and 2 of her children died of Duchenne myopathy. BNP is elevated.  She was started with gentle diuresis and is net -1.1 L yesteday.  Renal function stable, creatinine 1.09.  Continue PO furosemide at home.  Needs consideration for ICD.  Switched to metoprolol instead of carvedilol for rate control.  Continue Entresto.  Started Jardiance, switch iv lasix to PO 40 mg daily,  We will discharge today, we will arrange for a follow up appointment within 5-10 days, consider adding spironolactone at the next clinic visit if BP allows.  # Hypertension:   For questions or updates, please contact [place] Please consult [email or web address] for contact  info under     Signed, Ena Dawley, MD  09/26/2020, 11:39 AM

## 2020-09-27 NOTE — Anesthesia Postprocedure Evaluation (Signed)
Anesthesia Post Note  Patient: Suzanne Long  Procedure(s) Performed: TRANSESOPHAGEAL ECHOCARDIOGRAM (TEE) (N/A ) CARDIOVERSION (N/A ) BUBBLE STUDY     Patient location during evaluation: Endoscopy Anesthesia Type: MAC Level of consciousness: awake and alert Pain management: pain level controlled Vital Signs Assessment: post-procedure vital signs reviewed and stable Respiratory status: spontaneous breathing, nonlabored ventilation, respiratory function stable and patient connected to nasal cannula oxygen Cardiovascular status: stable and blood pressure returned to baseline Postop Assessment: no apparent nausea or vomiting Anesthetic complications: no   No complications documented.  Last Vitals:  Vitals:   09/26/20 0945 09/26/20 0955  BP: 104/80 106/89  Pulse: 80 71  Resp: 13 12  Temp:    SpO2: 95% 95%    Last Pain:  Vitals:   09/26/20 1040  TempSrc:   PainSc: 0-No pain                 Emeric Novinger S

## 2020-09-28 ENCOUNTER — Encounter (HOSPITAL_COMMUNITY): Payer: Self-pay | Admitting: Cardiology

## 2020-10-01 ENCOUNTER — Ambulatory Visit (HOSPITAL_COMMUNITY)
Admit: 2020-10-01 | Discharge: 2020-10-01 | Disposition: A | Discharge status: Home / Self Care | Attending: Internal Medicine | Admitting: Internal Medicine

## 2020-10-01 ENCOUNTER — Other Ambulatory Visit: Payer: Self-pay

## 2020-10-01 ENCOUNTER — Telehealth (HOSPITAL_COMMUNITY): Payer: Self-pay

## 2020-10-01 ENCOUNTER — Encounter (HOSPITAL_COMMUNITY): Payer: Self-pay

## 2020-10-01 VITALS — BP 122/76 | HR 76 | Wt 276.2 lb

## 2020-10-01 DIAGNOSIS — Z7901 Long term (current) use of anticoagulants: Secondary | ICD-10-CM | POA: Diagnosis not present

## 2020-10-01 DIAGNOSIS — Z7984 Long term (current) use of oral hypoglycemic drugs: Secondary | ICD-10-CM | POA: Insufficient documentation

## 2020-10-01 DIAGNOSIS — I110 Hypertensive heart disease with heart failure: Secondary | ICD-10-CM | POA: Insufficient documentation

## 2020-10-01 DIAGNOSIS — I252 Old myocardial infarction: Secondary | ICD-10-CM | POA: Diagnosis not present

## 2020-10-01 DIAGNOSIS — E039 Hypothyroidism, unspecified: Secondary | ICD-10-CM | POA: Diagnosis not present

## 2020-10-01 DIAGNOSIS — Z79899 Other long term (current) drug therapy: Secondary | ICD-10-CM | POA: Diagnosis not present

## 2020-10-01 DIAGNOSIS — Z8249 Family history of ischemic heart disease and other diseases of the circulatory system: Secondary | ICD-10-CM | POA: Insufficient documentation

## 2020-10-01 DIAGNOSIS — E669 Obesity, unspecified: Secondary | ICD-10-CM | POA: Insufficient documentation

## 2020-10-01 DIAGNOSIS — I5022 Chronic systolic (congestive) heart failure: Secondary | ICD-10-CM | POA: Insufficient documentation

## 2020-10-01 DIAGNOSIS — I10 Essential (primary) hypertension: Secondary | ICD-10-CM

## 2020-10-01 DIAGNOSIS — I483 Typical atrial flutter: Secondary | ICD-10-CM

## 2020-10-01 DIAGNOSIS — E038 Other specified hypothyroidism: Secondary | ICD-10-CM

## 2020-10-01 DIAGNOSIS — Z8616 Personal history of COVID-19: Secondary | ICD-10-CM | POA: Insufficient documentation

## 2020-10-01 DIAGNOSIS — Z713 Dietary counseling and surveillance: Secondary | ICD-10-CM | POA: Diagnosis not present

## 2020-10-01 DIAGNOSIS — I428 Other cardiomyopathies: Secondary | ICD-10-CM

## 2020-10-01 DIAGNOSIS — I4892 Unspecified atrial flutter: Secondary | ICD-10-CM | POA: Diagnosis not present

## 2020-10-01 DIAGNOSIS — Z560 Unemployment, unspecified: Secondary | ICD-10-CM | POA: Insufficient documentation

## 2020-10-01 LAB — BASIC METABOLIC PANEL
Anion gap: 6 (ref 5–15)
BUN: 8 mg/dL (ref 6–20)
CO2: 31 mmol/L (ref 22–32)
Calcium: 9.4 mg/dL (ref 8.9–10.3)
Chloride: 100 mmol/L (ref 98–111)
Creatinine, Ser: 0.96 mg/dL (ref 0.44–1.00)
GFR, Estimated: >60 mL/min (ref >60)
Glucose, Bld: 95 mg/dL (ref 70–99)
Potassium: 4.6 mmol/L (ref 3.5–5.1)
Sodium: 137 mmol/L (ref 135–145)

## 2020-10-01 MED ORDER — METOPROLOL SUCCINATE ER 100 MG PO TB24: 100.0000 mg | ORAL_TABLET | Freq: Every day | ORAL | 11 refills | Status: DC

## 2020-10-01 MED ORDER — FUROSEMIDE 40 MG PO TABS: 40.0000 mg | ORAL_TABLET | Freq: Every day | ORAL | 1 refills | Status: DC | PRN

## 2020-10-01 MED ORDER — SPIRONOLACTONE 25 MG PO TABS: 12.5000 mg | ORAL_TABLET | Freq: Every day | ORAL | 3 refills | Status: DC

## 2020-10-01 NOTE — Addendum Note (Signed)
Encounter addended by: Katherine Roan, MD on: 10/01/2020 1:34 PM  Actions taken: Clinical Note Signed

## 2020-10-01 NOTE — Progress Notes (Addendum)
[facility]  PCP: Lennie Odor Primary Cardiologist: Loralie Champagne  HPI:  Suzanne Long is a 50 y.o.  female  with a PMH significant for Chronic Systolic CHF, NICM, HTN, Hypothyroidism, Obesity, covid 50 in jan 2021 relatively asymptomatic.    She was diagnosed with CHF initially in 2011.  At that time, she was hospitalized with volume overload.  Coronary angiography in 2011 showed no significant CAD, but EF was about 25%.  BP was very high.  She was started on cardiac meds and BP improved.  Per her report, EF improved.  She eventually stopped all her medications.  She was then hospitalized in 11/16 with CHF exacerbation. EF was 30-35% on 11/16 echo and 30-35% on 2/17 echo.   Echo in 7/17 showed EF up to 40%.   Not taking medications appropriately before admission, just taking intermittently.  09/22/20 Came in in Aflutter rate 120 and hypertensive 150/120. She had a repeat LHC which demonstrated normal coronaries.  She underwent successful TEE/DCCV, was diuresed and restarted on her entresto and jardiance was added, also started on metoprolol tartrate.     No drug or alcohol use.  Brother and two sons have muscular dystrophy. Does feel that her muscles are weak.  Can't squat will need help getting up.    Weighing herself at home 272-276 for the last several days.  She has been taking her lasix every day $RemoveB'40mg'uNmmUiLF$ .  Says she has good urine output with this.  Yesterday walked a mile and a half didn't have to stop for rest breaks, overall felt dramatically improved compared with before hospitalization.  Walking around 30 minutes daily.  Still using 2 pillows to prop her up but says she's used to doing this not sure if she needs it.    ROS: All systems negative except as listed in HPI, PMH and Problem List.  SH:  Social History   Socioeconomic History  . Marital status: Married    Spouse name: Suzanne Long  . Number of children: 2  . Years of education: 30  .  Highest education level: Not on file  Occupational History  . Occupation: unemployed  Tobacco Use  . Smoking status: Never Smoker  . Smokeless tobacco: Never Used  Vaping Use  . Vaping Use: Never used  Substance and Sexual Activity  . Alcohol use: Yes    Alcohol/week: 0.0 standard drinks    Comment: Occasional  . Drug use: No  . Sexual activity: Not on file  Other Topics Concern  . Not on file  Social History Narrative   Patient is married husband name Suzanne Long    Patient has 2 children. Both have past away.    Patient is right handed.    Patient is unemployed    Patient has a high school education    Social Determinants of Radio broadcast assistant Strain: Low Risk   . Difficulty of Paying Living Expenses: Not hard at all  Food Insecurity: No Food Insecurity  . Worried About Charity fundraiser in the Last Year: Never true  . Ran Out of Food in the Last Year: Never true  Transportation Needs: No Transportation Needs  . Lack of Transportation (Medical): No  . Lack of Transportation (Non-Medical): No  Physical Activity: Inactive  . Days of Exercise per Week: 0 days  . Minutes of Exercise per Session: 0 min  Stress: Not on file  Social Connections: Not on file  Intimate Partner Violence: Not  on file    FH:  Family History  Problem Relation Age of Onset  . Diabetes Mother   . Hypertension Mother   . Depression Mother   . Obesity Mother   . Muscular dystrophy Brother   . Kidney failure Brother   . Muscular dystrophy Son   . Migraines Neg Hx   . Multiple sclerosis Neg Hx   . Thyroid disease Neg Hx     Past Medical History:  Diagnosis Date  . CHF (congestive heart failure) ([place])   . Dyspnea   . Hypertension   . Hypothyroidism   . Joint pain   . Multiple food allergies   . NICM (nonischemic cardiomyopathy) ([place])    a. Echo 2/17:  moderate LVH, EF 30-35%, diffuse HK, moderate LAE  . NICM (nonischemic cardiomyopathy) ([place]) 09/26/2020  . Non-ST elevation  (NSTEMI) myocardial infarction ([place]) 09/26/2020    Current Outpatient Medications  Medication Sig Dispense Refill  . acetaminophen (TYLENOL) 325 MG tablet Take 2 tablets (650 mg total) by mouth every 4 (four) hours as needed for headache or mild pain.    Marland Kitchen apixaban (ELIQUIS) 5 MG TABS tablet Take 1 tablet (5 mg total) by mouth 2 (two) times daily. 60 tablet 6  . empagliflozin (JARDIANCE) 10 MG TABS tablet Take 1 tablet (10 mg total) by mouth daily. 30 tablet 6  . furosemide (LASIX) 40 MG tablet Take 1 tablet (40 mg total) by mouth daily. 30 tablet 1  . Metoprolol Tartrate (LOPRESSOR) 25 MG tablet Take 1.5 tablets (37.5 mg total) by mouth every 8 (eight) hours. 135 tablet 6  . phenol (CHLORASEPTIC) 1.4 % LIQD Use as directed 1 spray in the mouth or throat as needed for throat irritation / pain.  0  . polyvinyl alcohol (LIQUIFILM TEARS) 1.4 % ophthalmic solution Place 1 drop into both eyes 3 (three) times daily.    . sacubitril-valsartan (ENTRESTO) 49-51 MG Take 1 tablet by mouth 2 (two) times daily. 60 tablet 1   No current facility-administered medications for this encounter.    Vitals:   10/01/20 0902  BP: 122/76  Pulse: 76  SpO2: 100%  Weight: 125.3 kg (276 lb 3.2 oz)    PHYSICAL EXAM: Cardiac: JVD flat, normal rate and rhythm, clear s1 and s2, no murmurs, rubs or gallops, no LE edema Pulmonary: CTAB, not in distress Abdominal: non distended abdomen, soft and nontender Psych: Alert, conversant, in good spirits  ECG  NSR rate 58, one PVC  ASSESSMENT & PLAN:  Chronic systolic CHF: Nonischemic cardiomyopathy.   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  EF improved in the past with medications.  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 however uncertain if this is contributing or not, may benefit  from referral to geneticist, cMRI, creatinine kinase -NYHA class II symptoms, euvolemic on exam -needs sleep study she denies snoring but is high risk especially since overweight and possible symptomatic carrier -continue entresto 49/51, jardiance 10 -switch metoprolol tartrate to succinate equivalent 100mg  daily -add spironolactone 12.5mg  daily, switch lasix to 40mg  PRN, bmp today and in one week  Atrial Flutter:   -CHADS2VASC score 3 -in NSR today -continue eliquis -switch metoprolol tartrate to succinate 100mg  daily  HTN:    -Improved on restarting her medications -add spiro    H/o hypothyroidism:  -  Not on thyroid replacement followed [facility] in the past -TSH normal 10/08/20  Obesity:  -She is exercising daily -some work to do on diet still   Follow up AHF

## 2020-10-01 NOTE — Progress Notes (Signed)
[facility] Transitions of Care Clinic Heart Failure Pharmacist Encounter  HPI:  50 yo F with PMH of NICM, HTN, and hypothyroidism. She presented to the ED on 09/22/20 with chest pain and shortness of breath. She was found to be in new onset atrial flutter and acute on chronic heart failure. Her last TTE was done on 03/04/2019 and LVEF was 25-30% (down from 40% in 2017). LHC was done on 09/24/20 and found to have normal coronaries and estimated LVEF of <25%. TEE/DCCV done on 09/26/20 and LVEF was 20-25%. She was then discharged on 09/26/20.  Today, Suzanne Long presents to the [facility] for follow up. She reports she has been feeling well since discharge. She denies having shortness of breath, DOE, orthopnea, PND, lightheadedness or dizziness. She has mild LE edema on exam. She has been weighing herself daily at home with reported weights of 272-276 lbs. She reports she has only checked her BP once since being discharged from the hospital and it was low around 96/86 mmHg. She has been following a low-sodium and fluid restricted diet at home. She has been taking her medications as prescribed and does not use a pill box at home. She states when taking her lasix, sometimes she will wait to take it at night depending on what she has to do during the day.  HF Medications: Furosemide 40 mg daily Metoprolol tartrate 37.5 mg q8h Entresto 49/51 mg BID Jardiance 10 mg daily  Has the patient been experiencing any side effects to the medications prescribed?  no  Does the patient have any problems obtaining medications due to transportation or finances?   no  Understanding of regimen: good Understanding of indications: good Potential of compliance: good Patient understands to avoid NSAIDs. Patient understands to avoid decongestants.   Pertinent Lab Values: . Serum creatinine 1.12, BUN 18, Potassium 4.1, Sodium 139, BNP 872.5, Magnesium 1.9   Vital Signs: . Weight: 276 lbs  (discharge weight: 273 lbs) . Blood pressure: 122/76 mmHg  . Heart rate: 76 bpm   Medication Assistance / Insurance Benefits Check: Does the patient have prescription insurance?  Yes Type of insurance plan: Tricare  Outpatient Pharmacy:  Current outpatient pharmacy: [facility] Was the [facility] pharmacy used to supply discharge medications? yes  If [facility] pharmacy was used, were the refills transferred out to current pharmacy yet? No - will contact [facility] PharmD to initiate transfers to [facility]  Is the patient willing to transition their outpatient pharmacy to utilize a [facility] outpatient pharmacy with or without mail order?   No  - prefers to stay with [facility] for now  Assessment: 1) Chronic systolic CHF (EF 28-78%), due to NICM. NYHA class II symptoms. - Change furosemide to 40 mg daily PRN - Change metoprolol tartrate to metoprolol succinate 100 mg daily - Continue Entresto 49/51 mg BID - Start spironolactone 12.5 mg daily - Continue Jardiance 10 mg daily - Check BMET today  Plan:  1) Medication changes: - Change furosemide to 40 mg daily PRN - Change metoprolol tartrate to metoprolol succinate 100 mg daily - Start spironolactone 12.5 mg daily  2) Patient Assistance: - Provided weight log and pill splitter to patient today  3) Follow up: - Next appointment with lab in 1 week - Next appointment with Dr. Aundra Dubin on 11/15/20  Kerby Nora, PharmD, BCPS Heart Failure Transitions of Care Clinic Pharmacist [phone]

## 2020-10-01 NOTE — Patient Instructions (Addendum)
Labs done today. We will contact you only if your labs are abnormal.  START Spironolactone 12.5mg  (1/2 tablet) by mouth daily.   START Metoprolol 100mg  (1 tablet) by mouth daily.   Only take lasix as needed.  No other medication changes were made. Please continue all current medications as prescribed.  Your physician recommends that you schedule a follow-up appointment in: 1 week for a lab only appointment and in 2-3 months for an appointment with Dr. Aundra Dubin   If you have any questions or concerns before your next appointment please send [place] a message through [facility] or call our office at [phone].    TO LEAVE A MESSAGE FOR THE NURSE SELECT OPTION 2, PLEASE LEAVE A MESSAGE INCLUDING: . YOUR NAME . DATE OF BIRTH . CALL BACK NUMBER . REASON FOR CALL**this is important as we prioritize the call backs  YOU WILL RECEIVE A CALL BACK THE SAME DAY AS LONG AS YOU CALL BEFORE 4:00 PM   Do the following things EVERYDAY: 1) Weigh yourself in the morning before breakfast. Write it down and keep it in a log. 2) Take your medicines as prescribed 3) Eat low salt foods--Limit salt (sodium) to 2000 mg per day.  4) Stay as active as you can everyday 5) Limit all fluids for the day to less than 2 liters   At the [facility], you and your health needs are our priority. As part of our continuing mission to provide you with exceptional heart care, we have created designated Provider Care Teams. These Care Teams include your primary Cardiologist (physician) and Advanced Practice Providers (APPs- Physician Assistants and Nurse Practitioners) who all work together to provide you with the care you need, when you need it.   You may see any of the following providers on your designated Care Team at your next follow up: Marland Kitchen Dr Glori Bickers . Dr Loralie Champagne . Darrick Grinder, NP . Lyda Jester, PA . Audry Riles, PharmD   Please be sure to bring in all your medications bottles to  every appointment.  '

## 2020-10-01 NOTE — Telephone Encounter (Signed)
Called to confirm Heart & Vascular Transitions of Care appointment at today at 0900. Patient reminded to bring all medications and pill box organizer with them. Confirmed patient has transportation. Gave directions, instructed to utilize [place] parking.  Confirmed appointment prior to ending call.

## 2020-10-08 ENCOUNTER — Telehealth (HOSPITAL_COMMUNITY): Payer: Self-pay | Admitting: Internal Medicine

## 2020-10-08 ENCOUNTER — Ambulatory Visit (HOSPITAL_COMMUNITY)
Admission: RE | Admit: 2020-10-08 | Discharge: 2020-10-08 | Disposition: A | Discharge status: Home / Self Care | Source: Ambulatory Visit | Attending: Internal Medicine | Admitting: Internal Medicine

## 2020-10-08 ENCOUNTER — Other Ambulatory Visit: Payer: Self-pay

## 2020-10-08 DIAGNOSIS — I5022 Chronic systolic (congestive) heart failure: Secondary | ICD-10-CM | POA: Insufficient documentation

## 2020-10-08 LAB — BASIC METABOLIC PANEL
Anion gap: 6 (ref 5–15)
BUN: 16 mg/dL (ref 6–20)
CO2: 30 mmol/L (ref 22–32)
Calcium: 10.1 mg/dL (ref 8.9–10.3)
Chloride: 103 mmol/L (ref 98–111)
Creatinine, Ser: 1 mg/dL (ref 0.44–1.00)
GFR, Estimated: >60 mL/min (ref >60)
Glucose, Bld: 92 mg/dL (ref 70–99)
Potassium: 5.2 mmol/L — ABNORMAL HIGH (ref 3.5–5.1)
Sodium: 139 mmol/L (ref 135–145)

## 2020-10-08 NOTE — Telephone Encounter (Signed)
Spoke with patient about lab results, elevated potassium otherwise normal.  She reports she has had some lower bp with the addition of spironolactone as well 100/70.  We discussed just holding her spiro for now with hyperkalemia and hypotension.

## 2020-10-17 ENCOUNTER — Ambulatory Visit: Admitting: Physician Assistant

## 2020-11-06 ENCOUNTER — Encounter: Payer: Self-pay | Admitting: Family Medicine

## 2020-11-06 ENCOUNTER — Ambulatory Visit: Attending: Family Medicine | Admitting: Family Medicine

## 2020-11-06 ENCOUNTER — Other Ambulatory Visit: Payer: Self-pay

## 2020-11-06 VITALS — BP 110/75 | HR 66 | Resp 16 | Ht 68.0 in | Wt 271.0 lb

## 2020-11-06 DIAGNOSIS — I483 Typical atrial flutter: Secondary | ICD-10-CM | POA: Diagnosis not present

## 2020-11-06 DIAGNOSIS — Z131 Encounter for screening for diabetes mellitus: Secondary | ICD-10-CM | POA: Diagnosis not present

## 2020-11-06 DIAGNOSIS — I428 Other cardiomyopathies: Secondary | ICD-10-CM

## 2020-11-06 DIAGNOSIS — E038 Other specified hypothyroidism: Secondary | ICD-10-CM | POA: Diagnosis not present

## 2020-11-06 DIAGNOSIS — Z6841 Body Mass Index (BMI) 40.0 and over, adult: Secondary | ICD-10-CM

## 2020-11-06 LAB — POCT GLYCOSYLATED HEMOGLOBIN (HGB A1C): HbA1c, POC (controlled diabetic range): 5.4 % (ref 0.0–7.0)

## 2020-11-06 NOTE — Patient Instructions (Signed)
Calorie Counting for Weight Loss Calories are units of energy. Your body needs a certain number of calories from food to keep going throughout the day. When you eat or drink more calories than your body needs, your body stores the extra calories mostly as fat. When you eat or drink fewer calories than your body needs, your body burns fat to get the energy it needs. Calorie counting means keeping track of how many calories you eat and drink each day. Calorie counting can be helpful if you need to lose weight. If you eat fewer calories than your body needs, you should lose weight. Ask your health care provider what a healthy weight is for you. For calorie counting to work, you will need to eat the right number of calories each day to lose a healthy amount of weight per week. A dietitian can help you figure out how many calories you need in a day and will suggest ways to reach your calorie goal.  A healthy amount of weight to lose each week is usually 1-2 lb (0.5-0.9 kg). This usually means that your daily calorie intake should be reduced by 500-750 calories.  Eating 1,200-1,500 calories a day can help most women lose weight.  Eating 1,500-1,800 calories a day can help most men lose weight. What do I need to know about calorie counting? Work with your health care provider or dietitian to determine how many calories you should get each day. To meet your daily calorie goal, you will need to:  Find out how many calories are in each food that you would like to eat. Try to do this before you eat.  Decide how much of the food you plan to eat.  Keep a food log. Do this by writing down what you ate and how many calories it had. To successfully lose weight, it is important to balance calorie counting with a healthy lifestyle that includes regular activity. Where do I find calorie information? The number of calories in a food can be found on a Nutrition Facts label. If a food does not have a Nutrition Facts  label, try to look up the calories online or ask your dietitian for help. Remember that calories are listed per serving. If you choose to have more than one serving of a food, you will have to multiply the calories per serving by the number of servings you plan to eat. For example, the label on a package of bread might say that a serving size is 1 slice and that there are 90 calories in a serving. If you eat 1 slice, you will have eaten 90 calories. If you eat 2 slices, you will have eaten 180 calories.   How do I keep a food log? After each time that you eat, record the following in your food log as soon as possible:  What you ate. Be sure to include toppings, sauces, and other extras on the food.  How much you ate. This can be measured in cups, ounces, or number of items.  How many calories were in each food and drink.  The total number of calories in the food you ate. Keep your food log near you, such as in a pocket-sized notebook or on an app or website on your mobile phone. Some programs will calculate calories for you and show you how many calories you have left to meet your daily goal. What are some portion-control tips?  Know how many calories are in a serving. This will   help you know how many servings you can have of a certain food.  Use a measuring cup to measure serving sizes. You could also try weighing out portions on a kitchen scale. With time, you will be able to estimate serving sizes for some foods.  Take time to put servings of different foods on your favorite plates or in your favorite bowls and cups so you know what a serving looks like.  Try not to eat straight from a food's packaging, such as from a bag or box. Eating straight from the package makes it hard to see how much you are eating and can lead to overeating. Put the amount you would like to eat in a cup or on a plate to make sure you are eating the right portion.  Use smaller plates, glasses, and bowls for smaller  portions and to prevent overeating.  Try not to multitask. For example, avoid watching TV or using your computer while eating. If it is time to eat, sit down at a table and enjoy your food. This will help you recognize when you are full. It will also help you be more mindful of what and how much you are eating. What are tips for following this plan? Reading food labels  Check the calorie count compared with the serving size. The serving size may be smaller than what you are used to eating.  Check the source of the calories. Try to choose foods that are high in protein, fiber, and vitamins, and low in saturated fat, trans fat, and sodium. Shopping  Read nutrition labels while you shop. This will help you make healthy decisions about which foods to buy.  Pay attention to nutrition labels for low-fat or fat-free foods. These foods sometimes have the same number of calories or more calories than the full-fat versions. They also often have added sugar, starch, or salt to make up for flavor that was removed with the fat.  Make a grocery list of lower-calorie foods and stick to it. Cooking  Try to cook your favorite foods in a healthier way. For example, try baking instead of frying.  Use low-fat dairy products. Meal planning  Use more fruits and vegetables. One-half of your plate should be fruits and vegetables.  Include lean proteins, such as chicken, turkey, and fish. Lifestyle Each week, aim to do one of the following:  150 minutes of moderate exercise, such as walking.  75 minutes of vigorous exercise, such as running. General information  Know how many calories are in the foods you eat most often. This will help you calculate calorie counts faster.  Find a way of tracking calories that works for you. Get creative. Try different apps or programs if writing down calories does not work for you. What foods should I eat?  Eat nutritious foods. It is better to have a nutritious,  high-calorie food, such as an avocado, than a food with few nutrients, such as a bag of potato chips.  Use your calories on foods and drinks that will fill you up and will not leave you hungry soon after eating. ? Examples of foods that fill you up are nuts and nut butters, vegetables, lean proteins, and high-fiber foods such as whole grains. High-fiber foods are foods with more than 5 g of fiber per serving.  Pay attention to calories in drinks. Low-calorie drinks include water and unsweetened drinks. The items listed above may not be a complete list of foods and beverages you can eat.   Contact a dietitian for more information.   What foods should I limit? Limit foods or drinks that are not good sources of vitamins, minerals, or protein or that are high in unhealthy fats. These include:  Candy.  Other sweets.  Sodas, specialty coffee drinks, alcohol, and juice. The items listed above may not be a complete list of foods and beverages you should avoid. Contact a dietitian for more information. How do I count calories when eating out?  Pay attention to portions. Often, portions are much larger when eating out. Try these tips to keep portions smaller: ? Consider sharing a meal instead of getting your own. ? If you get your own meal, eat only half of it. Before you start eating, ask for a container and put half of your meal into it. ? When available, consider ordering smaller portions from the menu instead of full portions.  Pay attention to your food and drink choices. Knowing the way food is cooked and what is included with the meal can help you eat fewer calories. ? If calories are listed on the menu, choose the lower-calorie options. ? Choose dishes that include vegetables, fruits, whole grains, low-fat dairy products, and lean proteins. ? Choose items that are boiled, broiled, grilled, or steamed. Avoid items that are buttered, battered, fried, or served with cream sauce. Items labeled as  crispy are usually fried, unless stated otherwise. ? Choose water, low-fat milk, unsweetened iced tea, or other drinks without added sugar. If you want an alcoholic beverage, choose a lower-calorie option, such as a glass of wine or light beer. ? Ask for dressings, sauces, and syrups on the side. These are usually high in calories, so you should limit the amount you eat. ? If you want a salad, choose a garden salad and ask for grilled meats. Avoid extra toppings such as bacon, cheese, or fried items. Ask for the dressing on the side, or ask for olive oil and vinegar or lemon to use as dressing.  Estimate how many servings of a food you are given. Knowing serving sizes will help you be aware of how much food you are eating at restaurants. Where to find more information  Centers for Disease Control and Prevention: www.cdc.gov  [place] Department of Agriculture: myplate.gov Summary  Calorie counting means keeping track of how many calories you eat and drink each day. If you eat fewer calories than your body needs, you should lose weight.  A healthy amount of weight to lose per week is usually 1-2 lb (0.5-0.9 kg). This usually means reducing your daily calorie intake by 500-750 calories.  The number of calories in a food can be found on a Nutrition Facts label. If a food does not have a Nutrition Facts label, try to look up the calories online or ask your dietitian for help.  Use smaller plates, glasses, and bowls for smaller portions and to prevent overeating.  Use your calories on foods and drinks that will fill you up and not leave you hungry shortly after a meal. This information is not intended to replace advice given to you by your health care provider. Make sure you discuss any questions you have with your health care provider. Document Revised: 08/11/2019 Document Reviewed: 08/11/2019 Elsevier Patient Education  2021 Elsevier Inc.  

## 2020-11-06 NOTE — Progress Notes (Signed)
Subjective:  Patient ID: Suzanne Long, female    DOB: March 04, 1971  Age: 50 y.o. MRN: [id number]  CC: Hospitalization Follow-up   HPI Suzanne Long is a 50 year old female with a history of nonischemic cardiomyopathy (EF of 25 to 30%), atrial flutter/atrial fibrillation, hypertension, hyperlipidemia, hypothyroidism (not on medications) recently hospitalized for acute on chronic CHF, history of COVID in 07/2019 here to establish care. Previously [facility] by [facility] physicians. During hospitalization she underwent successful TEE DCCV. Currently on her cardiac medications including Jardiance and Lasix.  States she has had a follow-up with cardiology.  With regards to her hypothyroidism she is currently not on medications. Last  TSH from 09/2020 was normal.  She has seen a Nutritionist and has been to the [facility] due to her concern for weight loss and does not want to have gastric bypass as she does have a tendency to form keloids. States she was going  to the [facility] previously. She states she has no appetite and would not like to have an appetite suppressant to assist in her weight loss. She has no additional concerns today. Past Medical History:  Diagnosis Date  . CHF (congestive heart failure) ([place])   . Dyspnea   . Hypertension   . Hypothyroidism   . Joint pain   . Multiple food allergies   . NICM (nonischemic cardiomyopathy) ([place])    a. Echo 2/17:  moderate LVH, EF 30-35%, diffuse HK, moderate LAE  . NICM (nonischemic cardiomyopathy) ([place]) 09/26/2020  . Non-ST elevation (NSTEMI) myocardial infarction [facility]) 09/26/2020    Past Surgical History:  Procedure Laterality Date  . BUBBLE STUDY  09/26/2020   Procedure: BUBBLE STUDY;  Surgeon: Freada Bergeron, MD;  Location: [place];  Service: Cardiovascular;;  . CARDIOVERSION N/A 09/26/2020   Procedure: CARDIOVERSION;  Surgeon: Freada Bergeron, MD;  Location: [place];  Service: Cardiovascular;  Laterality: N/A;  . LEFT  HEART CATH AND CORONARY ANGIOGRAPHY N/A 09/24/2020   Procedure: LEFT HEART CATH AND CORONARY ANGIOGRAPHY;  Surgeon: Lorretta Harp, MD;  Location: [place] CV LAB;  Service: Cardiovascular;  Laterality: N/A;  . MYOMECTOMY    . None    . TEE WITHOUT CARDIOVERSION N/A 09/26/2020   Procedure: TRANSESOPHAGEAL ECHOCARDIOGRAM (TEE);  Surgeon: Freada Bergeron, MD;  Location: [facility] ENDOSCOPY;  Service: Cardiovascular;  Laterality: N/A;    Family History  Problem Relation Age of Onset  . Diabetes Mother   . Hypertension Mother   . Depression Mother   . Obesity Mother   . Muscular dystrophy Brother   . Kidney failure Brother   . Muscular dystrophy Son   . Migraines Neg Hx   . Multiple sclerosis Neg Hx   . Thyroid disease Neg Hx     Allergies  Allergen Reactions  . Shellfish Allergy Anaphylaxis    Uncoded Allergy. Allergen: SHELLFISH  . Gadolinium Derivatives Nausea And Vomiting    Vomiting after 20cc multihance injection  . Iodine Itching  . Latex Rash    Outpatient Medications Prior to Visit  Medication Sig Dispense Refill  . apixaban (ELIQUIS) 5 MG TABS tablet Take 1 tablet (5 mg total) by mouth 2 (two) times daily. 60 tablet 6  . empagliflozin (JARDIANCE) 10 MG TABS tablet Take 1 tablet (10 mg total) by mouth daily. 30 tablet 6  . furosemide (LASIX) 40 MG tablet Take 1 tablet (40 mg total) by mouth daily as needed for fluid or edema. 30 tablet 1  . metoprolol succinate (TOPROL XL) 100  MG 24 hr tablet Take 1 tablet (100 mg total) by mouth daily. Take with or immediately following a meal. 30 tablet 11  . sacubitril-valsartan (ENTRESTO) 49-51 MG Take 1 tablet by mouth 2 (two) times daily. 60 tablet 1  . acetaminophen (TYLENOL) 325 MG tablet Take 2 tablets (650 mg total) by mouth every 4 (four) hours as needed for headache or mild pain. (Patient not taking: Reported on 11/06/2020)    . phenol (CHLORASEPTIC) 1.4 % LIQD Use as directed 1 spray in the mouth or throat as needed for  throat irritation / pain. (Patient not taking: Reported on 11/06/2020)  0  . polyvinyl alcohol (LIQUIFILM TEARS) 1.4 % ophthalmic solution Place 1 drop into both eyes 3 (three) times daily. (Patient not taking: Reported on 11/06/2020)     No facility-administered medications prior to visit.     ROS Review of Systems  Constitutional: Negative for activity change, appetite change and fatigue.  HENT: Negative for congestion, sinus pressure and sore throat.   Eyes: Negative for visual disturbance.  Respiratory: Negative for cough, chest tightness, shortness of breath and wheezing.   Cardiovascular: Negative for chest pain and palpitations.  Gastrointestinal: Negative for abdominal distention, abdominal pain and constipation.  Endocrine: Negative for polydipsia.  Genitourinary: Negative for dysuria and frequency.  Musculoskeletal: Negative for arthralgias and back pain.  Skin: Negative for rash.  Neurological: Negative for tremors, light-headedness and numbness.  Hematological: Does not bruise/bleed easily.  Psychiatric/Behavioral: Negative for agitation and behavioral problems.    Objective:  BP 110/75   Pulse 66   Resp 16   Ht $R'5\' 8"'FK$  (1.727 m)   Wt 271 lb (122.9 kg)   SpO2 97%   BMI 41.21 kg/m   BP/Weight 11/06/2020 10/01/2020 1/61/0960  Systolic BP 454 098 119  Diastolic BP 75 76 89  Wt. (Lbs) 271 276.2 273.81  BMI 41.21 40.79 40.43      Physical Exam Constitutional:      Appearance: She is well-developed. She is obese.     Comments: Obese  Neck:     Vascular: No JVD.  Cardiovascular:     Rate and Rhythm: Normal rate.     Heart sounds: Normal heart sounds. No murmur heard.   Pulmonary:     Effort: Pulmonary effort is normal.     Breath sounds: Normal breath sounds. No wheezing or rales.  Chest:     Chest wall: No tenderness.  Abdominal:     General: Bowel sounds are normal. There is no distension.     Palpations: Abdomen is soft. There is no mass.      Tenderness: There is no abdominal tenderness.  Musculoskeletal:        General: Normal range of motion.     Right lower leg: No edema.     Left lower leg: No edema.  Neurological:     Mental Status: She is alert and oriented to person, place, and time.  Psychiatric:        Mood and Affect: Mood normal.     CMP Latest Ref Rng & Units 10/08/2020 10/01/2020 09/25/2020  Glucose 70 - 99 mg/dL 92 95 137(H)  BUN 6 - 20 mg/dL $Remove'16 8 18  'HwgPVGa$ Creatinine 0.44 - 1.00 mg/dL 1.00 0.96 1.12(H)  Sodium 135 - 145 mmol/L 139 137 139  Potassium 3.5 - 5.1 mmol/L 5.2(H) 4.6 4.1  Chloride 98 - 111 mmol/L 103 100 99  CO2 22 - 32 mmol/L $RemoveB'30 31 31  'aqnJAmrz$ Calcium 8.9 - 10.3  mg/dL 10.1 9.4 9.5  Total Protein 6.0 - 8.5 g/dL - - -  Total Bilirubin 0.0 - 1.2 mg/dL - - -  Alkaline Phos 39 - 117 IU/L - - -  AST 0 - 40 IU/L - - -  ALT 0 - 32 IU/L - - -    Lipid Panel     Component Value Date/Time   CHOL 170 09/23/2020 0433   CHOL 175 05/31/2019 1140   TRIG 80 09/23/2020 0433   HDL 45 09/23/2020 0433   HDL 51 05/31/2019 1140   CHOLHDL 3.8 09/23/2020 0433   VLDL 16 09/23/2020 0433   LDLCALC 109 (H) 09/23/2020 0433   LDLCALC 112 (H) 05/31/2019 1140    CBC    Component Value Date/Time   WBC 11.4 (H) 09/26/2020 0420   RBC 5.40 (H) 09/26/2020 0420   HGB 16.3 (H) 09/26/2020 0420   HGB 15.1 05/31/2019 1140   HGB 12.6 11/26/2007 1404   HCT 48.5 (H) 09/26/2020 0420   HCT 43.6 05/31/2019 1140   HCT 37.0 11/26/2007 1404   PLT 285 09/26/2020 0420   PLT 293 05/31/2019 1140   MCV 89.8 09/26/2020 0420   MCV 89 05/31/2019 1140   MCV 82.4 11/26/2007 1404   MCH 30.2 09/26/2020 0420   MCHC 33.6 09/26/2020 0420   RDW 13.9 09/26/2020 0420   RDW 12.7 05/31/2019 1140   RDW 13.7 11/26/2007 1404   LYMPHSABS 1.9 05/31/2019 1140   LYMPHSABS 1.6 11/26/2007 1404   MONOABS 0.4 05/29/2015 0131   MONOABS 0.3 11/26/2007 1404   EOSABS 0.2 05/31/2019 1140   BASOSABS 0.0 05/31/2019 1140   BASOSABS 0.0 11/26/2007 1404    Lab  Results  Component Value Date   HGBA1C 5.4 11/06/2020    Assessment & Plan:  1. NICM (nonischemic cardiomyopathy) ([place]) EF 20 to 25% from echo 09/2020 Euvolemic Continue Delene Loll, Jardiance and Lasix Follow-up with cardiology  2. Typical atrial flutter (HCC) Status post successful TEE DCCV Continue Eliquis  3. Class 3 severe obesity with serious comorbidity and body mass index (BMI) of 40.0 to 44.9 in adult, unspecified obesity type ([place]) She is not open to bariatric surgery which is likely to achieve best results Counseled on 150 minutes of moderate intensity exercise including combination of weight training and cardio.  Discussed dietary modifications and caloric restriction to 1200 cal/day I have offered to refer her to [facility] weight management which she declined  4. Screening for diabetes mellitus A1c of 5.4 - POCT glycosylated hemoglobin (Hb A1C)  5. Subclinical hypothyroidism Controlled off medications    No orders of the defined types were placed in this encounter.   Follow-up: Return in about 6 months (around 05/08/2021) for chronic disease management.       Charlott Rakes, MD, FAAFP. [facility] [place], [place]   11/07/2020, 1:00 PM

## 2020-11-15 ENCOUNTER — Ambulatory Visit (HOSPITAL_COMMUNITY)
Admission: RE | Admit: 2020-11-15 | Discharge: 2020-11-15 | Disposition: A | Discharge status: Home / Self Care | Source: Ambulatory Visit | Attending: Cardiology | Admitting: Cardiology

## 2020-11-15 ENCOUNTER — Encounter (HOSPITAL_COMMUNITY): Payer: Self-pay | Admitting: Cardiology

## 2020-11-15 ENCOUNTER — Other Ambulatory Visit: Payer: Self-pay

## 2020-11-15 VITALS — BP 112/70 | HR 50 | Wt 276.0 lb

## 2020-11-15 DIAGNOSIS — E669 Obesity, unspecified: Secondary | ICD-10-CM | POA: Insufficient documentation

## 2020-11-15 DIAGNOSIS — Z7901 Long term (current) use of anticoagulants: Secondary | ICD-10-CM | POA: Insufficient documentation

## 2020-11-15 DIAGNOSIS — Z7984 Long term (current) use of oral hypoglycemic drugs: Secondary | ICD-10-CM | POA: Insufficient documentation

## 2020-11-15 DIAGNOSIS — I428 Other cardiomyopathies: Secondary | ICD-10-CM | POA: Insufficient documentation

## 2020-11-15 DIAGNOSIS — I110 Hypertensive heart disease with heart failure: Secondary | ICD-10-CM | POA: Insufficient documentation

## 2020-11-15 DIAGNOSIS — Z6841 Body Mass Index (BMI) 40.0 and over, adult: Secondary | ICD-10-CM | POA: Diagnosis not present

## 2020-11-15 DIAGNOSIS — I5022 Chronic systolic (congestive) heart failure: Secondary | ICD-10-CM | POA: Diagnosis not present

## 2020-11-15 DIAGNOSIS — E039 Hypothyroidism, unspecified: Secondary | ICD-10-CM | POA: Insufficient documentation

## 2020-11-15 DIAGNOSIS — Z8249 Family history of ischemic heart disease and other diseases of the circulatory system: Secondary | ICD-10-CM | POA: Insufficient documentation

## 2020-11-15 DIAGNOSIS — I340 Nonrheumatic mitral (valve) insufficiency: Secondary | ICD-10-CM

## 2020-11-15 DIAGNOSIS — Z79899 Other long term (current) drug therapy: Secondary | ICD-10-CM | POA: Insufficient documentation

## 2020-11-15 DIAGNOSIS — I483 Typical atrial flutter: Secondary | ICD-10-CM | POA: Insufficient documentation

## 2020-11-15 LAB — BASIC METABOLIC PANEL
Anion gap: 5 (ref 5–15)
BUN: 10 mg/dL (ref 6–20)
CO2: 31 mmol/L (ref 22–32)
Calcium: 9.1 mg/dL (ref 8.9–10.3)
Chloride: 103 mmol/L (ref 98–111)
Creatinine, Ser: 0.89 mg/dL (ref 0.44–1.00)
GFR, Estimated: >60 mL/min (ref >60)
Glucose, Bld: 84 mg/dL (ref 70–99)
Potassium: 4.5 mmol/L (ref 3.5–5.1)
Sodium: 139 mmol/L (ref 135–145)

## 2020-11-15 MED ORDER — SPIRONOLACTONE 25 MG PO TABS: 12.5000 mg | ORAL_TABLET | Freq: Every day | ORAL | 3 refills | Status: DC

## 2020-11-15 NOTE — Progress Notes (Signed)
Patient ID: Suzanne Long, female   DOB: Sep 01, 1970, 50 y.o.   MRN: [id number] PCP: Lennie Odor HF Cardiology: Dr. Aundra Dubin  50 y.o. with history of HTN and nonischemic cardiomyopathy presents for followup of CHF.      She was diagnosed with CHF initially in 2011.  At that time, she was hospitalized with volume overload.  Coronary angiography in 2011 showed no significant CAD, but EF was about 25%.  BP was very high.  She was started on cardiac meds and BP improved.  Per her report, EF improved.  She eventually stopped all her medications.  She was then hospitalized in 11/16 with CHF exacerbation. EF was 30-35% on 11/16 echo and 30-35% on 2/17 echo.   Echo in 7/17 showed EF up to 40%.   She stopped her medications again.  She was then admitted in 3/22 with atrial flutter/RVR and CHF.  Unsure how long atrial flutter was present as she did not feel palpitations.  She had TEE-guided DCCV.  TEE showed EF 20-25% with severely decreased RV function, mild MR.  LHC in 3/22 showed no significant CAD.    Patient returns for followup of CHF.  She is in NSR today.  No palpitations.  She is walking 3-4 miles/day without dyspnea.  No dyspnea walking up a flight of stairs.  No lightheadedness.  She has chronic leg weakness, she struggles to go from squatting to standing.  This is chronic.  Weight is stable.    Labs (3/17): K 3.7, creatinine 0.84, SPEP negative, TSH normal.  Labs (5/17): K 3.9, creatinine 0.81, BNP normal Labs (3/22): K 5.2, creatinine 1.0  ECG (personally reviewed): NSR at 49, septal Qs, lateral TWIs  PMH: 1. Chronic systolic CHF: 1st noted in 2011.  Nonischemic cardiomyopathy.  - LHC (2011) with no significant CAD, EF 25%.   - Echo (11/16) with EF 30-35%, diffuse hypokinesis. - Echo (2/17) with EF 30-35%, moderate LVH, diffuse hypokinesis.  - Echo (7/17) with EF 40%, severe LV dilation, grade II diastolic dysfunction.  - TEE (3/22) with EF 20-25% with severely decreased RV function, mild  MR - LHC (3/22): No significant CAD.  2. HTN 3. Hypothyroidism 4. Obesity.  5. Goiter 6. Atrial flutter: 3/22, s/p DCCV.   SH:  Nonsmoker.  Rare ETOH.  No drugs.    FH: Mother, grandmother with HTN.  Uncle with MI. 2 sons with Duchenne muscular dystrophy (both have passed away).  Brother with Duchenne muscular dystrophy.  ROS: All systems reviewed and negative except as per HPI.   Current Outpatient Medications  Medication Sig Dispense Refill  . apixaban (ELIQUIS) 5 MG TABS tablet Take 1 tablet (5 mg total) by mouth 2 (two) times daily. 60 tablet 6  . empagliflozin (JARDIANCE) 10 MG TABS tablet Take 1 tablet (10 mg total) by mouth daily. 30 tablet 6  . furosemide (LASIX) 40 MG tablet Take 1 tablet (40 mg total) by mouth daily as needed for fluid or edema. 30 tablet 1  . metoprolol succinate (TOPROL XL) 100 MG 24 hr tablet Take 1 tablet (100 mg total) by mouth daily. Take with or immediately following a meal. 30 tablet 11  . polyvinyl alcohol (LIQUIFILM TEARS) 1.4 % ophthalmic solution Place 1 drop into both eyes 3 (three) times daily.    . sacubitril-valsartan (ENTRESTO) 49-51 MG Take 1 tablet by mouth 2 (two) times daily. 60 tablet 1  . spironolactone (ALDACTONE) 25 MG tablet Take 0.5 tablets (12.5 mg total) by mouth daily. 45 tablet  3   No current facility-administered medications for this encounter.   BP 112/70   Pulse (!) 50   Wt 125.2 kg (276 lb)   SpO2 100%   BMI 41.97 kg/m  General: NAD Neck: No JVD, no thyromegaly or thyroid nodule.  Lungs: Clear to auscultation bilaterally with normal respiratory effort. CV: Nondisplaced PMI.  Heart regular S1/S2, no S3/S4, no murmur.  No peripheral edema.  No carotid bruit.  Normal pedal pulses.  Abdomen: Soft, nontender, no hepatosplenomegaly, no distention.  Skin: Intact without lesions or rashes.  Neurologic: Alert and oriented x 3.  Psych: Normal affect. Extremities: No clubbing or cyanosis.  HEENT: Normal.    Assessment/Plan: 1. Chronic systolic CHF: Nonischemic cardiomyopathy. No CAD on coronary angiography in 2011 and 3/22.  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  EF improved in the past with BP control. However, she had 2 sons and a brother with Duchenne's muscular dystrophy.  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  TEE in 3/22 showed EF 20-25% with severe RV dysfunction.  She is not volume overloaded on exam.  NYHA class I-II symptoms. - She is taking Lasix 40 mg every other day.   - Continue Toprol XL 100 mg daily.  - Continue Entresto 49/51 bid.  - Add spironolactone 12.5 mg daily.  Will need to follow K closely, check BMET today and in 10 days.  - Continue Jardiance 10 mg daily.   - I will arrange for cardiac MRI, can see delayed enhancement with DMD-related cardiomopathy.  - I will send [employer] gene testing to see if she is a carrier for DMD. I am going to refer her for genetics counseling and evaluation with Dr. Lattie Corns. 2. HTN: Now controlled.    3. H/o hypothyroidism: No thyroid replacement. Following with endocrinology for goiter. 4. Obesity: We discussed diet and exercise for weight loss again.   5. Atrial flutter: Typical flutter in 3/22 with CHF decompensation.  Now s/p DCCV back to NSR and in NSR today.  - Would like more permanent treatment for flutter, will refer to EP for ablation.  - She will stay on apixaban for now, may be able to stop if she has flutter ablation.   Followup in 3 wks with clinical pharmacist for med titration, see me in 2 months.   Loralie Champagne 11/15/2020

## 2020-11-15 NOTE — Patient Instructions (Addendum)
Labs done today. We will contact you only if your labs are abnormal.  Genetic test has been done, this has to be sent to [place] to be processed and can take 1-2 weeks to get results back.  We will let you know the results.  START Spironolactone 12.5mg  (1/2 tablet) by mouth daily.  No other medication changes were made. Please continue all current medications as prescribed.  You have been referred to [facility]. Cardiac electrophysiology. They will contact you to schedule an appointment.  You have been referred to Dr. Broadus John with gentic cardiology. They will contact you to schedule an appointment.  Your physician has requested that you have a cardiac MRI. Cardiac MRI uses a computer to create images of your heart as its beating, producing both still and moving pictures of your heart and major blood vessels. For further information please visit [email or web address] This has to be approved through your insurance company prior to scheduling, once approved we will contact you to schedule an appointment.  Your physician recommends that you schedule a follow-up appointment in: 10 days for a lab only appointment, in 3 weeks with our Clinic Pharmacist and in 2 months with Dr. Aundra Dubin.  If you have any questions or concerns before your next appointment please send [place] a message through [place] or call our office at [phone].    TO LEAVE A MESSAGE FOR THE NURSE SELECT OPTION 2, PLEASE LEAVE A MESSAGE INCLUDING: . YOUR NAME . DATE OF BIRTH . CALL BACK NUMBER . REASON FOR CALL**this is important as we prioritize the call backs  YOU WILL RECEIVE A CALL BACK THE SAME DAY AS LONG AS YOU CALL BEFORE 4:00 PM   Do the following things EVERYDAY: 1) Weigh yourself in the morning before breakfast. Write it down and keep it in a log. 2) Take your medicines as prescribed 3) Eat low salt foods--Limit salt (sodium) to 2000 mg per day.  4) Stay as active as you can everyday 5) Limit all fluids for the  day to less than 2 liters   At the [facility], you and your health needs are our priority. As part of our continuing mission to provide you with exceptional heart care, we have created designated Provider Care Teams. These Care Teams include your primary Cardiologist (physician) and Advanced Practice Providers (APPs- Physician Assistants and Nurse Practitioners) who all work together to provide you with the care you need, when you need it.   You may see any of the following providers on your designated Care Team at your next follow up: Marland Kitchen Dr Glori Bickers . Dr Loralie Champagne . Darrick Grinder, NP . Lyda Jester, PA . Audry Riles, PharmD   Please be sure to bring in all your medications bottles to every appointment.

## 2020-11-15 NOTE — Progress Notes (Incomplete)
Patient ID: Suzanne Long, female   DOB: 1971-04-22, 50 y.o.   MRN: [id number] PCP: Lennie Odor HF Cardiology: Dr. Aundra Dubin  50 y.o. with history of HTN and nonischemic cardiomyopathy presents for followup of CHF.      She was diagnosed with CHF initially in 2011.  At that time, she was hospitalized with volume overload.  Coronary angiography in 2011 showed no significant CAD, but EF was about 25%.  BP was very high.  She was started on cardiac meds and BP improved.  Per her report, EF improved.  She eventually stopped all her medications.  She was then hospitalized in 11/16 with CHF exacerbation. EF was 30-35% on 11/16 echo and 30-35% on 2/17 echo.   Echo in 7/17 showed EF up to 40%.   She stopped her medications again.  She was then admitted in 3/22 with atrial flutter/RVR and CHF.  Unsure how long atrial flutter was present as she did not feel palpitations.  She had TEE-guided DCCV.  TEE showed EF 20-25% with severely decreased RV function, mild MR.  LHC in 3/22 showed no significant CAD.    Patient returns for followup of CHF.  She is in NSR today.  No palpitations.  She is walking 3-4 miles/day without dyspnea.  No dyspnea walking up a flight of stairs.  No lightheadedness.  She has chronic leg weakness, she struggles to go from squatting to standing.  This is chronic.  Weight is stable.    Labs (3/17): K 3.7, creatinine 0.84, SPEP negative, TSH normal.  Labs (5/17): K 3.9, creatinine 0.81, BNP normal Labs (3/22): K 5.2, creatinine 1.0  ECG (personally reviewed): NSR at 50, septal Qs, lateral TWIs  PMH: 1. Chronic systolic CHF: 1st noted in 2011.  Nonischemic cardiomyopathy.  - LHC (2011) with no significant CAD, EF 25%.   - Echo (11/16) with EF 30-35%, diffuse hypokinesis. - Echo (2/17) with EF 30-35%, moderate LVH, diffuse hypokinesis.  - Echo (7/17) with EF 40%, severe LV dilation, grade II diastolic dysfunction.  - TEE (3/22) with EF 20-25% with severely decreased RV function, mild  MR - LHC (3/22): No significant CAD.  2. HTN 3. Hypothyroidism 4. Obesity.  5. Goiter 6. Atrial flutter: 3/22, s/p DCCV.   SH:  Nonsmoker.  Rare ETOH.  No drugs.    FH: Mother, grandmother with HTN.  Uncle with MI. 2 sons with Duchenne muscular dystrophy (both have passed away).  Brother with Duchenne muscular dystrophy.  ROS: All systems reviewed and negative except as per HPI.   Current Outpatient Medications  Medication Sig Dispense Refill  . apixaban (ELIQUIS) 5 MG TABS tablet Take 1 tablet (5 mg total) by mouth 2 (two) times daily. 60 tablet 6  . empagliflozin (JARDIANCE) 10 MG TABS tablet Take 1 tablet (10 mg total) by mouth daily. 30 tablet 6  . furosemide (LASIX) 40 MG tablet Take 1 tablet (40 mg total) by mouth daily as needed for fluid or edema. 30 tablet 1  . metoprolol succinate (TOPROL XL) 100 MG 24 hr tablet Take 1 tablet (100 mg total) by mouth daily. Take with or immediately following a meal. 30 tablet 11  . polyvinyl alcohol (LIQUIFILM TEARS) 1.4 % ophthalmic solution Place 1 drop into both eyes 3 (three) times daily.    . sacubitril-valsartan (ENTRESTO) 49-51 MG Take 1 tablet by mouth 2 (two) times daily. 60 tablet 1  . spironolactone (ALDACTONE) 25 MG tablet Take 0.5 tablets (12.5 mg total) by mouth daily. 45 tablet  3   No current facility-administered medications for this encounter.   BP 112/70   Pulse (!) 50   Wt 125.2 kg (276 lb)   SpO2 100%   BMI 41.97 kg/m  General: NAD Neck: No JVD, no thyromegaly or thyroid nodule.  Lungs: Clear to auscultation bilaterally with normal respiratory effort. CV: Nondisplaced PMI.  Heart regular S1/S2, no S3/S4, no murmur.  No peripheral edema.  No carotid bruit.  Normal pedal pulses.  Abdomen: Soft, nontender, no hepatosplenomegaly, no distention.  Skin: Intact without lesions or rashes.  Neurologic: Alert and oriented x 3.  Psych: Normal affect. Extremities: No clubbing or cyanosis.  HEENT: Normal.    Assessment/Plan: 1. Chronic systolic CHF: Nonischemic cardiomyopathy. No CAD on coronary angiography in 2011 and 3/22.  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  EF improved in the past with BP control. However, she had 2 sons and a brother with Duchenne's muscular dystrophy.  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  She is not volume overloaded on exam.  NYHA class I-II symptoms. - Continue current Lasix.  BMET today.  - Continue current Coreg, spironolactone, and Entresto.  - I do not think that she has BP room to increase Entresto or add Bidil.   - We talked about adding Bidil but BP is soft.  She will check BP daily x 2 weeks and call in readings.  - EF is outside the range for ICD.  2. HTN: Now controlled.    3. H/o hypothyroidism: No thyroid replacement. Following with endocrinology for goiter. 4. Obesity: We discussed diet and exercise for weight loss again.    Followup in 6 months.   Loralie Champagne 11/15/2020

## 2020-11-19 ENCOUNTER — Encounter (HOSPITAL_COMMUNITY): Payer: Self-pay

## 2020-11-19 ENCOUNTER — Other Ambulatory Visit (HOSPITAL_COMMUNITY): Payer: Self-pay | Admitting: *Deleted

## 2020-11-19 ENCOUNTER — Other Ambulatory Visit: Payer: Self-pay | Admitting: Interventional Cardiology

## 2020-11-19 MED ORDER — ENTRESTO 49-51 MG PO TABS: 1.0000 | ORAL_TABLET | Freq: Two times a day (BID) | ORAL | 1 refills | Status: DC

## 2020-11-26 ENCOUNTER — Ambulatory Visit (HOSPITAL_COMMUNITY)
Admission: RE | Admit: 2020-11-26 | Discharge: 2020-11-26 | Disposition: A | Discharge status: Home / Self Care | Source: Ambulatory Visit | Attending: Cardiology | Admitting: Cardiology

## 2020-11-26 ENCOUNTER — Other Ambulatory Visit: Payer: Self-pay

## 2020-11-26 DIAGNOSIS — I5022 Chronic systolic (congestive) heart failure: Secondary | ICD-10-CM | POA: Diagnosis present

## 2020-11-26 LAB — BASIC METABOLIC PANEL
Anion gap: 6 (ref 5–15)
BUN: 8 mg/dL (ref 6–20)
CO2: 29 mmol/L (ref 22–32)
Calcium: 9.1 mg/dL (ref 8.9–10.3)
Chloride: 105 mmol/L (ref 98–111)
Creatinine, Ser: 1.05 mg/dL — ABNORMAL HIGH (ref 0.44–1.00)
GFR, Estimated: >60 mL/min (ref >60)
Glucose, Bld: 97 mg/dL (ref 70–99)
Potassium: 4.3 mmol/L (ref 3.5–5.1)
Sodium: 140 mmol/L (ref 135–145)

## 2020-11-29 ENCOUNTER — Other Ambulatory Visit: Payer: Self-pay

## 2020-11-29 ENCOUNTER — Ambulatory Visit (INDEPENDENT_AMBULATORY_CARE_PROVIDER_SITE_OTHER): Admitting: Cardiology

## 2020-11-29 ENCOUNTER — Encounter: Payer: Self-pay | Admitting: Cardiology

## 2020-11-29 VITALS — BP 120/80 | HR 52 | Ht 68.5 in | Wt 273.0 lb

## 2020-11-29 DIAGNOSIS — Z01810 Encounter for preprocedural cardiovascular examination: Secondary | ICD-10-CM

## 2020-11-29 DIAGNOSIS — I5022 Chronic systolic (congestive) heart failure: Secondary | ICD-10-CM | POA: Diagnosis not present

## 2020-11-29 DIAGNOSIS — I483 Typical atrial flutter: Secondary | ICD-10-CM | POA: Diagnosis not present

## 2020-11-29 NOTE — Progress Notes (Signed)
Electrophysiology Office Note   Date:  11/29/2020   ID:  Suzanne Long, DOB 1970-08-16, MRN [id number]  PCP:  Milus Height, PA  Cardiologist:  Shirlee Latch Primary Electrophysiologist:  Isidoro Santillana [name], MD    Chief Complaint: Atrial flutter   History of Present Illness: Suzanne Long is a 50 y.o. female who is being seen today for the evaluation of atrial flutter at the request of Laurey Morale, MD. Presenting today for electrophysiology evaluation.  She has a history significant for chronic systolic heart failure due to nonischemic cardiomyopathy, hypertension, obesity, and atrial flutter.  She presented to the hospital 09/25/2020 in atrial flutter.  She was found to have a reduced ejection fraction of 20 to 25%.  She was started on Eliquis and had a TEE cardioversion.  She was also started on optimal heart failure medications.  Since then she has done well and has noted no further episodes of atrial flutter.  She feels quite a bit improved.  She had a lot of shortness of breath, weakness, and fatigue when she came into the hospital.  Today, she denies symptoms of palpitations, chest pain, shortness of breath, orthopnea, PND, lower extremity edema, claudication, dizziness, presyncope, syncope, bleeding, or neurologic sequela. The patient is tolerating medications without difficulties.    Past Medical History:  Diagnosis Date  . CHF (congestive heart failure) (HCC)   . Dyspnea   . Hypertension   . Hypothyroidism   . Joint pain   . Multiple food allergies   . NICM (nonischemic cardiomyopathy) (HCC)    a. Echo 2/17:  moderate LVH, EF 30-35%, diffuse HK, moderate LAE  . NICM (nonischemic cardiomyopathy) (HCC) 09/26/2020  . Non-ST elevation (NSTEMI) myocardial infarction [facility]) 09/26/2020   Past Surgical History:  Procedure Laterality Date  . BUBBLE STUDY  09/26/2020   Procedure: BUBBLE STUDY;  Surgeon: Meriam Sprague, MD;  Location: [facility] ENDOSCOPY;  Service: Cardiovascular;;  .  CARDIOVERSION N/A 09/26/2020   Procedure: CARDIOVERSION;  Surgeon: Meriam Sprague, MD;  Location: [facility] ENDOSCOPY;  Service: Cardiovascular;  Laterality: N/A;  . LEFT HEART CATH AND CORONARY ANGIOGRAPHY N/A 09/24/2020   Procedure: LEFT HEART CATH AND CORONARY ANGIOGRAPHY;  Surgeon: Runell Gess, MD;  Location: [facility] INVASIVE CV LAB;  Service: Cardiovascular;  Laterality: N/A;  . MYOMECTOMY    . None    . TEE WITHOUT CARDIOVERSION N/A 09/26/2020   Procedure: TRANSESOPHAGEAL ECHOCARDIOGRAM (TEE);  Surgeon: Meriam Sprague, MD;  Location: [facility] ENDOSCOPY;  Service: Cardiovascular;  Laterality: N/A;     Current Outpatient Medications  Medication Sig Dispense Refill  . apixaban (ELIQUIS) 5 MG TABS tablet Take 1 tablet (5 mg total) by mouth 2 (two) times daily. 60 tablet 6  . empagliflozin (JARDIANCE) 10 MG TABS tablet Take 1 tablet (10 mg total) by mouth daily. 30 tablet 6  . furosemide (LASIX) 40 MG tablet Take 1 tablet (40 mg total) by mouth daily as needed for fluid or edema. 30 tablet 1  . metoprolol succinate (TOPROL XL) 100 MG 24 hr tablet Take 1 tablet (100 mg total) by mouth daily. Take with or immediately following a meal. 30 tablet 11  . polyvinyl alcohol (LIQUIFILM TEARS) 1.4 % ophthalmic solution Place 1 drop into both eyes 3 (three) times daily.    . sacubitril-valsartan (ENTRESTO) 49-51 MG Take 1 tablet by mouth 2 (two) times daily. 60 tablet 1  . spironolactone (ALDACTONE) 25 MG tablet Take 0.5 tablets (12.5 mg total) by mouth daily. 45 tablet 3  No current facility-administered medications for this visit.    Allergies:   Shellfish allergy, Gadolinium derivatives, Iodine, and Latex   Social History:  The patient  reports that she has never smoked. She has never used smokeless tobacco. She reports current alcohol use. She reports that she does not use drugs.   Family History:  The patient's family history includes Depression in her mother; Diabetes in her mother;  Hypertension in her mother; Kidney failure in her brother; Muscular dystrophy in her brother and son; Obesity in her mother.    ROS:  Please see the history of present illness.   Otherwise, review of systems is positive for none.   All other systems are reviewed and negative.    PHYSICAL EXAM: VS:  BP 120/80 (BP Location: Left Arm, Patient Position: Sitting, Cuff Size: Large)   Pulse (!) 52   Ht 5' 8.5" (1.74 m)   Wt 273 lb (123.8 kg)   SpO2 96%   BMI 40.91 kg/m  , BMI Body mass index is 40.91 kg/m. GEN: Well nourished, well developed, in no acute distress  HEENT: normal  Neck: no JVD, carotid bruits, or masses Cardiac: RRR; no murmurs, rubs, or gallops,no edema  Respiratory:  clear to auscultation bilaterally, normal work of breathing GI: soft, nontender, nondistended, + BS MS: no deformity or atrophy  Skin: warm and dry Neuro:  Strength and sensation are intact Psych: euthymic mood, full affect  EKG:  EKG is ordered today. Personal review of the ekg ordered shows sinus rhythm  Recent Labs: 09/22/2020: B Natriuretic Peptide 872.5; Magnesium 1.9; TSH 4.445 09/26/2020: Hemoglobin 16.3; Platelets 285 11/26/2020: BUN 8; Creatinine, Ser 1.05; Potassium 4.3; Sodium 140    Lipid Panel     Component Value Date/Time   CHOL 170 09/23/2020 0433   CHOL 175 05/31/2019 1140   TRIG 80 09/23/2020 0433   HDL 45 09/23/2020 0433   HDL 51 05/31/2019 1140   CHOLHDL 3.8 09/23/2020 0433   VLDL 16 09/23/2020 0433   LDLCALC 109 (H) 09/23/2020 0433   LDLCALC 112 (H) 05/31/2019 1140     Wt Readings from Last 3 Encounters:  11/29/20 273 lb (123.8 kg)  11/15/20 276 lb (125.2 kg)  11/06/20 271 lb (122.9 kg)      Other studies Reviewed: Additional studies/ records that were reviewed today include: Left heart catheterization 09/24/2020 Review of the above records today demonstrates:   There is severe left ventricular systolic dysfunction.  LV end diastolic pressure is mildly  elevated.  The left ventricular ejection fraction is less than 25% by visual estimate.  TEE 09/26/2020 1. Left ventricular ejection fraction, by estimation, is 20 to 25%. The  left ventricle has severely decreased function. The left ventricle  demonstrates global hypokinesis. The left ventricular internal cavity size  was mildly dilated.  2. Right ventricular systolic function is severely reduced. The right  ventricular size is normal.  3. Left atrial size was severely dilated. No left atrial/left atrial  appendage thrombus was detected.  4. Right atrial size was mildly dilated.  5. The pericardial effusion is localized near the right ventricle.  6. The mitral valve is normal in structure. Mild mitral valve  regurgitation.  7. The aortic valve is tricuspid. There is mild calcification of the  aortic valve. There is mild thickening of the aortic valve. Aortic valve  regurgitation is not visualized. No aortic stenosis is present.  8. There is mild (Grade II) plaque.  9. Agitated saline contrast bubble study was  negative, with no evidence  of any interatrial shunt.  10. There is smoke seen throughout both atria consistent with a low flow  state. No evidence of thrombus.  ASSESSMENT AND PLAN:  1.  Typical appearing atrial flutter: Currently on Eliquis.  CHA2DS2-VASc of 2.  At this point she would like to stay in normal rhythm and get off the Eliquis.  Due to that, we Malayzia Laforte plan for ablation.  Risk and benefits of been discussed and include bleeding, tamponade, heart block, stroke, among others.  She understands these risks and is agreed to the procedure.    2.  Chronic systolic heart failure due to nonischemic cardiomyopathy: Currently on Lasix, Toprol-XL, Entresto, Aldactone, Jardiance.  Plan per primary cardiology.  She stays in normal rhythm, ICD may be indicated, but would continue to hold off for now.  Currently undergoing possible testing for muscular dystrophy.  Case  discussed with primary cardiologist  Current medicines are reviewed at length with the patient today.   The patient does not have concerns regarding her medicines.  The following changes were made today:  none  Labs/ tests ordered today include:  Orders Placed This Encounter  Procedures  . Basic metabolic panel  . CBC  . EKG 12-Lead     Disposition:   FU with Kailani Brass 3 months  Signed, Calub Tarnow Meredith Leeds, MD  11/29/2020 2:08 PM     [place] [address] [place] [address] [phone] (office) [phone] (fax)

## 2020-11-29 NOTE — Patient Instructions (Signed)
Medication Instructions:  Your physician recommends that you continue on your current medications as directed. Please refer to the Current Medication list given to you today.  *If you need a refill on your cardiac medications before your next appointment, please call your pharmacy*   Lab Work: Pre procedure labs:  BMP & CBC January 15, 2021  If you have labs (blood work) drawn today and your tests are completely normal, you will receive your results only by: Marland Kitchen MyChart Message (if you have MyChart) OR . A paper copy in the mail If you have any lab test that is abnormal or we need to change your treatment, we will call you to review the results.   Your physician has recommended that you have an ablation. Catheter ablation is a medical procedure used to treat some cardiac arrhythmias (irregular heartbeats). During catheter ablation, a long, thin, flexible tube is put into a blood vessel in your groin (upper thigh), or neck. This tube is called an ablation catheter. It is then guided to your heart through the blood vessel. Radio frequency waves destroy small areas of heart tissue where abnormal heartbeats may cause an arrhythmia to start. Please follow instruction below located under "other instructions".   Follow-Up: At [facility], you and your health needs are our priority.  As part of our continuing mission to provide you with exceptional heart care, we have created designated Provider Care Teams.  These Care Teams include your primary Cardiologist (physician) and Advanced Practice Providers (APPs -  Physician Assistants and Nurse Practitioners) who all work together to provide you with the care you need, when you need it.  We recommend signing up for the patient portal called "MyChart".  Sign up information is provided on this After Visit Summary.  MyChart is used to connect with patients for Virtual Visits (Telemedicine).  Patients are able to view lab/test results, encounter notes, upcoming  appointments, etc.  Non-urgent messages can be sent to your provider as well.   To learn more about what you can do with MyChart, go to [email or web address].    Your next appointment:   1 month(s) after your ablation  The format for your next appointment:   In Person  Provider:   AFib clinic   Thank you for choosing [facility]!!   Trinidad Curet, RN [phone]    Other Instructions  *Please note that any paperwork needing to be filled out by the provider will need to be addressed at the front desk prior to seeing the provider. Please note that any FMLA, disability or other documents regarding health condition is subject to a $25.00 charge that must be received prior to completion of paperwork in the form of a money order or check.     Electrophysiology/Ablation Procedure Instructions   You are scheduled for an Atrial Flutter ablation on January 22, 2021 with Dr. Allegra Lai.   1.   Pre procedure testing-             A.  LAB WORK --- On January 15, 2021 you will first go to the [facility] office (see address at the top of this letter) at anytime between 7:30 am and 4:30 pm for your pre procedure blood work.                  2. On the day of your procedure January 22, 2021  you will go to [facility] [phone]) at 8:30 am. Dennis Bast will go to  the main entrance A [employer]) and enter where the [employer] parking staff are.  Your driver will drop you off and you will head down the hallway to ADMITTING.  You may have one support person come in to the hospital with you.  They will be asked to wait in the waiting room. It is OK to have someone drop you off and come back when you are ready to be discharged.   3.   Do not eat or drink after midnight prior to your procedure.   4.   On the morning of your procedure do NOT take any medication. Do not miss any doses of your blood thinner prior to the morning of your procedure or your procedure will need to be rescheduled.   5.   Plan for an overnight stay but you may be discharged after your procedure, if you use your phone frequently bring your phone charger. If you are discharged after your procedure you will need someone to drive you home and be with you for 24 hours after your procedure.   6. You will follow up with the AFIB clinic 4 weeks after your procedure.  You will follow up with Dr. Curt Bears  3 months after your procedure.  These appointments will be made for you.   * If you have ANY questions please call the office [phone] and ask for Sherri RN or send me a MyChart message   * Occasionally, EP Studies and ablations can become lengthy.  Please make your family aware of this before your procedure starts.  Average time ranges from 2-8 hours for EP studies/ablations.  Your physician will call your family after the procedure with the results.

## 2020-12-11 ENCOUNTER — Other Ambulatory Visit: Payer: Self-pay

## 2020-12-11 MED ORDER — METOPROLOL SUCCINATE ER 100 MG PO TB24: 100.0000 mg | ORAL_TABLET | Freq: Every day | ORAL | 3 refills | Status: DC

## 2020-12-11 MED ORDER — EMPAGLIFLOZIN 10 MG PO TABS: 10.0000 mg | ORAL_TABLET | Freq: Every day | ORAL | 3 refills | Status: DC

## 2020-12-11 MED ORDER — APIXABAN 5 MG PO TABS: 5.0000 mg | ORAL_TABLET | Freq: Two times a day (BID) | ORAL | 1 refills | Status: DC

## 2020-12-11 NOTE — Telephone Encounter (Signed)
Prescription refill request for Eliquis received. Indication:aflutter Last office visit:11/29/20 Scr:1.05 Age: 50 Weight:123.8

## 2020-12-27 ENCOUNTER — Other Ambulatory Visit: Payer: Self-pay

## 2020-12-27 ENCOUNTER — Ambulatory Visit (HOSPITAL_COMMUNITY)
Admission: RE | Admit: 2020-12-27 | Discharge: 2020-12-27 | Disposition: A | Discharge status: Home / Self Care | Source: Ambulatory Visit | Attending: Internal Medicine | Admitting: Internal Medicine

## 2020-12-27 ENCOUNTER — Encounter (HOSPITAL_COMMUNITY): Payer: Self-pay

## 2020-12-27 DIAGNOSIS — E669 Obesity, unspecified: Secondary | ICD-10-CM | POA: Diagnosis not present

## 2020-12-27 DIAGNOSIS — I110 Hypertensive heart disease with heart failure: Secondary | ICD-10-CM | POA: Insufficient documentation

## 2020-12-27 DIAGNOSIS — I483 Typical atrial flutter: Secondary | ICD-10-CM | POA: Diagnosis not present

## 2020-12-27 DIAGNOSIS — I5022 Chronic systolic (congestive) heart failure: Secondary | ICD-10-CM | POA: Insufficient documentation

## 2020-12-27 DIAGNOSIS — Z955 Presence of coronary angioplasty implant and graft: Secondary | ICD-10-CM | POA: Insufficient documentation

## 2020-12-27 DIAGNOSIS — Z79899 Other long term (current) drug therapy: Secondary | ICD-10-CM | POA: Diagnosis not present

## 2020-12-27 DIAGNOSIS — I428 Other cardiomyopathies: Secondary | ICD-10-CM | POA: Insufficient documentation

## 2020-12-27 DIAGNOSIS — Z713 Dietary counseling and surveillance: Secondary | ICD-10-CM | POA: Insufficient documentation

## 2020-12-27 DIAGNOSIS — Z7901 Long term (current) use of anticoagulants: Secondary | ICD-10-CM | POA: Diagnosis not present

## 2020-12-27 DIAGNOSIS — E039 Hypothyroidism, unspecified: Secondary | ICD-10-CM | POA: Insufficient documentation

## 2020-12-27 MED ORDER — SPIRONOLACTONE 25 MG PO TABS: 25.0000 mg | ORAL_TABLET | Freq: Every day | ORAL | 3 refills | Status: DC

## 2020-12-27 MED ORDER — FUROSEMIDE 40 MG PO TABS: 40.0000 mg | ORAL_TABLET | ORAL | 1 refills | Status: DC

## 2020-12-27 NOTE — Patient Instructions (Signed)
It was a pleasure seeing you today!  MEDICATIONS: -We are changing your medications today! -Increase spironolactone to 25 mg once daily. -Reduce furosemide to 40 mg (1 tablet) every other day.  -Call if you have questions about your medications.  NEXT APPOINTMENT: Return to clinic in 1 week for labs. Return to clinic on January 23, 2021 for a visit with Dr. Aundra Dubin.   In general, to take care of your heart failure: -Limit your fluid intake to 2 Liters (half-gallon) per day.   -Limit your salt intake to ideally 2-3 grams (2000-3000 mg) per day. -Weigh yourself daily and record, and bring that "weight diary" to your next appointment.  (Weight gain of 2-3 pounds in 1 day typically means fluid weight.) -The medications for your heart are to help your heart and help you live longer.   -Please contact us before stopping any of your heart medications.  Call the clinic at [phone] with questions or to reschedule future appointments.

## 2020-12-27 NOTE — Progress Notes (Signed)
PCP:  Lennie Odor, PA     Cardiologist:  Aundra Dubin Primary Electrophysiologist:  Constance Haw, MD      HPI:  50 y.o. with history of HTN and nonischemic cardiomyopathy presents for followup of CHF.       Suzanne Long was diagnosed with CHF initially in 2011.  At that time, Suzanne Long was hospitalized with volume overload.  Coronary angiography in 2011 showed no significant CAD, but EF was about 25%.  BP was very high.  Suzanne Long was started on cardiac meds and BP improved.  Per her report, EF improved.  Suzanne Long eventually stopped all her medications.  Suzanne Long was then hospitalized in 11/16 with CHF exacerbation. EF was 30-35% on 11/16 echo and 30-35% on 2/17 echo.   Echo in 7/17 showed EF up to 40%.   Suzanne Long stopped her medications again.  Suzanne Long was then admitted in 3/22 with atrial flutter/RVR and CHF.  Unsure how long atrial flutter was present as Suzanne Long did not feel palpitations.  Suzanne Long had TEE-guided DCCV.  TEE showed EF 20-25% with severely decreased RV function, mild MR.  LHC in 3/22 showed no significant CAD.     Last seen by Dr. Aundra Dubin on 11/15/20 - BP was 112/70 w/ HR 50 - was in NSR.  No palpitations.  Walking 3-4 miles/day without dyspnea.  No dyspnea walking up a flight of stairs.  No lightheadedness.  Suzanne Long has chronic leg weakness, Suzanne Long struggles to go from squatting to standing.  This is chronic.  Weight is stable.    Seen by Dr. Curt Bears on 11/29/20. At this visit, patient requested to be taken off Eliquis. Due to this, Suzanne Long is scheduled for a-flutter ablation on 01/22/21. Dr. Curt Bears noted that Suzanne Long may be a candidate for ICD, holding off for now.   Today Suzanne Long returns to HF clinic for pharmacist medication titration. At last visit with Dr. Aundra Dubin, spironolactone 12.5 mg was added. Labs stable. Overall feeling well today. Admits Suzanne Long has not taken medication yet this morning. Denies dizziness, fatigue, chest pain, shortness of breath. Weighs self daily, consistent between 269-272 lbs. Currently taking furosemide 2 tablets of 40  mg (80 mg total) every other day. Appears euvolemic. Does not regularly take BP at home, Suzanne Long states Suzanne Long took it ~2 weeks ago and it was 126/74 mmHg.    HF Medications: Metoprolol succinate 100 mg daily Entresto 49-51 mg BID Spironolactone 12.5 mg daily Jardiance 10 mg daily Furosemide 80 mg every other day   Has the patient been experiencing any side effects to the medications prescribed?  no  Does the patient have any problems obtaining medications due to transportation or finances?   No - has [employer] of regimen: good Understanding of indications: good Potential of compliance: fair Patient understands to avoid NSAIDs. Patient understands to avoid decongestants.    Pertinent Lab Values: 11/26/20: Serum creatinine 1.05, BUN 8, Potassium 4.3, Sodium 140 11/15/20: Serum creatinine 0.89, BUN 10, Potassium 4.5, Sodium 139  Vital Signs: Weight: 125 kg (last clinic weight: 125 kg) Blood pressure: 130/82 mmHg (without taking medications this AM)  Heart rate: 54 bpm  Assessment/Plan: 1. Chronic systolic CHF: Nonischemic cardiomyopathy. No CAD on coronary angiography in 2011 and 3/22.  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  EF improved in the past with BP control. However, Suzanne Long had 2 sons and a brother with Duchenne's muscular dystrophy.  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  TEE in 3/22 showed EF 20-25% with severe RV dysfunction. Unable to see true BP with effects of medications due to her not taking any medications yet this morning.  - Continue metoprolol succinate 100 mg daily - Continue Entresto 49/51 mg BID - Increase spironolactone to 25 mg once daily; no symptoms of orthostasis at home, Suzanne Long will likely tolerate target dose fine. Repeat BMET in 1 week.  - Reduce furosemide to 40 mg every other day. The  increase in spironolactone will allow for further diuresis benefits. Euvolemic on exam today - Continue Jardiance 10 mg daily - Dr. Aundra Dubin sent [employer] gene testing to see if Suzanne Long is a carrier for DMD. He has referred her for genetics counseling and evaluation with Dr. Lattie Corns. - Encouraged her to take her medications prior to coming to clinic next visit. 2. HTN: Now controlled.    3. H/o hypothyroidism: No thyroid replacement. Following with endocrinology for goiter. 4. Obesity: We discussed diet and exercise for weight loss again. Patient inquired about GoLo weight loss supplement - encouraged her to not take supplements for weight loss. 5. Atrial flutter: Typical flutter in 3/22 with CHF decompensation.  Now s/p DCCV back to NSR and in NSR today. - Would like more permanent treatment for flutter, ablation scheduled with Dr. Curt Bears on 01/23/21.  Follow up in 1 month with Dr. Joen Laura, PharmD PGY-1 [facility] Pharmacy Resident   Kerby Nora, PharmD, BCPS Advanced HF Clinic Pharmacist [phone]

## 2020-12-31 ENCOUNTER — Other Ambulatory Visit (HOSPITAL_COMMUNITY): Payer: Self-pay | Admitting: Unknown Physician Specialty

## 2020-12-31 ENCOUNTER — Other Ambulatory Visit (HOSPITAL_COMMUNITY): Payer: Self-pay | Admitting: Emergency Medicine

## 2020-12-31 ENCOUNTER — Telehealth (HOSPITAL_COMMUNITY): Payer: Self-pay | Admitting: Emergency Medicine

## 2020-12-31 DIAGNOSIS — I340 Nonrheumatic mitral (valve) insufficiency: Secondary | ICD-10-CM

## 2020-12-31 DIAGNOSIS — I428 Other cardiomyopathies: Secondary | ICD-10-CM

## 2020-12-31 MED ORDER — FUROSEMIDE 40 MG PO TABS: 40.0000 mg | ORAL_TABLET | ORAL | 6 refills | Status: DC

## 2020-12-31 MED ORDER — SPIRONOLACTONE 25 MG PO TABS: 25.0000 mg | ORAL_TABLET | Freq: Every day | ORAL | 3 refills | Status: DC

## 2020-12-31 MED ORDER — ONDANSETRON 4 MG PO TBDP: 4.0000 mg | ORAL_TABLET | Freq: Once | ORAL | 0 refills | Status: AC

## 2020-12-31 NOTE — Telephone Encounter (Signed)
Attempted to call patient regarding upcoming cardiac MR appointment. Left message on voicemail with name and callback number Marchia Bond RN Navigator Cardiac Imaging [facility] [phone] Office [phone] Cell  Left message about one time dose zofran sent to her pharm on file Clarise Cruz

## 2020-12-31 NOTE — Progress Notes (Signed)
One time dose zofran ODT $RemoveBe'4mg'AnQrIsVcx$  prescribed for cardiac MRI scheduled wed 01/02/21.   Marchia Bond RN Navigator Cardiac Imaging [facility] [phone] Office  [phone] Cell

## 2021-01-01 ENCOUNTER — Telehealth (HOSPITAL_COMMUNITY): Payer: Self-pay | Admitting: *Deleted

## 2021-01-01 NOTE — Telephone Encounter (Signed)
Reaching out to patient to offer assistance regarding upcoming cardiac imaging study; pt verbalizes understanding of appt date/time, parking situation and where to check in, and medications ordered, name and call back number provided for further questions should they arise  Gordy Clement RN Navigator Cardiac [place] [facility] [phone] office [phone] cell

## 2021-01-02 ENCOUNTER — Ambulatory Visit (HOSPITAL_COMMUNITY)
Admission: RE | Admit: 2021-01-02 | Discharge: 2021-01-02 | Disposition: A | Discharge status: Home / Self Care | Source: Ambulatory Visit | Attending: Cardiology | Admitting: Cardiology

## 2021-01-02 ENCOUNTER — Other Ambulatory Visit: Payer: Self-pay

## 2021-01-02 DIAGNOSIS — I340 Nonrheumatic mitral (valve) insufficiency: Secondary | ICD-10-CM | POA: Insufficient documentation

## 2021-01-02 MED ORDER — GADOBUTROL 1 MMOL/ML IV SOLN
10.0000 mL | Freq: Once | INTRAVENOUS | Status: AC | PRN
  Administered 2021-01-02: 10 mL via INTRAVENOUS

## 2021-01-02 MED ORDER — LORAZEPAM 2 MG/ML IJ SOLN
INTRAMUSCULAR | Status: AC
  Administered 2021-01-02: 0.5 mg via INTRAVENOUS
  Filled 2021-01-02: qty 1

## 2021-01-02 MED ORDER — LORAZEPAM 2 MG/ML IJ SOLN
0.5000 mg | Freq: Once | INTRAMUSCULAR | Status: AC
  Filled 2021-01-02: qty 0.25

## 2021-01-03 ENCOUNTER — Other Ambulatory Visit (HOSPITAL_COMMUNITY): Payer: Self-pay | Admitting: *Deleted

## 2021-01-03 ENCOUNTER — Ambulatory Visit (HOSPITAL_COMMUNITY)
Admission: RE | Admit: 2021-01-03 | Discharge: 2021-01-03 | Disposition: A | Discharge status: Home / Self Care | Source: Ambulatory Visit | Attending: Internal Medicine | Admitting: Internal Medicine

## 2021-01-03 DIAGNOSIS — I5022 Chronic systolic (congestive) heart failure: Secondary | ICD-10-CM

## 2021-01-03 LAB — BASIC METABOLIC PANEL
Anion gap: 5 (ref 5–15)
BUN: 9 mg/dL (ref 6–20)
CO2: 28 mmol/L (ref 22–32)
Calcium: 9 mg/dL (ref 8.9–10.3)
Chloride: 104 mmol/L (ref 98–111)
Creatinine, Ser: 0.98 mg/dL (ref 0.44–1.00)
GFR, Estimated: >60 mL/min (ref >60)
Glucose, Bld: 90 mg/dL (ref 70–99)
Potassium: 4.6 mmol/L (ref 3.5–5.1)
Sodium: 137 mmol/L (ref 135–145)

## 2021-01-16 ENCOUNTER — Other Ambulatory Visit

## 2021-01-16 ENCOUNTER — Other Ambulatory Visit: Payer: Self-pay

## 2021-01-16 DIAGNOSIS — Z01810 Encounter for preprocedural cardiovascular examination: Secondary | ICD-10-CM

## 2021-01-16 DIAGNOSIS — I5022 Chronic systolic (congestive) heart failure: Secondary | ICD-10-CM

## 2021-01-16 DIAGNOSIS — I483 Typical atrial flutter: Secondary | ICD-10-CM

## 2021-01-16 LAB — BASIC METABOLIC PANEL
BUN/Creatinine Ratio: 12 (ref 9–23)
BUN: 11 mg/dL (ref 6–24)
CO2: 23 mmol/L (ref 20–29)
Calcium: 9.2 mg/dL (ref 8.7–10.2)
Chloride: 101 mmol/L (ref 96–106)
Creatinine, Ser: 0.95 mg/dL (ref 0.57–1.00)
Glucose: 84 mg/dL (ref 65–99)
Potassium: 4.5 mmol/L (ref 3.5–5.2)
Sodium: 138 mmol/L (ref 134–144)
eGFR: 73 mL/min/{1.73_m2} (ref >59)

## 2021-01-16 LAB — CBC
Hematocrit: 42.6 % (ref 34.0–46.6)
Hemoglobin: 14.2 g/dL (ref 11.1–15.9)
MCH: 29.6 pg (ref 26.6–33.0)
MCHC: 33.3 g/dL (ref 31.5–35.7)
MCV: 89 fL (ref 79–97)
Platelets: 261 10*3/uL (ref 150–450)
RBC: 4.79 x10E6/uL (ref 3.77–5.28)
RDW: 13.6 % (ref 11.7–15.4)
WBC: 5.2 10*3/uL (ref 3.4–10.8)

## 2021-01-21 NOTE — Pre-Procedure Instructions (Signed)
Instructed patient on the following items: Arrival time 0900 Nothing to eat or drink after midnight No meds AM of procedure Responsible person to drive you home and stay with you for 24 hrs  Have you missed any doses of anti-coagulant Eliquis- hasn't missed any doses

## 2021-01-22 ENCOUNTER — Encounter (HOSPITAL_COMMUNITY)
Admission: RE | Disposition: A | Discharge status: Home / Self Care | Payer: Self-pay | Source: Home / Self Care | Attending: Cardiology

## 2021-01-22 ENCOUNTER — Other Ambulatory Visit: Payer: Self-pay

## 2021-01-22 ENCOUNTER — Ambulatory Visit (HOSPITAL_COMMUNITY): Admitting: Certified Registered Nurse Anesthetist

## 2021-01-22 ENCOUNTER — Ambulatory Visit (HOSPITAL_COMMUNITY)
Admission: RE | Admit: 2021-01-22 | Discharge: 2021-01-22 | Disposition: A | Discharge status: Home / Self Care | Attending: Cardiology | Admitting: Cardiology

## 2021-01-22 DIAGNOSIS — I4892 Unspecified atrial flutter: Secondary | ICD-10-CM | POA: Diagnosis present

## 2021-01-22 DIAGNOSIS — Z91040 Latex allergy status: Secondary | ICD-10-CM | POA: Diagnosis not present

## 2021-01-22 DIAGNOSIS — Z888 Allergy status to other drugs, medicaments and biological substances status: Secondary | ICD-10-CM | POA: Diagnosis not present

## 2021-01-22 DIAGNOSIS — E669 Obesity, unspecified: Secondary | ICD-10-CM | POA: Insufficient documentation

## 2021-01-22 DIAGNOSIS — I110 Hypertensive heart disease with heart failure: Secondary | ICD-10-CM | POA: Diagnosis not present

## 2021-01-22 DIAGNOSIS — Z6841 Body Mass Index (BMI) 40.0 and over, adult: Secondary | ICD-10-CM | POA: Diagnosis not present

## 2021-01-22 DIAGNOSIS — I5022 Chronic systolic (congestive) heart failure: Secondary | ICD-10-CM | POA: Insufficient documentation

## 2021-01-22 DIAGNOSIS — Z91013 Allergy to seafood: Secondary | ICD-10-CM | POA: Insufficient documentation

## 2021-01-22 DIAGNOSIS — Z7901 Long term (current) use of anticoagulants: Secondary | ICD-10-CM | POA: Insufficient documentation

## 2021-01-22 DIAGNOSIS — I428 Other cardiomyopathies: Secondary | ICD-10-CM | POA: Insufficient documentation

## 2021-01-22 HISTORY — PX: A-FLUTTER ABLATION: EP1230

## 2021-01-22 LAB — PREGNANCY, URINE: Preg Test, Ur: NEGATIVE

## 2021-01-22 LAB — GLUCOSE, CAPILLARY: Glucose-Capillary: 96 mg/dL (ref 70–99)

## 2021-01-22 SURGERY — A-FLUTTER ABLATION
Anesthesia: General

## 2021-01-22 MED ORDER — ONDANSETRON HCL 4 MG/2ML IJ SOLN: 4.0000 mg | Freq: Four times a day (QID) | INTRAMUSCULAR | Status: DC | PRN

## 2021-01-22 MED ORDER — PROPOFOL 10 MG/ML IV BOLUS
INTRAVENOUS | Status: DC | PRN
  Administered 2021-01-22: 160 mg via INTRAVENOUS

## 2021-01-22 MED ORDER — ACETAMINOPHEN 325 MG PO TABS
650.0000 mg | ORAL_TABLET | ORAL | Status: DC | PRN
  Filled 2021-01-22: qty 2

## 2021-01-22 MED ORDER — HEPARIN (PORCINE) IN NACL 1000-0.9 UT/500ML-% IV SOLN
INTRAVENOUS | Status: DC | PRN
  Administered 2021-01-22: 500 mL

## 2021-01-22 MED ORDER — PHENYLEPHRINE HCL-NACL 10-0.9 MG/250ML-% IV SOLN
INTRAVENOUS | Status: DC | PRN
  Administered 2021-01-22: 30 ug/min via INTRAVENOUS

## 2021-01-22 MED ORDER — ONDANSETRON HCL 4 MG/2ML IJ SOLN
INTRAMUSCULAR | Status: DC | PRN
  Administered 2021-01-22: 4 mg via INTRAVENOUS

## 2021-01-22 MED ORDER — ROCURONIUM BROMIDE 10 MG/ML (PF) SYRINGE
PREFILLED_SYRINGE | INTRAVENOUS | Status: DC | PRN
  Administered 2021-01-22: 70 mg via INTRAVENOUS

## 2021-01-22 MED ORDER — SODIUM CHLORIDE 0.9 % IV SOLN: INTRAVENOUS | Status: DC

## 2021-01-22 MED ORDER — MIDAZOLAM HCL 5 MG/5ML IJ SOLN
INTRAMUSCULAR | Status: DC | PRN
  Administered 2021-01-22: 2 mg via INTRAVENOUS

## 2021-01-22 MED ORDER — ACETAMINOPHEN 500 MG PO TABS
1000.0000 mg | ORAL_TABLET | Freq: Once | ORAL | Status: AC
  Administered 2021-01-22: 1000 mg via ORAL
  Filled 2021-01-22 (×2): qty 2

## 2021-01-22 MED ORDER — SODIUM CHLORIDE 0.9% FLUSH: 3.0000 mL | INTRAVENOUS | Status: DC | PRN

## 2021-01-22 MED ORDER — SODIUM CHLORIDE 0.9% FLUSH: 3.0000 mL | Freq: Two times a day (BID) | INTRAVENOUS | Status: DC

## 2021-01-22 MED ORDER — FENTANYL CITRATE (PF) 250 MCG/5ML IJ SOLN
INTRAMUSCULAR | Status: DC | PRN
  Administered 2021-01-22: 100 ug via INTRAVENOUS

## 2021-01-22 MED ORDER — LIDOCAINE 2% (20 MG/ML) 5 ML SYRINGE
INTRAMUSCULAR | Status: DC | PRN
  Administered 2021-01-22: 100 mg via INTRAVENOUS

## 2021-01-22 MED ORDER — DEXAMETHASONE SODIUM PHOSPHATE 10 MG/ML IJ SOLN
INTRAMUSCULAR | Status: DC | PRN
  Administered 2021-01-22: 5 mg via INTRAVENOUS

## 2021-01-22 MED ORDER — SODIUM CHLORIDE 0.9 % IV SOLN: 250.0000 mL | INTRAVENOUS | Status: DC | PRN

## 2021-01-22 MED ORDER — SUGAMMADEX SODIUM 200 MG/2ML IV SOLN
INTRAVENOUS | Status: DC | PRN
  Administered 2021-01-22 (×3): 100 mg via INTRAVENOUS

## 2021-01-22 SURGICAL SUPPLY — 14 items
CATH EZ STEER NAV 8MM F-J CUR (ABLATOR) ×1 IMPLANT
CATH WEB BI DIR CSDF CRV REPRO (CATHETERS) ×1 IMPLANT
CLOSURE PERCLOSE PROSTYLE (VASCULAR PRODUCTS) ×2 IMPLANT
MAT PREVALON FULL STRYKER (MISCELLANEOUS) ×1 IMPLANT
PACK EP LATEX FREE (CUSTOM PROCEDURE TRAY) ×2
PACK EP LF (CUSTOM PROCEDURE TRAY) ×1 IMPLANT
PAD PRO RADIOLUCENT 2001M-C (PAD) ×2 IMPLANT
PATCH CARTO3 (PAD) ×1 IMPLANT
SHEATH BAYLIS SUREFLEX  M 8.5 (SHEATH) ×2
SHEATH BAYLIS SUREFLEX M 8.5 (SHEATH) IMPLANT
SHEATH PINNACLE 7F 10CM (SHEATH) ×1 IMPLANT
SHEATH PINNACLE 8F 10CM (SHEATH) ×1 IMPLANT
SHEATH PROBE COVER 6X72 (BAG) ×1 IMPLANT
SHEATH SWARTZ RAMP 8.5F 60CM (SHEATH) ×1 IMPLANT

## 2021-01-22 NOTE — Progress Notes (Signed)
Discharge instructions reviewed with pt and her husband. Both voice understanding.  

## 2021-01-22 NOTE — Anesthesia Procedure Notes (Signed)
Procedure Name: Intubation Date/Time: 01/22/2021 10:15 AM Performed by: Janene Harvey, CRNA Pre-anesthesia Checklist: Patient identified, Emergency Drugs available, Suction available and Patient being monitored Patient Re-evaluated:Patient Re-evaluated prior to induction Oxygen Delivery Method: Circle system utilized Preoxygenation: Pre-oxygenation with 100% oxygen Induction Type: IV induction Ventilation: Mask ventilation without difficulty and Oral airway inserted - appropriate to patient size Laryngoscope Size: Mac and 4 Grade View: Grade I Tube type: Oral Tube size: 7.0 mm Number of attempts: 1 Airway Equipment and Method: Stylet and Oral airway Placement Confirmation: ETT inserted through vocal cords under direct vision, positive ETCO2 and breath sounds checked- equal and bilateral Secured at: 22 cm Tube secured with: Tape Dental Injury: Teeth and Oropharynx as per pre-operative assessment  Comments: Placed by Rexford Maus, SRNA

## 2021-01-22 NOTE — Progress Notes (Signed)
Anklets x2 and toe ring given to pt husband

## 2021-01-22 NOTE — Transfer of Care (Signed)
Immediate Anesthesia Transfer of Care Note  Patient: Suzanne Long  Procedure(s) Performed: A-FLUTTER ABLATION  Patient Location: Cath Lab  Anesthesia Type:General  Level of Consciousness: awake and drowsy  Airway & Oxygen Therapy: Patient Spontanous Breathing  Post-op Assessment: Report given to RN and Post -op Vital signs reviewed and stable  Post vital signs: Reviewed and stable  Last Vitals:  Vitals Value Taken Time  BP 121/75 01/22/21 1152  Temp 36.7 C 01/22/21 1153  Pulse 65 01/22/21 1153  Resp 15 01/22/21 1153  SpO2 99 % 01/22/21 1153  Vitals shown include unvalidated device data.  Last Pain:  Vitals:   01/22/21 1153  TempSrc: Temporal  PainSc: Asleep      Patients Stated Pain Goal: 3 (30/07/62 2633)  Complications: No notable events documented.

## 2021-01-22 NOTE — H&P (Signed)
Electrophysiology Office Note   Date:  3/87/5643   ID:  Suzanne Long, DOB [birth year], MRN [id number]  PCP:  Lennie Odor, PA  Cardiologist:  Aundra Dubin Primary Electrophysiologist:  Sheenah Dimitroff Meredith Leeds, MD    Chief Complaint: Atrial flutter   History of Present Illness: Suzanne Long is a 50 y.o. female who is being seen today for the evaluation of atrial flutter at the request of No ref. provider found. Presenting today for electrophysiology evaluation.  She has a history significant for chronic systolic heart failure due to nonischemic cardiomyopathy, hypertension, obesity, and atrial flutter.  She presented to the hospital 09/25/2020 in atrial flutter.  She was found to have a reduced ejection fraction of 20 to 25%.  She was started on Eliquis and had a TEE cardioversion.  She was also started on optimal heart failure medications.  Since then she has done well and has noted no further episodes of atrial flutter.  She feels quite a bit improved.  She had a lot of shortness of breath, weakness, and fatigue when she came into the hospital.  Today, denies symptoms of palpitations, chest pain, shortness of breath, orthopnea, PND, lower extremity edema, claudication, dizziness, presyncope, syncope, bleeding, or neurologic sequela. The patient is tolerating medications without difficulties. Ablation today.    Past Medical History:  Diagnosis Date   CHF (congestive heart failure) (HCC)    Dyspnea    Hypertension    Hypothyroidism    Joint pain    Multiple food allergies    NICM (nonischemic cardiomyopathy) ([place])    a. Echo 2/17:  moderate LVH, EF 30-35%, diffuse HK, moderate LAE   NICM (nonischemic cardiomyopathy) ([place]) 09/26/2020   Non-ST elevation (NSTEMI) myocardial infarction ([place]) 09/26/2020   Past Surgical History:  Procedure Laterality Date   BUBBLE STUDY  09/26/2020   Procedure: BUBBLE STUDY;  Surgeon: Freada Bergeron, MD;  Location: [place];  Service: Cardiovascular;;    CARDIOVERSION N/A 09/26/2020   Procedure: CARDIOVERSION;  Surgeon: Freada Bergeron, MD;  Location: [place];  Service: Cardiovascular;  Laterality: N/A;   LEFT HEART CATH AND CORONARY ANGIOGRAPHY N/A 09/24/2020   Procedure: LEFT HEART CATH AND CORONARY ANGIOGRAPHY;  Surgeon: Lorretta Harp, MD;  Location: [facility];  Service: Cardiovascular;  Laterality: N/A;   MYOMECTOMY     None     TEE WITHOUT CARDIOVERSION N/A 09/26/2020   Procedure: TRANSESOPHAGEAL ECHOCARDIOGRAM (TEE);  Surgeon: Freada Bergeron, MD;  Location: [facility] ENDOSCOPY;  Service: Cardiovascular;  Laterality: N/A;     Current Facility-Administered Medications  Medication Dose Route Frequency Provider Last Rate Last Admin   0.9 %  sodium chloride infusion   Intravenous Continuous Brittni Hult, Ocie Doyne, MD       acetaminophen (TYLENOL) tablet 1,000 mg  1,000 mg Oral Once Duane Boston, MD        Allergies:   Shellfish allergy, Gadolinium derivatives, Iodine, and Latex   Social History:  The patient  reports that she has never smoked. She has never used smokeless tobacco. She reports current alcohol use. She reports that she does not use drugs.   Family History:  The patient's family history includes Depression in her mother; Diabetes in her mother; Hypertension in her mother; Kidney failure in her brother; Muscular dystrophy in her brother and son; Obesity in her mother.   ROS:  Please see the history of present illness.   Otherwise, review of systems is positive for none.   All other systems are reviewed and  negative.   PHYSICAL EXAM: VS:  BP (!) 120/94   Pulse 61   Temp 98.3 F (36.8 C) (Oral)   Resp 18   Ht 5' 8.5" (1.74 m)   Wt 122 kg   LMP 01/11/2021   SpO2 96%   BMI 40.31 kg/m  , BMI Body mass index is 40.31 kg/m. GEN: Well nourished, well developed, in no acute distress  HEENT: normal  Neck: no JVD, carotid bruits, or masses Cardiac: RRR; no murmurs, rubs, or gallops,no edema   Respiratory:  clear to auscultation bilaterally, normal work of breathing GI: soft, nontender, nondistended, + BS MS: no deformity or atrophy  Skin: warm and dry Neuro:  Strength and sensation are intact Psych: euthymic mood, full affect  Recent Labs: 09/22/2020: B Natriuretic Peptide 872.5; Magnesium 1.9; TSH 4.445 01/16/2021: BUN 11; Creatinine, Ser 0.95; Hemoglobin 14.2; Platelets 261; Potassium 4.5; Sodium 138    Lipid Panel     Component Value Date/Time   CHOL 170 09/23/2020 0433   CHOL 175 05/31/2019 1140   TRIG 80 09/23/2020 0433   HDL 45 09/23/2020 0433   HDL 51 05/31/2019 1140   CHOLHDL 3.8 09/23/2020 0433   VLDL 16 09/23/2020 0433   LDLCALC 109 (H) 09/23/2020 0433   LDLCALC 112 (H) 05/31/2019 1140     Wt Readings from Last 3 Encounters:  01/22/21 122 kg  12/27/20 125.2 kg  11/29/20 123.8 kg      Other studies Reviewed: Additional studies/ records that were reviewed today include: Left heart catheterization 09/24/2020 Review of the above records today demonstrates:  There is severe left ventricular systolic dysfunction. LV end diastolic pressure is mildly elevated. The left ventricular ejection fraction is less than 25% by visual estimate.  TEE 09/26/2020  1. Left ventricular ejection fraction, by estimation, is 20 to 25%. The  left ventricle has severely decreased function. The left ventricle  demonstrates global hypokinesis. The left ventricular internal cavity size  was mildly dilated.   2. Right ventricular systolic function is severely reduced. The right  ventricular size is normal.   3. Left atrial size was severely dilated. No left atrial/left atrial  appendage thrombus was detected.   4. Right atrial size was mildly dilated.   5. The pericardial effusion is localized near the right ventricle.   6. The mitral valve is normal in structure. Mild mitral valve  regurgitation.   7. The aortic valve is tricuspid. There is mild calcification of the  aortic  valve. There is mild thickening of the aortic valve. Aortic valve  regurgitation is not visualized. No aortic stenosis is present.   8. There is mild (Grade II) plaque.   9. Agitated saline contrast bubble study was negative, with no evidence  of any interatrial shunt.  10. There is smoke seen throughout both atria consistent with a low flow  state. No evidence of thrombus.  ASSESSMENT AND PLAN:  1.  Typical appearing atrial flutter: Patrisia Faeth has presented today for surgery, with the diagnosis of atrial flyutter.  The various methods of treatment have been discussed with the patient and family. After consideration of risks, benefits and other options for treatment, the patient has consented to  Procedure(s): Catheter ablation as a surgical intervention .  Risks include but not limited to complete heart block, stroke, esophageal damage, nerve damage, bleeding, vascular damage, tamponade, perforation, MI, and death. The patient's history has been reviewed, patient examined, no change in status, stable for surgery.  I have reviewed the patient's  chart and labs.  Questions were answered to the patient's satisfaction.    Jolea Dolle Curt Bears, MD 01/22/2021 9:39 AM

## 2021-01-22 NOTE — Discharge Instructions (Signed)

## 2021-01-22 NOTE — Progress Notes (Signed)
Ambulated in hallway tol well no bleeding noted before or after ambulation  

## 2021-01-22 NOTE — Anesthesia Postprocedure Evaluation (Addendum)
Anesthesia Post Note  Patient: Suzanne Long  Procedure(s) Performed: A-FLUTTER ABLATION     Patient location during evaluation: Cath Lab Anesthesia Type: General Level of consciousness: sedated Pain management: pain level controlled Vital Signs Assessment: post-procedure vital signs reviewed and stable Respiratory status: spontaneous breathing and respiratory function stable Cardiovascular status: stable Postop Assessment: no apparent nausea or vomiting Anesthetic complications: no   No notable events documented.  Last Vitals:  Vitals:   01/22/21 1415 01/22/21 1418  BP:  125/74  Pulse: (!) 58 (!) 59  Resp: 13 12  Temp:    SpO2: 100% 100%    Last Pain:  Vitals:   01/22/21 1231  TempSrc:   PainSc: 0-No pain                 Giana Castner DANIEL

## 2021-01-22 NOTE — Anesthesia Preprocedure Evaluation (Addendum)
Anesthesia Evaluation  Patient identified by MRN, date of birth, ID band Patient awake    Reviewed: Allergy & Precautions, NPO status , Patient's Chart, lab work & pertinent test results, reviewed documented beta blocker date and time   History of Anesthesia Complications Negative for: history of anesthetic complications  Airway Mallampati: II  TM Distance: >3 FB     Dental no notable dental hx. (+) Dental Advisory Given   Pulmonary neg pulmonary ROS,    Pulmonary exam normal        Cardiovascular hypertension, Pt. on home beta blockers +CHF  Normal cardiovascular exam+ dysrhythmias Atrial Fibrillation   IMPRESSION: 1. Mild LV dilatation with moderate systolic dysfunction (EF 82%). Thinning/akinesis of lateral wall  2. Mixed cardiomyopathy, with evidence of ischemic and nonischemic etiologies  3. Basal to mid lateral wall subendocardial LGE, consistent with prior infarct. LGE is greater than 50% transmural suggesting myocardium is not viable in this region  4. Septal midwall LGE, which is a scar pattern seen in nonischemic cardiomyopathies and associated with a worse prgnosis  5.  Normal RV size and systolic function (EF 80%)    Neuro/Psych negative neurological ROS     GI/Hepatic negative GI ROS, Neg liver ROS,   Endo/Other  Hypothyroidism Morbid obesity  Renal/GU negative Renal ROS     Musculoskeletal negative musculoskeletal ROS (+)   Abdominal   Peds  Hematology negative hematology ROS (+)   Anesthesia Other Findings   Reproductive/Obstetrics                            Anesthesia Physical Anesthesia Plan  ASA: 3  Anesthesia Plan: General   Post-op Pain Management:    Induction: Intravenous  PONV Risk Score and Plan: 3 and Ondansetron, Dexamethasone and Midazolam  Airway Management Planned:   Additional Equipment:   Intra-op Plan:   Post-operative Plan:  Extubation in OR  Informed Consent: I have reviewed the patients History and Physical, chart, labs and discussed the procedure including the risks, benefits and alternatives for the proposed anesthesia with the patient or authorized representative who has indicated his/her understanding and acceptance.     Dental advisory given  Plan Discussed with: Anesthesiologist and CRNA  Anesthesia Plan Comments:        Anesthesia Quick Evaluation

## 2021-01-23 ENCOUNTER — Encounter (HOSPITAL_COMMUNITY): Payer: Self-pay | Admitting: Cardiology

## 2021-01-23 ENCOUNTER — Ambulatory Visit (HOSPITAL_COMMUNITY)
Admission: RE | Admit: 2021-01-23 | Discharge: 2021-01-23 | Disposition: A | Discharge status: Home / Self Care | Source: Ambulatory Visit | Attending: Cardiology | Admitting: Cardiology

## 2021-01-23 ENCOUNTER — Other Ambulatory Visit: Payer: Self-pay

## 2021-01-23 VITALS — BP 152/84 | HR 56 | Ht 68.5 in | Wt 282.0 lb

## 2021-01-23 DIAGNOSIS — M6281 Muscle weakness (generalized): Secondary | ICD-10-CM | POA: Insufficient documentation

## 2021-01-23 DIAGNOSIS — Z8249 Family history of ischemic heart disease and other diseases of the circulatory system: Secondary | ICD-10-CM | POA: Diagnosis not present

## 2021-01-23 DIAGNOSIS — I493 Ventricular premature depolarization: Secondary | ICD-10-CM | POA: Insufficient documentation

## 2021-01-23 DIAGNOSIS — I428 Other cardiomyopathies: Secondary | ICD-10-CM | POA: Diagnosis not present

## 2021-01-23 DIAGNOSIS — E669 Obesity, unspecified: Secondary | ICD-10-CM | POA: Diagnosis not present

## 2021-01-23 DIAGNOSIS — I110 Hypertensive heart disease with heart failure: Secondary | ICD-10-CM | POA: Insufficient documentation

## 2021-01-23 DIAGNOSIS — Z79899 Other long term (current) drug therapy: Secondary | ICD-10-CM | POA: Insufficient documentation

## 2021-01-23 DIAGNOSIS — I483 Typical atrial flutter: Secondary | ICD-10-CM | POA: Diagnosis not present

## 2021-01-23 DIAGNOSIS — Z7901 Long term (current) use of anticoagulants: Secondary | ICD-10-CM | POA: Diagnosis not present

## 2021-01-23 DIAGNOSIS — Z6841 Body Mass Index (BMI) 40.0 and over, adult: Secondary | ICD-10-CM | POA: Insufficient documentation

## 2021-01-23 DIAGNOSIS — I5022 Chronic systolic (congestive) heart failure: Secondary | ICD-10-CM | POA: Insufficient documentation

## 2021-01-23 DIAGNOSIS — E039 Hypothyroidism, unspecified: Secondary | ICD-10-CM | POA: Diagnosis not present

## 2021-01-23 LAB — BASIC METABOLIC PANEL
Anion gap: 9 (ref 5–15)
BUN: 8 mg/dL (ref 6–20)
CO2: 25 mmol/L (ref 22–32)
Calcium: 9.3 mg/dL (ref 8.9–10.3)
Chloride: 104 mmol/L (ref 98–111)
Creatinine, Ser: 0.83 mg/dL (ref 0.44–1.00)
GFR, Estimated: >60 mL/min (ref >60)
Glucose, Bld: 91 mg/dL (ref 70–99)
Potassium: 4.7 mmol/L (ref 3.5–5.1)
Sodium: 138 mmol/L (ref 135–145)

## 2021-01-23 LAB — CK: Total CK: 1214 U/L — ABNORMAL HIGH (ref 38–234)

## 2021-01-23 MED ORDER — ENTRESTO 97-103 MG PO TABS: 1.0000 | ORAL_TABLET | Freq: Two times a day (BID) | ORAL | 6 refills | Status: DC

## 2021-01-23 NOTE — Progress Notes (Signed)
Patient ID: Suzanne Long, female   DOB: 04-Jun-1971, 50 y.o.   MRN: [id number] PCP: Lennie Odor HF Cardiology: Dr. Aundra Dubin  51 y.o. with history of HTN and nonischemic cardiomyopathy presents for followup of CHF.      She was diagnosed with CHF initially in 2011.  At that time, she was hospitalized with volume overload.  Coronary angiography in 2011 showed no significant CAD, but EF was about 25%.  BP was very high.  She was started on cardiac meds and BP improved.  Per her report, EF improved.  She eventually stopped all her medications.  She was then hospitalized in 11/16 with CHF exacerbation. EF was 30-35% on 11/16 echo and 30-35% on 2/17 echo.   Echo in 7/17 showed EF up to 40%.   She stopped her medications again.  She was then admitted in 3/22 with atrial flutter/RVR and CHF.  Unsure how long atrial flutter was present as she did not feel palpitations.  She had TEE-guided DCCV.  TEE showed EF 20-25% with severely decreased RV function, mild MR.  LHC in 3/22 showed no significant CAD.  Cardiac MRI in 6/22 showed LV EF 31%, RVEF 52%, basal-mid lateral subendocardial LGE (>50% wall thickness), septal mid-wall LGE.  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  In 7/22, she had atrial flutter ablation.   Patient returns for followup of CHF.  She is in NSR today.  No palpitations.  She has chronic leg weakness, she struggles to go from squatting to standing.  Weight is up 6 lbs.   She walks and swims for exercise, no significant exertional dyspnea.  No chest pain.  No orthopnea/PND.    Labs (3/17): K 3.7, creatinine 0.84, SPEP negative, TSH normal.  Labs (5/17): K 3.9, creatinine 0.81, BNP normal Labs (3/22): K 5.2, creatinine 1.0  ECG (personally reviewed): NSR, septal Qs, left axis deviation  PMH: 1. Chronic systolic CHF: 1st noted in 2011.  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  - LHC (2011) with no significant CAD, EF 25%.   - Echo (11/16) with EF 30-35%, diffuse hypokinesis. - Echo (2/17) with EF 30-35%, moderate LVH, diffuse hypokinesis.  - Echo (7/17) with EF 40%, severe LV dilation, grade II diastolic dysfunction.  - TEE (3/22) with EF 20-25% with severely decreased RV function, mild MR - LHC (3/22): No significant CAD.  - Cardiac MRI (6/22): LV EF 31%, RVEF 52%, basal-mid lateral subendocardial LGE (>50% wall thickness), septal mid-wall LGE.   2. HTN 3. Hypothyroidism 4. Obesity.  5. Goiter 6. Atrial flutter: 3/22, s/p DCCV.  - Ablation in 7/22.  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   SH:  Nonsmoker.  Rare ETOH.  No drugs.    FH: Mother, grandmother with HTN.  Uncle with MI. 2 sons with Duchenne muscular dystrophy (both have passed away).  Brother with Duchenne muscular dystrophy.  ROS: All systems reviewed and negative except as per HPI.   Current Outpatient Medications  Medication Sig Dispense Refill   apixaban (ELIQUIS) 5 MG TABS tablet Take 1 tablet (5 mg total) by mouth 2 (two) times daily. 180 tablet 1   empagliflozin (JARDIANCE) 10 MG TABS tablet Take 1 tablet (10 mg total) by mouth daily. 90 tablet 3   furosemide (LASIX) 40 MG tablet Take 1 tablet (40 mg total) by  mouth every other day. 15 tablet 6   metoprolol succinate (TOPROL XL) 100 MG 24 hr tablet Take 1 tablet (100 mg total) by mouth daily. Take with or immediately following a meal. 90 tablet 3   polyvinyl alcohol (LIQUIFILM TEARS) 1.4 % ophthalmic solution Place 1 drop into both eyes 3 (three) times daily.     sacubitril-valsartan (ENTRESTO) 97-103 MG Take 1 tablet by mouth 2 (two) times daily. 60 tablet 6   spironolactone (ALDACTONE) 25 MG tablet Take 1 tablet (25 mg total) by mouth daily. 90 tablet 3   No current facility-administered medications for this  encounter.   BP (!) 152/84   Pulse (!) 56   Ht 5' 8.5" (1.74 m)   Wt 127.9 kg (282 lb)   LMP 01/11/2021   SpO2 97%   BMI 42.25 kg/m  General: NAD Neck: No JVD, no thyromegaly or thyroid nodule.  Lungs: Clear to auscultation bilaterally with normal respiratory effort. CV: Nondisplaced PMI.  Heart regular S1/S2, no S3/S4, no murmur.  No peripheral edema.  No carotid bruit.  Normal pedal pulses.  Abdomen: Soft, nontender, no hepatosplenomegaly, no distention.  Skin: Intact without lesions or rashes.  Neurologic: Alert and oriented x 3.  Psych: Normal affect. Extremities: No clubbing or cyanosis.  HEENT: Normal.   Assessment/Plan: 1. Chronic systolic CHF: Nonischemic cardiomyopathy. No CAD on coronary angiography in 2011 and 3/22.  She had 2 sons and a brother with Duchenne's muscular dystrophy.  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  TEE in 3/22 showed EF 20-25% with severe RV dysfunction.  Cardiac MRI in 6/22 showed LV EF 31%, RVEF 52%, basal-mid lateral subendocardial LGE (>50% wall thickness), septal mid-wall LGE.  LGE, especially in the inferolateral wall, is seen with DMD-related cardiomyopathy. She is not volume overloaded on exam.  NYHA class I-II symptoms. - She is taking Lasix 40 mg every other day.  BMET today.  - Continue Toprol XL 100 mg daily.  - Increase Entresto to 97/103 bid. BMET 10 days.  - Continue spironolactone 25 mg daily. - Continue Jardiance 10 mg daily.   - I am going to refer her for genetics counseling and evaluation with Dr. Lattie Corns. - Repeat echo at followup in 3 months on maximal medical therapy.  If EF remains low, which I suspect, she will need ICD. Narrow QRS so not a CRT candidate.  2. HTN: BP elevated today, increasing Entresto.     3. H/o hypothyroidism: No thyroid replacement. Following with endocrinology for goiter. 4. Obesity: We discussed diet and exercise for weight loss again.   5. Atrial flutter: Typical flutter in 3/22 with CHF decompensation.  Now s/p DCCV back to NSR and atrial flutter ablation, she is in NSR today.  - She can stop apixaban if she remains in NSR, will wait for her to see Dr. Curt Bears again.  6. DMD heterozygote: I suspect this is the cause of her mild chronic muscle weakness.  - Check CPK level.   Followup in 3 wks with clinical pharmacist for med titration (?add Bidil), see me in 3 months with echo.   Loralie Champagne 01/23/2021

## 2021-01-23 NOTE — Patient Instructions (Addendum)
Increase Entresto to 97/103 mg Twice daily   Labs done today, your results will be available in MyChart, we will contact you for abnormal readings.  Your physician recommends that you return for lab work in: 1-2 weeks  Please follow up with our heart failure pharmacist in 3-4 weeks for med titration  You have been referred to Dr Broadus [facility] at [facility] for genetic counseling  Your physician recommends that you schedule a follow-up appointment in: 3 months with echocardiogram  Do the following things EVERYDAY: Weigh yourself in the morning before breakfast. Write it down and keep it in a log. Take your medicines as prescribed Eat low salt foods--Limit salt (sodium) to 2000 mg per day.  Stay as active as you can everyday Limit all fluids for the day to less than 2 liters  If you have any questions or concerns before your next appointment please send [place] a message through [facility] or call our office at [phone].    TO LEAVE A MESSAGE FOR THE NURSE SELECT OPTION 2, PLEASE LEAVE A MESSAGE INCLUDING: YOUR NAME DATE OF BIRTH CALL BACK NUMBER REASON FOR CALL**this is important as we prioritize the call backs  YOU WILL RECEIVE A CALL BACK THE SAME DAY AS LONG AS YOU CALL BEFORE 4:00 PM  milAt the [facility], you and your health needs are our priority. As part of our continuing mission to provide you with exceptional heart care, we have created designated Provider Care Teams. These Care Teams include your primary Cardiologist (physician) and Advanced Practice Providers (APPs- Physician Assistants and Nurse Practitioners) who all work together to provide you with the care you need, when you need it.   You may see any of the following providers on your designated Care Team at your next follow up: Dr Glori Bickers Dr Loralie Champagne Dr Patrice Paradise, NP Lyda Jester, [place] Ginnie Smart Audry Riles, PharmD   Please be sure to bring in all your  medications bottles to every appointment.

## 2021-02-04 ENCOUNTER — Ambulatory Visit (HOSPITAL_COMMUNITY)
Admission: RE | Admit: 2021-02-04 | Discharge: 2021-02-04 | Disposition: A | Discharge status: Home / Self Care | Payer: Self-pay | Source: Ambulatory Visit | Attending: Cardiology | Admitting: Cardiology

## 2021-02-04 ENCOUNTER — Other Ambulatory Visit: Payer: Self-pay

## 2021-02-04 ENCOUNTER — Other Ambulatory Visit (HOSPITAL_COMMUNITY)

## 2021-02-04 DIAGNOSIS — I428 Other cardiomyopathies: Secondary | ICD-10-CM | POA: Insufficient documentation

## 2021-02-04 DIAGNOSIS — I5022 Chronic systolic (congestive) heart failure: Secondary | ICD-10-CM | POA: Insufficient documentation

## 2021-02-04 LAB — BASIC METABOLIC PANEL
Anion gap: 6 (ref 5–15)
BUN: 8 mg/dL (ref 6–20)
CO2: 30 mmol/L (ref 22–32)
Calcium: 9.4 mg/dL (ref 8.9–10.3)
Chloride: 102 mmol/L (ref 98–111)
Creatinine, Ser: 0.94 mg/dL (ref 0.44–1.00)
GFR, Estimated: >60 mL/min (ref >60)
Glucose, Bld: 86 mg/dL (ref 70–99)
Potassium: 4 mmol/L (ref 3.5–5.1)
Sodium: 138 mmol/L (ref 135–145)

## 2021-02-07 ENCOUNTER — Other Ambulatory Visit (HOSPITAL_COMMUNITY): Payer: Self-pay | Admitting: Cardiology

## 2021-02-26 ENCOUNTER — Encounter: Payer: Self-pay | Admitting: Cardiology

## 2021-02-26 ENCOUNTER — Other Ambulatory Visit (HOSPITAL_COMMUNITY): Payer: Self-pay

## 2021-02-26 ENCOUNTER — Ambulatory Visit (INDEPENDENT_AMBULATORY_CARE_PROVIDER_SITE_OTHER)

## 2021-02-26 ENCOUNTER — Ambulatory Visit (INDEPENDENT_AMBULATORY_CARE_PROVIDER_SITE_OTHER): Admitting: Cardiology

## 2021-02-26 ENCOUNTER — Other Ambulatory Visit: Payer: Self-pay

## 2021-02-26 ENCOUNTER — Inpatient Hospital Stay (HOSPITAL_COMMUNITY)
Admission: RE | Admit: 2021-02-26 | Discharge: 2021-02-26 | Disposition: A | Discharge status: Home / Self Care | Source: Ambulatory Visit

## 2021-02-26 VITALS — BP 108/54 | HR 69 | Ht 68.5 in | Wt 281.0 lb

## 2021-02-26 DIAGNOSIS — I483 Typical atrial flutter: Secondary | ICD-10-CM

## 2021-02-26 DIAGNOSIS — I493 Ventricular premature depolarization: Secondary | ICD-10-CM

## 2021-02-26 NOTE — Progress Notes (Unsigned)
Patient enrolled for [employer] to mail a 3 day ZIO XT to address on file.

## 2021-02-26 NOTE — Patient Instructions (Addendum)
Medication Instructions:  Your physician has recommended you make the following change in your medication:  STOP Eliquis  *If you need a refill on your cardiac medications before your next appointment, please call your pharmacy*   Lab Work: None ordered   Testing/Procedures: Your physician has recommended that you wear a 3 day holter monitor. Holter monitors are medical devices that record the heart's electrical activity. Doctors most often use these monitors to diagnose arrhythmias. Arrhythmias are problems with the speed or rhythm of the heartbeat. The monitor is a small, portable device. You can wear one while you do your normal daily activities.     Follow-Up: At [facility], you and your health needs are our priority.  As part of our continuing mission to provide you with exceptional heart care, we have created designated Provider Care Teams.  These Care Teams include your primary Cardiologist (physician) and Advanced Practice Providers (APPs -  Physician Assistants and Nurse Practitioners) who all work together to provide you with the care you need, when you need it.  Your next appointment:   To be determined after monitor and echo  The format for your next appointment:   In Person  Provider:   Allegra Lai, MD    Thank you for choosing [facility]!!   Trinidad Curet, RN [phone]   Other Instructions                             ZIO XT- Long Term Monitor Instructions  Your physician has requested you wear a ZIO patch monitor for 14 days.  This is a single patch monitor. [employer] supplies one patch monitor per enrollment. Additional stickers are not available. Please do not apply patch if you will be having a Nuclear Stress Test,  Echocardiogram, Cardiac CT, MRI, or Chest Xray during the period you would be wearing the  monitor. The patch cannot be worn during these tests. You cannot remove and re-apply the  ZIO XT patch monitor.  Your ZIO patch monitor  will be mailed 3 day USPS to your address on file. It may take 3-5 days  to receive your monitor after you have been enrolled.  Once you have received your monitor, please review the enclosed instructions. Your monitor  has already been registered assigning a specific monitor serial # to you.  Billing and Patient Assistance Program Information  We have supplied [employer] with any of your insurance information on file for billing purposes. [employer] offers a sliding scale Patient Assistance Program for patients that do not have  insurance, or whose insurance does not completely cover the cost of the ZIO monitor.  You must apply for the Patient Assistance Program to qualify for this discounted rate.  To apply, please call [employer] at [phone], select option 4, select option 2, ask to apply for  Patient Assistance Program. Theodore Demark will ask your household income, and how many people  are in your household. They will quote your out-of-pocket cost based on that information.  [employer] will also be able to set up a 52-month, interest-free payment plan if needed.  Applying the monitor   Shave hair from upper left chest.  Hold abrader disc by orange tab. Rub abrader in 40 strokes over the upper left chest as  indicated in your monitor instructions.  Clean area with 4 enclosed alcohol pads. Let dry.  Apply patch as indicated in monitor instructions. Patch will be placed under collarbone on left  side of chest with arrow pointing upward.  Rub patch adhesive wings for 2 minutes. Remove white label marked "1". Remove the white  label marked "2". Rub patch adhesive wings for 2 additional minutes.  While looking in a mirror, press and release button in center of patch. A small green light will  flash 3-4 times. This will be your only indicator that the monitor has been turned on.  Do not shower for the first 24 hours. You may shower after the first 24 hours.  Press the button if you feel a symptom. You  will hear a small click. Record Date, Time and  Symptom in the Patient Logbook.  When you are ready to remove the patch, follow instructions on the last 2 pages of Patient  Logbook. Stick patch monitor onto the last page of Patient Logbook.  Place Patient Logbook in the blue and white box. Use locking tab on box and tape box closed  securely. The blue and white box has prepaid postage on it. Please place it in the mailbox as  soon as possible. Your physician should have your test results approximately 7 days after the  monitor has been mailed back to [facility].  Call [place] at [phone] if you have questions regarding  your ZIO XT patch monitor. Call them immediately if you see an orange light blinking on your  monitor.  If your monitor falls off in less than 4 days, contact our Monitor department at [phone].  If your monitor becomes loose or falls off after 4 days call [employer] at [phone] for  suggestions on securing your monitor

## 2021-02-26 NOTE — Progress Notes (Signed)
Electrophysiology Office Note   Date:  02/26/2021   ID:  Suzanne Long, DOB [birth year], MRN [id number]  PCP:  Pcp, No  Cardiologist:  Shirlee Latch Primary Electrophysiologist:  Vishruth Seoane [name], MD    Chief Complaint: Atrial flutter   History of Present Illness: Suzanne Long is a 50 y.o. female who is being seen today for the evaluation of atrial flutter at the request of [place], [place], [place]. Presenting today for electrophysiology evaluation.  She has a history significant for chronic systolic heart failure due to nonischemic cardiomyopathy, hypertension, obesity, and atrial flutter.  She presented to the hospital 09/25/2020 in atrial flutter.  She was found to have an ejection fraction of 20 to 25%.  She was started on Eliquis with a TEE and cardioversion.  She is now status post atrial flutter ablation 01/22/2021.  Today, denies symptoms of chest pain, shortness of breath, orthopnea, PND, lower extremity edema, claudication, dizziness, presyncope, syncope, bleeding, or neurologic sequela. The patient is tolerating medications without difficulties.  She currently feels well.  She has noted no further episodes of atrial flutter.  She does have a few episodic palpitations.  Her ECG today shows PVCs.   Past Medical History:  Diagnosis Date   CHF (congestive heart failure) (HCC)    Dyspnea    Hypertension    Hypothyroidism    Joint pain    Multiple food allergies    NICM (nonischemic cardiomyopathy) (HCC)    a. Echo 2/17:  moderate LVH, EF 30-35%, diffuse HK, moderate LAE   NICM (nonischemic cardiomyopathy) (HCC) 09/26/2020   Non-ST elevation (NSTEMI) myocardial infarction (HCC) 09/26/2020   Past Surgical History:  Procedure Laterality Date   A-FLUTTER ABLATION N/A 01/22/2021   Procedure: A-FLUTTER ABLATION;  Surgeon: Regan Lemming, MD;  Location: [facility] INVASIVE CV LAB;  Service: Cardiovascular;  Laterality: N/A;   BUBBLE STUDY  09/26/2020   Procedure: BUBBLE STUDY;  Surgeon:  Meriam Sprague, MD;  Location[facility] ENDOSCOPY;  Service: Cardiovascular;;   CARDIOVERSION N/A 09/26/2020   Procedure: CARDIOVERSION;  Surgeon: Meriam Sprague, MD;  [facility] ENDOSCOPY;  Service: Cardiovascular;  Laterality: N/A;   LEFT HEART CATH AND CORONARY ANGIOGRAPHY N/A 09/24/2020   Procedure: LEFT HEART CATH AND CORONARY ANGIOGRAPHY;  Surgeon: Runell Gess, MD;  Location: [facility] INVASIVE CV LAB;  Service: Cardiovascular;  Laterality: N/A;   MYOMECTOMY     None     TEE WITHOUT CARDIOVERSION N/A 09/26/2020   Procedure: TRANSESOPHAGEAL ECHOCARDIOGRAM (TEE);  Surgeon: Meriam Sprague, MD;  Location: [facility] ENDOSCOPY;  Service: Cardiovascular;  Laterality: N/A;     Current Outpatient Medications  Medication Sig Dispense Refill   apixaban (ELIQUIS) 5 MG TABS tablet Take 1 tablet (5 mg total) by mouth 2 (two) times daily. 180 tablet 1   empagliflozin (JARDIANCE) 10 MG TABS tablet Take 1 tablet (10 mg total) by mouth daily. 90 tablet 3   furosemide (LASIX) 40 MG tablet Take 1 tablet (40 mg total) by mouth every other day. 15 tablet 6   metoprolol succinate (TOPROL XL) 100 MG 24 hr tablet Take 1 tablet (100 mg total) by mouth daily. Take with or immediately following a meal. 90 tablet 3   polyvinyl alcohol (LIQUIFILM TEARS) 1.4 % ophthalmic solution Place 1 drop into both eyes 3 (three) times daily.     sacubitril-valsartan (ENTRESTO) 97-103 MG Take 1 tablet by mouth 2 (two) times daily. 60 tablet 6   spironolactone (ALDACTONE) 25 MG tablet Take 1 tablet (25 mg total)  by mouth daily. 90 tablet 3   No current facility-administered medications for this visit.    Allergies:   Shellfish allergy, Gadolinium derivatives, Iodine, and Latex   Social History:  The patient  reports that she has never smoked. She has never used smokeless tobacco. She reports current alcohol use. She reports that she does not use drugs.   Family History:  The patient's family history includes Depression in  her mother; Diabetes in her mother; Hypertension in her mother; Kidney failure in her brother; Muscular dystrophy in her brother and son; Obesity in her mother.   ROS:  Please see the history of present illness.   Otherwise, review of systems is positive for none.   All other systems are reviewed and negative.   PHYSICAL EXAM: VS:  BP (!) 108/54   Pulse 69   Ht 5' 8.5" (1.74 m)   Wt 281 lb (127.5 kg)   BMI 42.10 kg/m  , BMI Body mass index is 42.1 kg/m. GEN: Well nourished, well developed, in no acute distress  HEENT: normal  Neck: no JVD, carotid bruits, or masses Cardiac: Irregular; no murmurs, rubs, or gallops,no edema  Respiratory:  clear to auscultation bilaterally, normal work of breathing GI: soft, nontender, nondistended, + BS MS: no deformity or atrophy  Skin: warm and dry Neuro:  Strength and sensation are intact Psych: euthymic mood, full affect  EKG:  EKG is ordered today. Personal review of the ekg ordered shows sinus rhythm, ventricular bigeminy  Recent Labs: 09/22/2020: B Natriuretic Peptide 872.5; Magnesium 1.9; TSH 4.445 01/16/2021: Hemoglobin 14.2; Platelets 261 02/04/2021: BUN 8; Creatinine, Ser 0.94; Potassium 4.0; Sodium 138    Lipid Panel     Component Value Date/Time   CHOL 170 09/23/2020 0433   CHOL 175 05/31/2019 1140   TRIG 80 09/23/2020 0433   HDL 45 09/23/2020 0433   HDL 51 05/31/2019 1140   CHOLHDL 3.8 09/23/2020 0433   VLDL 16 09/23/2020 0433   LDLCALC 109 (H) 09/23/2020 0433   LDLCALC 112 (H) 05/31/2019 1140     Wt Readings from Last 3 Encounters:  02/26/21 281 lb (127.5 kg)  01/23/21 282 lb (127.9 kg)  01/22/21 269 lb (122 kg)      Other studies Reviewed: Additional studies/ records that were reviewed today include: Left heart catheterization 09/24/2020 Review of the above records today demonstrates:  There is severe left ventricular systolic dysfunction. LV end diastolic pressure is mildly elevated. The left ventricular ejection  fraction is less than 25% by visual estimate.  TEE 09/26/2020  1. Left ventricular ejection fraction, by estimation, is 20 to 25%. The  left ventricle has severely decreased function. The left ventricle  demonstrates global hypokinesis. The left ventricular internal cavity size  was mildly dilated.   2. Right ventricular systolic function is severely reduced. The right  ventricular size is normal.   3. Left atrial size was severely dilated. No left atrial/left atrial  appendage thrombus was detected.   4. Right atrial size was mildly dilated.   5. The pericardial effusion is localized near the right ventricle.   6. The mitral valve is normal in structure. Mild mitral valve  regurgitation.   7. The aortic valve is tricuspid. There is mild calcification of the  aortic valve. There is mild thickening of the aortic valve. Aortic valve  regurgitation is not visualized. No aortic stenosis is present.   8. There is mild (Grade II) plaque.   9. Agitated saline contrast bubble study was  negative, with no evidence  of any interatrial shunt.  10. There is smoke seen throughout both atria consistent with a low flow  state. No evidence of thrombus.  ASSESSMENT AND PLAN:  1.  Typical atrial flutter: Currently on Eliquis.  CHA2DS2-VASc of 2.  Is now status post ablation 01/22/2021.  Has remained in sinus rhythm.  Due to that we Adlee Paar stop Eliquis.  2.  Chronic systolic heart failure due to nonischemic cardiomyopathy: Currently on Lasix, Toprol-XL, Entresto, Aldactone, Jardiance.  Now that she is back in rhythm, plans for a repeat echo.  If ejection fraction remains low, would be a candidate for ICD implant.  3.  PVCs: In ventricular bigeminy today.  Due to that, we Geet Hosking plan for a 3-day monitor.  She has a high burden of PVCs, Makell Cyr potentially need to start therapy.  Current medicines are reviewed at length with the patient today.   The patient does not have concerns regarding her medicines.  The  following changes were made today: Stop Eliquis  Labs/ tests ordered today include:  Orders Placed This Encounter  Procedures   LONG TERM MONITOR (3-14 DAYS)   EKG 12-Lead      Disposition:   FU with Talyah Seder 3 months  Signed, Apollos Tenbrink Meredith Leeds, MD  02/26/2021 9:02 AM     [facility] [address] [place] [address] [phone] (office) [phone] (fax)

## 2021-02-26 NOTE — Addendum Note (Signed)
Addended by: Stanton Kidney on: 02/26/2021 09:12 AM   Modules accepted: Orders

## 2021-02-27 ENCOUNTER — Telehealth: Payer: Self-pay | Admitting: Cardiology

## 2021-02-27 NOTE — Telephone Encounter (Signed)
[facility] is calling stating they received correspondence on this patient, but reports she has not been to their office since 2014 so they are no longer her PCP.

## 2021-02-28 NOTE — Progress Notes (Signed)
PCP: [facility] Cardiology: Dr. Aundra Dubin  HPI:  50 y.o. with history of HTN and nonischemic cardiomyopathy presents for followup of CHF.       She was diagnosed with CHF initially in 2011.  At that time, she was hospitalized with volume overload.  Coronary angiography in 2011 showed no significant CAD, but EF was about 25%.  BP was very high.  She was started on cardiac meds and BP improved.  Per her report, EF improved.  She eventually stopped all her medications.  She was then hospitalized in 05/2015 with CHF exacerbation. EF was 30-35% on 11/16 echo and 30-35% on 08/2015 echo.   Echo in 01/2016 showed EF up to 40%.   She stopped her medications again.  She was then admitted in 09/2020 with atrial flutter/RVR and CHF.  Unsure how long atrial flutter was present as she did not feel palpitations.  She had TEE-guided DCCV.  TEE showed EF 20-25% with severely decreased RV function, mild MR.  LHC in 3/22 showed no significant CAD.  Cardiac MRI in 12/2020 showed LV EF 31%, RVEF 52%, basal-mid lateral subendocardial LGE (>50% wall thickness), septal mid-wall LGE.  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  In 01/2021, she had atrial flutter ablation.   Patient recently returned to [facility] for followup of CHF on 01/23/21.  She was in NSR.  No palpitations.  She noted chronic leg weakness, she struggles to go from squatting to standing.  Weight was up 6 lbs.   She reported walking and swimming for exercise, no significant exertional dyspnea.  No chest pain.  No orthopnea/PND.  Today she returns to HF clinic for pharmacist medication titration. At last visit with MD, Delene Loll was increased to 97/103 mg BID. She saw Dr. Curt Bears 02/26/21 and Eliquis was discontinued as she has remained in NSR. A 3-day Zio monitor was placed, results pending. Overall she is feeling well today. No dizziness, lightheadedness, chest pain or palpitations. No  SOB/DOE on flat ground. Does get SOB going up stairs, believes this is related to her muscle weakness. Weight has been increasing since furosemide decreased back in June. States her dry weight is ~270. She weighed 285 lbs in clinic today, up 3 lbs from last visit. No LEE, PND or orthopnea. Notes abdominal bloating. Poor appetite, which is normal for her. Taking all medications as prescribed and tolerating all medications.    HF Medications: Metoprolol succinate 100 mg daily Entresto 97/103 mg BID Spironolactone 25 mg daily Jardiance 10 mg daily Furosemide 40 mg every other day  Has the patient been experiencing any side effects to the medications prescribed?  no  Does the patient have any problems obtaining medications due to transportation or finances?   No, has [employer]  Understanding of regimen: good Understanding of indications: good Potential of compliance: good Patient understands to avoid NSAIDs. Patient understands to avoid decongestants.    Pertinent Lab Values: 02/04/21: Serum creatinine 0.94, BUN 8, Potassium 4.0, Sodium 138  Vital Signs: Weight: 285.4 lbs (last clinic weight: 282 lbs) Blood pressure: 118/68  Heart rate: 63   Assessment/Plan: 1. Chronic systolic CHF: Nonischemic cardiomyopathy. No CAD on coronary angiography in 2011 and 09/2020.  She had 2 sons and a brother with Duchenne's muscular dystrophy.  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  TEE in 09/2020 showed EF 20-25% with severe RV dysfunction.  Cardiac MRI in 12/2020 showed LV EF 31%, RVEF 52%, basal-mid lateral subendocardial LGE (>50% wall thickness), septal mid-wall LGE.  LGE, especially in the inferolateral wall, is seen with DMD-related cardiomyopathy.  - NYHA class I-II symptoms. Weight up  3 lbs from last visit and up ~10 lbs from patient reported dry weight.  - Increase furosemide to 40 mg daily. Repeat BMET in 2 weeks.   - Continue metoprolol succinate 100 mg daily. - Continue Entresto to 97/103 bid.  - Continue spironolactone 25 mg daily. - Continue Jardiance 10 mg daily.   - Start hydralazine 25 mg TID and isosorbide mononitrate 30 mg daily.  - Referred for genetics counseling and evaluation with Dr. Lattie Corns. - Repeat echo at followup in 2 months on maximal medical therapy.  If EF remains low,  she will need ICD. Narrow QRS so not a CRT candidate. 2. HTN: BP improved today - Start hydralazine 25 mg TID and isosorbide mononitrate 30 mg daily as above 3. H/o hypothyroidism: No thyroid replacement. Following with endocrinology for goiter. 4. Obesity: We discussed diet and exercise for weight loss again.   5. Atrial flutter: Typical flutter in 09/2020 with CHF decompensation.  Now s/p DCCV back to NSR and atrial flutter ablation, now back in NSR. - Eliquis discontinued 6. DMD heterozygote: suspected cause of her mild chronic muscle weakness.  Follow up in 2 weeks for BMET and 2 months with Dr. Domingo Dimes.      Audry Riles, PharmD, BCPS, BCCP, CPP Heart Failure Clinic Pharmacist [phone]

## 2021-03-03 DIAGNOSIS — I493 Ventricular premature depolarization: Secondary | ICD-10-CM

## 2021-03-03 DIAGNOSIS — I483 Typical atrial flutter: Secondary | ICD-10-CM | POA: Diagnosis not present

## 2021-03-05 ENCOUNTER — Other Ambulatory Visit: Payer: Self-pay

## 2021-03-05 ENCOUNTER — Ambulatory Visit (HOSPITAL_COMMUNITY)
Admission: RE | Admit: 2021-03-05 | Discharge: 2021-03-05 | Disposition: A | Discharge status: Home / Self Care | Source: Ambulatory Visit | Attending: Internal Medicine | Admitting: Internal Medicine

## 2021-03-05 VITALS — BP 118/68 | HR 63 | Wt 285.4 lb

## 2021-03-05 DIAGNOSIS — I428 Other cardiomyopathies: Secondary | ICD-10-CM | POA: Insufficient documentation

## 2021-03-05 DIAGNOSIS — I483 Typical atrial flutter: Secondary | ICD-10-CM | POA: Diagnosis not present

## 2021-03-05 DIAGNOSIS — E669 Obesity, unspecified: Secondary | ICD-10-CM | POA: Insufficient documentation

## 2021-03-05 DIAGNOSIS — I110 Hypertensive heart disease with heart failure: Secondary | ICD-10-CM | POA: Insufficient documentation

## 2021-03-05 DIAGNOSIS — Z79899 Other long term (current) drug therapy: Secondary | ICD-10-CM | POA: Diagnosis not present

## 2021-03-05 DIAGNOSIS — I5022 Chronic systolic (congestive) heart failure: Secondary | ICD-10-CM | POA: Insufficient documentation

## 2021-03-05 MED ORDER — HYDRALAZINE HCL 25 MG PO TABS: 25.0000 mg | ORAL_TABLET | Freq: Three times a day (TID) | ORAL | 11 refills | Status: DC

## 2021-03-05 MED ORDER — FUROSEMIDE 40 MG PO TABS: 40.0000 mg | ORAL_TABLET | Freq: Every day | ORAL | 3 refills | Status: DC

## 2021-03-05 MED ORDER — ISOSORBIDE MONONITRATE ER 30 MG PO TB24
30.0000 mg | ORAL_TABLET | Freq: Every day | ORAL | 11 refills | Status: DC
Start: 2021-03-05 — End: 2021-04-29

## 2021-03-05 NOTE — Patient Instructions (Addendum)
It was a pleasure seeing you today!  MEDICATIONS: -We are changing your medications today -Increase furosemide to 40 mg (1 tablet) daily -Start hydralazine 25 mg (1 tablet) three times daily -Start isosorbide mononitrate 30 mg (1 tablet) daily -Call if you have questions about your medications.   NEXT APPOINTMENT: Return to clinic in 2 months with Dr. Aundra Dubin.  In general, to take care of your heart failure: -Limit your fluid intake to 2 Liters (half-gallon) per day.   -Limit your salt intake to ideally 2-3 grams (2000-3000 mg) per day. -Weigh yourself daily and record, and bring that "weight diary" to your next appointment.  (Weight gain of 2-3 pounds in 1 day typically means fluid weight.) -The medications for your heart are to help your heart and help you live longer.   -Please contact us before stopping any of your heart medications.  Call the clinic at [phone] with questions or to reschedule future appointments.

## 2021-03-20 ENCOUNTER — Ambulatory Visit (HOSPITAL_COMMUNITY)
Admission: RE | Admit: 2021-03-20 | Discharge: 2021-03-20 | Disposition: A | Discharge status: Home / Self Care | Source: Ambulatory Visit | Attending: Cardiology | Admitting: Cardiology

## 2021-03-20 ENCOUNTER — Other Ambulatory Visit (HOSPITAL_COMMUNITY): Payer: Self-pay | Admitting: Cardiology

## 2021-03-20 ENCOUNTER — Other Ambulatory Visit: Payer: Self-pay

## 2021-03-20 DIAGNOSIS — I5022 Chronic systolic (congestive) heart failure: Secondary | ICD-10-CM | POA: Diagnosis present

## 2021-03-20 LAB — BASIC METABOLIC PANEL
Anion gap: 5 (ref 5–15)
BUN: 13 mg/dL (ref 6–20)
CO2: 28 mmol/L (ref 22–32)
Calcium: 9.2 mg/dL (ref 8.9–10.3)
Chloride: 105 mmol/L (ref 98–111)
Creatinine, Ser: 1.04 mg/dL — ABNORMAL HIGH (ref 0.44–1.00)
GFR, Estimated: >60 mL/min (ref >60)
Glucose, Bld: 94 mg/dL (ref 70–99)
Potassium: 4.3 mmol/L (ref 3.5–5.1)
Sodium: 138 mmol/L (ref 135–145)

## 2021-03-26 ENCOUNTER — Other Ambulatory Visit: Payer: Self-pay

## 2021-03-26 ENCOUNTER — Ambulatory Visit: Admitting: Genetic Counselor

## 2021-04-05 NOTE — Progress Notes (Signed)
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 She reports having acute onset of chest discomfort in 2010, as if "a baby elephant" was sitting on her chest. She sought medical care and was subsequently found to have CHF. She later developed Afib this year. She reports having minimal symptoms other than a tightness in the back of her thighs.  Family history Suzanne Long (III.2) had two sons- both were diagnosed with Duchenne's muscular dystrophy (DMD). Her older son, Suzanne Long (IV.1) was diagnosed with DMD at age 53, wheelchair bound at 68 and later succumbed to his condition at age 66. Her younger son, Suzanne Long (IV.2) was diagnosed with DMD at 49, wheelchair bound at age 39 and died at age 60. Understandably, this was a very painful conversation for her.  Her mother (II.2) collapsed at home and was taken to the hospital where she on a ventilator for 2 days before she died at age 33. An autopsy was not performed. Suzanne Long has 3 half-siblings with her mother- a half-brother (III.5) had DMD and died at age 70. A half-brother (III.3) died of kidney failure at 50 and a half-sister (III.4) died at 39 from a heart attack. She has 2 maternal uncles- one died at 54 (II.4) from ma heart attack and the other died from unknown causes at 54. Maternal grandfather (I.1) died at 30 from a heart attack and grandmother lived to 49.  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 A similar variant c.5155-9G>C has been previously reported as a  likely benign variant. The molecular features of this variant are not consistent to that of a disease-causing mutation as the variant is not located in the consensus splice sequence that is important for gene splicing. While this variant is not reported in large population databases indicating that this is a very rare variant, there are no reports of similarly spaced intronic mutations in DMD gene.   Taken together, it is highly unlikely that this variant is causal to her condition or that of her children. DMD is caused by truncating gene mutations or deletions within the DMD gene that abolishes production of dystrophin the muscle. 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   She tells me that her older son had a muscle biopsy at [facility] and her younger son had blood drawn for genetic testing in 2015. She was not made aware of his test result.  Impression and Plan Therefore, based on the above information and the patient's family history, it is evident that Suzanne Long has inherited the DMD gene mutation from her mother as her half sibling died of DMD at age 31.   We will reach out to [facility] to obtain her son's medical records relating to the DMD gene testing. She has signed the form for this. Upon receipt of the report, I will update this note to indicate the DMD gene mutation that is causal to their condition. She is agreeable with this plan of action.  Please  note that the patient has not been counseled in this visit on personal, cultural or ethical issues that she may face due to her heart condition.   Suzanne Long, Ph.D, [facility] Clinical Molecular Geneticist

## 2021-04-29 ENCOUNTER — Other Ambulatory Visit: Payer: Self-pay

## 2021-04-29 ENCOUNTER — Encounter (HOSPITAL_COMMUNITY): Payer: Self-pay | Admitting: Cardiology

## 2021-04-29 ENCOUNTER — Ambulatory Visit (HOSPITAL_COMMUNITY)
Admission: RE | Admit: 2021-04-29 | Discharge: 2021-04-29 | Disposition: A | Discharge status: Home / Self Care | Source: Ambulatory Visit | Attending: Cardiology | Admitting: Cardiology

## 2021-04-29 ENCOUNTER — Ambulatory Visit (HOSPITAL_BASED_OUTPATIENT_CLINIC_OR_DEPARTMENT_OTHER)
Admission: RE | Admit: 2021-04-29 | Discharge: 2021-04-29 | Disposition: A | Discharge status: Home / Self Care | Source: Ambulatory Visit | Attending: Cardiology | Admitting: Cardiology

## 2021-04-29 VITALS — BP 110/60 | HR 62 | Wt 282.2 lb

## 2021-04-29 DIAGNOSIS — I428 Other cardiomyopathies: Secondary | ICD-10-CM | POA: Diagnosis not present

## 2021-04-29 DIAGNOSIS — I252 Old myocardial infarction: Secondary | ICD-10-CM | POA: Insufficient documentation

## 2021-04-29 DIAGNOSIS — I5022 Chronic systolic (congestive) heart failure: Secondary | ICD-10-CM | POA: Diagnosis not present

## 2021-04-29 DIAGNOSIS — I493 Ventricular premature depolarization: Secondary | ICD-10-CM | POA: Insufficient documentation

## 2021-04-29 DIAGNOSIS — Z8249 Family history of ischemic heart disease and other diseases of the circulatory system: Secondary | ICD-10-CM | POA: Insufficient documentation

## 2021-04-29 DIAGNOSIS — E785 Hyperlipidemia, unspecified: Secondary | ICD-10-CM | POA: Diagnosis not present

## 2021-04-29 DIAGNOSIS — I483 Typical atrial flutter: Secondary | ICD-10-CM | POA: Insufficient documentation

## 2021-04-29 DIAGNOSIS — R519 Headache, unspecified: Secondary | ICD-10-CM | POA: Diagnosis not present

## 2021-04-29 DIAGNOSIS — G7101 Duchenne or Becker muscular dystrophy: Secondary | ICD-10-CM | POA: Diagnosis not present

## 2021-04-29 DIAGNOSIS — Z79899 Other long term (current) drug therapy: Secondary | ICD-10-CM | POA: Insufficient documentation

## 2021-04-29 DIAGNOSIS — M6281 Muscle weakness (generalized): Secondary | ICD-10-CM | POA: Insufficient documentation

## 2021-04-29 DIAGNOSIS — E039 Hypothyroidism, unspecified: Secondary | ICD-10-CM | POA: Diagnosis not present

## 2021-04-29 DIAGNOSIS — I110 Hypertensive heart disease with heart failure: Secondary | ICD-10-CM | POA: Insufficient documentation

## 2021-04-29 LAB — BASIC METABOLIC PANEL
Anion gap: 6 (ref 5–15)
BUN: 10 mg/dL (ref 6–20)
CO2: 29 mmol/L (ref 22–32)
Calcium: 9.3 mg/dL (ref 8.9–10.3)
Chloride: 101 mmol/L (ref 98–111)
Creatinine, Ser: 0.9 mg/dL (ref 0.44–1.00)
GFR, Estimated: >60 mL/min (ref >60)
Glucose, Bld: 93 mg/dL (ref 70–99)
Potassium: 3.7 mmol/L (ref 3.5–5.1)
Sodium: 136 mmol/L (ref 135–145)

## 2021-04-29 LAB — ECHOCARDIOGRAM COMPLETE
Area-P 1/2: 3.65 cm2
Calc EF: 19.2 %
S' Lateral: 5.2 cm
Single Plane A2C EF: 17.8 %
Single Plane A4C EF: 18.9 %

## 2021-04-29 LAB — BRAIN NATRIURETIC PEPTIDE: B Natriuretic Peptide: 96.1 pg/mL (ref 0.0–100.0)

## 2021-04-29 MED ORDER — METOPROLOL SUCCINATE ER 100 MG PO TB24: ORAL_TABLET | ORAL | 3 refills | Status: DC

## 2021-04-29 MED ORDER — ISOSORBIDE MONONITRATE ER 30 MG PO TB24: 15.0000 mg | ORAL_TABLET | Freq: Every day | ORAL | 3 refills | Status: DC

## 2021-04-29 NOTE — Progress Notes (Signed)
  Echocardiogram 2D Echocardiogram has been performed.  Suzanne Long 04/29/2021, 10:49 AM

## 2021-04-29 NOTE — Progress Notes (Signed)
Patient ID: Suzanne Long, female   DOB: 23-May-1971, 50 y.o.   MRN: [id number] PCP: [facility] Cardiology: Dr. Aundra Dubin  50 y.o. with history of HTN and nonischemic cardiomyopathy presents for followup of CHF.      She was diagnosed with CHF initially in 2011.  At that time, she was hospitalized with volume overload.  Coronary angiography in 2011 showed no significant CAD, but EF was about 25%.  BP was very high.  She was started on cardiac meds and BP improved.  Per her report, EF improved.  She eventually stopped all her medications.  She was then hospitalized in 11/16 with CHF exacerbation. EF was 30-35% on 11/16 echo and 30-35% on 2/17 echo.   Echo in 7/17 showed EF up to 40%.   She stopped her medications again.  She was then admitted in 3/22 with atrial flutter/RVR and CHF.  Unsure how long atrial flutter was present as she did not feel palpitations.  She had TEE-guided DCCV.  TEE showed EF 20-25% with severely decreased RV function, mild MR.  LHC in 3/22 showed no significant CAD.  Cardiac MRI in 6/22 showed LV EF 31%, RVEF 52%, basal-mid lateral subendocardial LGE (>50% wall thickness), septal mid-wall LGE.  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 She was seen by Dr. Broadus John, and this gene mutation was actually not thought to be the cause of DMD in her family (she likely has a mutation that was not picked up by the [facility] testing).   In 7/22, she had atrial flutter ablation.   Echo in 10/22 showed EF 20-25% with moderate LV dilation, normal RV, normal IVC.   Patient returns for followup of CHF.  She is in NSR today.  She does not feel palpitations.  Weight stable.  Not getting much exercise. No significant exertional dyspnea. Feels good overall.  No orthopnea/PND. No chest pain.  No lightheadedness.  Main complaint is headache related to Imdur.   Labs (3/17): K 3.7, creatinine 0.84, SPEP negative, TSH normal.  Labs (5/17): K 3.9, creatinine 0.81, BNP  normal Labs (3/22): K 5.2, creatinine 1.0 Labs (7/22): CK 1214 Labs (9/22): K 4.3, creatinine 1.04  ECG (personally reviewed): NSR, lateral TW flattening  PMH: 1. Chronic systolic CHF: 1st noted in 2011.  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  - LHC (2011) with no significant CAD, EF 25%.   - Echo (11/16) with EF 30-35%, diffuse hypokinesis. - Echo (2/17) with EF 30-35%, moderate LVH, diffuse hypokinesis.  - Echo (7/17) with EF 40%, severe LV dilation, grade II diastolic dysfunction.  - TEE (3/22) with EF 20-25% with severely decreased RV function, mild MR - LHC (3/22): No significant CAD.  - Cardiac MRI (6/22): LV EF 31%, RVEF 52%, basal-mid lateral subendocardial LGE (>50% wall thickness), septal mid-wall LGE.   - Echo (10/22): EF 20-25% with moderate LV dilation, normal RV, normal IVC.  2. HTN 3. Hypothyroidism 4. Obesity.  5. Goiter 6. Atrial flutter: 3/22, s/p DCCV.  - Ablation in 7/22.  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  8. PVCs: Zio monitor (8/22) with 8% PVCs, no AF/AFL  SH:  Nonsmoker.  Rare ETOH.  No drugs.    FH: Mother, grandmother with HTN.  Uncle with MI. 2 sons with Duchenne muscular dystrophy (both have passed away).  Brother with Duchenne muscular dystrophy.  ROS: All systems reviewed and negative except as per HPI.   Current Outpatient Medications  Medication Sig Dispense Refill   empagliflozin (JARDIANCE) 10 MG TABS tablet Take 1 tablet (10 mg total) by mouth daily. 90 tablet 3   furosemide (LASIX) 40 MG tablet Take 1 tablet (40 mg total) by mouth daily. 90 tablet 3   hydrALAZINE (APRESOLINE) 25 MG tablet Take 1 tablet (25 mg total) by mouth 3 (three) times daily. 90 tablet 11   polyvinyl alcohol (LIQUIFILM TEARS) 1.4 % ophthalmic solution Place 1 drop into both eyes 3 (three) times daily.      sacubitril-valsartan (ENTRESTO) 97-103 MG Take 1 tablet by mouth 2 (two) times daily. 60 tablet 6   spironolactone (ALDACTONE) 25 MG tablet Take 1 tablet (25 mg total) by mouth daily. 90 tablet 3   isosorbide mononitrate (IMDUR) 30 MG 24 hr tablet Take 0.5 tablets (15 mg total) by mouth daily. 45 tablet 3   metoprolol succinate (TOPROL XL) 100 MG 24 hr tablet Take 1 tablet (100 mg total) by mouth every morning AND 0.5 tablets (50 mg total) every evening. Take with or immediately following a meal.. 135 tablet 3   No current facility-administered medications for this encounter.   BP 110/60 (BP Location: Left Arm, Patient Position: Sitting)   Pulse 62   Wt 128 kg (282 lb 3.2 oz)   SpO2 99%   BMI 42.28 kg/m  General: NAD Neck: No JVD, no thyromegaly or thyroid nodule.  Lungs: Clear to auscultation bilaterally with normal respiratory effort. CV: Nondisplaced PMI.  Heart regular S1/S2, no S3/S4, no murmur.  No peripheral edema.  No carotid bruit.  Normal pedal pulses.  Abdomen: Soft, nontender, no hepatosplenomegaly, no distention.  Skin: Intact without lesions or rashes.  Neurologic: Alert and oriented x 3.  Psych: Normal affect. Extremities: No clubbing or cyanosis.  HEENT: Normal.   Assessment/Plan: 1. Chronic systolic CHF: Nonischemic cardiomyopathy. No CAD on coronary angiography in 2011 and 3/22.  She had 2 sons and a brother with Duchenne's muscular dystrophy.  She notes chronic muscle weakness with squat->stand.  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  TEE in 3/22 showed EF 20-25% with severe RV dysfunction.  Cardiac MRI in 6/22 showed LV EF 31%, RVEF 52%, basal-mid lateral subendocardial LGE (>50% wall thickness), septal mid-wall LGE.  LGE, especially in the inferolateral wall, is seen with DMD-related cardiomyopathy. Echo today showed EF 20-25%, moderate LV dilation, normal RV.  She is not volume overloaded on exam.  NYHA class  I-II symptoms. - She is taking Lasix 40 mg daily.  BMET/BNP today.  - Increase Toprol XL to 100 qam/50 qpm.    - Continue Entresto 97/103 bid.  - Continue spironolactone 25 mg daily. - Continue Jardiance 10 mg daily.   - Continue hydralazine 25 mg tid but with headaches, will decrease Imdur to 15 mg daily.  - EF remains low, refer to Dr. Curt Bears for ICD placement.  2. HTN: BP controlled.     3. H/o hypothyroidism: No thyroid replacement. Following with endocrinology for goiter. 4. Obesity: We discussed diet and exercise for weight loss again.   5. Atrial flutter: Typical flutter in 3/22 with CHF decompensation.  Now s/p DCCV back to NSR and atrial flutter ablation, she is in NSR today.  - She is now off apixaban.   6. DMD heterozygote: I suspect this is the cause of her mild chronic muscle weakness. CPX  mildly elevated.  7. PVCs: Frequent on 8/22 Zio monitor, 8% total.  - Not high enough percentage to warrant amiodarone.  - Increasing Toprol XL as above.   Followup in 3 months  Loralie Champagne 04/29/2021

## 2021-04-29 NOTE — Patient Instructions (Addendum)
EKG done today.  Labs done today. We will contact you only if your labs are abnormal.  DECREASE Imdur to $Remove'15mg'ADKLDGu$  (1/2 tablet) by mouth daily.  INCREASE Metoprolol to $RemoveBefor'100mg'DQAxspWhpgUE$  (1 tablet) by mouth every morning and $RemoveBef'50mg'WpXqscQjjC$  (1/2 tablet) by mouth every evening.   No other medication changes were made. Please continue all current medications as prescribed.  You have been referred to Electrophysiology. They will contact you to schedule an appointment.   Your physician recommends that you schedule a follow-up appointment in: 3 months  If you have any questions or concerns before your next appointment please send [place] a message through [place] or call our office at [phone].    TO LEAVE A MESSAGE FOR THE NURSE SELECT OPTION 2, PLEASE LEAVE A MESSAGE INCLUDING: YOUR NAME DATE OF BIRTH CALL BACK NUMBER REASON FOR CALL**this is important as we prioritize the call backs  YOU WILL RECEIVE A CALL BACK THE SAME DAY AS LONG AS YOU CALL BEFORE 4:00 PM   Do the following things EVERYDAY: Weigh yourself in the morning before breakfast. Write it down and keep it in a log. Take your medicines as prescribed Eat low salt foods--Limit salt (sodium) to 2000 mg per day.  Stay as active as you can everyday Limit all fluids for the day to less than 2 liters   At the [facility], you and your health needs are our priority. As part of our continuing mission to provide you with exceptional heart care, we have created designated Provider Care Teams. These Care Teams include your primary Cardiologist (physician) and Advanced Practice Providers (APPs- Physician Assistants and Nurse Practitioners) who all work together to provide you with the care you need, when you need it.   You may see any of the following providers on your designated Care Team at your next follow up: Dr Glori Bickers Dr Haynes Kerns, NP Lyda Jester, [place] Audry Riles, PharmD   Please be sure to bring in all  your medications bottles to every appointment.

## 2021-05-08 ENCOUNTER — Ambulatory Visit: Admitting: Family Medicine

## 2021-06-05 ENCOUNTER — Ambulatory Visit: Admitting: Cardiology

## 2021-06-05 NOTE — Progress Notes (Deleted)
Electrophysiology Office Note   Date:  76/72/0947   ID:  Suzanne Long, DOB [birth year], MRN [id number]  PCP:  Pcp, No  Cardiologist:  Aundra Dubin Primary Electrophysiologist:  Timonthy Hovater Meredith Leeds, MD    Chief Complaint: Atrial flutter   History of Present Illness: Suzanne Long is a 50 y.o. female who is being seen today for the evaluation of atrial flutter at the request of Larey Dresser, MD. Presenting today for electrophysiology evaluation.  She has a history significant for chronic systolic heart failure due to nonischemic cardiomyopathy, hypertension, obesity, and atrial flutter.  She is status post atrial flutter ablation 01/22/2021.  On her initial echo, her ejection fraction was found to be 20 to 25%.  Repeat echo shows a stable ejection fraction of 20 to 25%.  She was having PVCs with a cardiac monitor showing an 8% burden.  Today, denies symptoms of palpitations, chest pain, shortness of breath, orthopnea, PND, lower extremity edema, claudication, dizziness, presyncope, syncope, bleeding, or neurologic sequela. The patient is tolerating medications without difficulties. ***    Past Medical History:  Diagnosis Date   CHF (congestive heart failure) (HCC)    Dyspnea    Hypertension    Hypothyroidism    Joint pain    Multiple food allergies    NICM (nonischemic cardiomyopathy) ([place])    a. Echo 2/17:  moderate LVH, EF 30-35%, diffuse HK, moderate LAE   NICM (nonischemic cardiomyopathy) ([place]) 09/26/2020   Non-ST elevation (NSTEMI) myocardial infarction ([place]) 09/26/2020   Past Surgical History:  Procedure Laterality Date   A-FLUTTER ABLATION N/A 01/22/2021   Procedure: A-FLUTTER ABLATION;  Surgeon: Constance Haw, MD;  Location: [place] [facility];  Service: Cardiovascular;  Laterality: N/A;   BUBBLE STUDY  09/26/2020   Procedure: BUBBLE STUDY;  Surgeon: Freada Bergeron, MD;  Location: [place];  Service: Cardiovascular;;   CARDIOVERSION N/A 09/26/2020    Procedure: CARDIOVERSION;  Surgeon: Freada Bergeron, MD;  Location: [place];  Service: Cardiovascular;  Laterality: N/A;   LEFT HEART CATH AND CORONARY ANGIOGRAPHY N/A 09/24/2020   Procedure: LEFT HEART CATH AND CORONARY ANGIOGRAPHY;  Surgeon: Lorretta Harp, MD;  Location: [place] [facility];  Service: Cardiovascular;  Laterality: N/A;   MYOMECTOMY     None     TEE WITHOUT CARDIOVERSION N/A 09/26/2020   Procedure: TRANSESOPHAGEAL ECHOCARDIOGRAM (TEE);  Surgeon: Freada Bergeron, MD;  Location: [facility];  Service: Cardiovascular;  Laterality: N/A;     Current Outpatient Medications  Medication Sig Dispense Refill   empagliflozin (JARDIANCE) 10 MG TABS tablet Take 1 tablet (10 mg total) by mouth daily. 90 tablet 3   furosemide (LASIX) 40 MG tablet Take 1 tablet (40 mg total) by mouth daily. 90 tablet 3   hydrALAZINE (APRESOLINE) 25 MG tablet Take 1 tablet (25 mg total) by mouth 3 (three) times daily. 90 tablet 11   isosorbide mononitrate (IMDUR) 30 MG 24 hr tablet Take 0.5 tablets (15 mg total) by mouth daily. 45 tablet 3   metoprolol succinate (TOPROL XL) 100 MG 24 hr tablet Take 1 tablet (100 mg total) by mouth every morning AND 0.5 tablets (50 mg total) every evening. Take with or immediately following a meal.. 135 tablet 3   polyvinyl alcohol (LIQUIFILM TEARS) 1.4 % ophthalmic solution Place 1 drop into both eyes 3 (three) times daily.     sacubitril-valsartan (ENTRESTO) 97-103 MG Take 1 tablet by mouth 2 (two) times daily. 60 tablet 6   spironolactone (ALDACTONE) 25  MG tablet Take 1 tablet (25 mg total) by mouth daily. 90 tablet 3   No current facility-administered medications for this visit.    Allergies:   Shellfish allergy, Gadolinium derivatives, Iodine, and Latex   Social History:  The patient  reports that she has never smoked. She has never used smokeless tobacco. She reports current alcohol use. She reports that she does not use drugs.   Family History:  The  patient's family history includes Depression in her mother; Diabetes in her mother; Hypertension in her mother; Kidney failure in her brother; Muscular dystrophy in her brother and son; Obesity in her mother.   ROS:  Please see the history of present illness.   Otherwise, review of systems is positive for none.   All other systems are reviewed and negative.   PHYSICAL EXAM: VS:  There were no vitals taken for this visit. , BMI There is no height or weight on file to calculate BMI. GEN: Well nourished, well developed, in no acute distress  HEENT: normal  Neck: no JVD, carotid bruits, or masses Cardiac: ***RRR; no murmurs, rubs, or gallops,no edema  Respiratory:  clear to auscultation bilaterally, normal work of breathing GI: soft, nontender, nondistended, + BS MS: no deformity or atrophy  Skin: warm and dry Neuro:  Strength and sensation are intact Psych: euthymic mood, full affect  EKG:  EKG {ACTION; IS/IS [id number]} ordered today. Personal review of the ekg ordered *** shows ***   Recent Labs: 09/22/2020: Magnesium 1.9; TSH 4.445 01/16/2021: Hemoglobin 14.2; Platelets 261 04/29/2021: B Natriuretic Peptide 96.1; BUN 10; Creatinine, Ser 0.90; Potassium 3.7; Sodium 136    Lipid Panel     Component Value Date/Time   CHOL 170 09/23/2020 0433   CHOL 175 05/31/2019 1140   TRIG 80 09/23/2020 0433   HDL 45 09/23/2020 0433   HDL 51 05/31/2019 1140   CHOLHDL 3.8 09/23/2020 0433   VLDL 16 09/23/2020 0433   LDLCALC 109 (H) 09/23/2020 0433   LDLCALC 112 (H) 05/31/2019 1140     Wt Readings from Last 3 Encounters:  04/29/21 282 lb 3.2 oz (128 kg)  03/05/21 285 lb 6.4 oz (129.5 kg)  02/26/21 281 lb (127.5 kg)      Other studies Reviewed: Additional studies/ records that were reviewed today include: Left heart catheterization 09/24/2020 Review of the above records today demonstrates:  There is severe left ventricular systolic dysfunction. LV end diastolic pressure is mildly  elevated. The left ventricular ejection fraction is less than 25% by visual estimate.  TTE 04/29/21  1. Left ventricular ejection fraction, by estimation, is 20 to 25%. The  left ventricle has severely decreased function. The left ventricle  demonstrates global hypokinesis. The left ventricular internal cavity size  was moderately dilated. Left  ventricular diastolic parameters are consistent with Grade I diastolic  dysfunction (impaired relaxation). The average left ventricular global  longitudinal strain is -13.7 %. The global longitudinal strain is  abnormal.   2. Right ventricular systolic function is normal. The right ventricular  size is normal. Tricuspid regurgitation signal is inadequate for assessing  PA pressure.   3. The mitral valve is normal in structure. Trivial mitral valve  regurgitation. No evidence of mitral stenosis.   4. The aortic valve is tricuspid. Aortic valve regurgitation is not  visualized. No aortic stenosis is present.   5. The inferior vena cava is normal in size with greater than 50%  respiratory variability, suggesting right atrial pressure of 3 mmHg.   Cardiac  monitor 03/12/2021 personally reviewed Max 169 bpm 04:57pm, 08/24 Min 46 bpm 08:00am, 08/22 Avg 60 bpm Predomiant rhythm was sinus rhythm <1% supraventricular ectopy 8.8% ventricular ectopy No prolonged atrial or ventricular arrhythmias of more than 15 beats Symptoms of chest pressure associated with sinus rhythm  ASSESSMENT AND PLAN:  1.  Typical atrial flutter: Currently on Eliquis.  CHA2DS2-VASc 2.  Status post ablation 01/22/2021.  Has remained in sinus rhythm.  Eliquis has been stopped.  2.  Chronic systolic heart failure due to nonischemic cardiomyopathy: Currently on Lasix, Toprol-XL, Entresto, Aldactone, Jardiance.***  3.  PVCs: Cardiac monitor with an 8.8% burden.***  Current medicines are reviewed at length with the patient today.   The patient does not have concerns regarding  her medicines.  The following changes were made today: ***  Labs/ tests ordered today include:  No orders of the defined types were placed in this encounter.     Disposition:   FU with Keyri Salberg *** months  Signed, Emeline Simpson Meredith Leeds, MD  06/05/2021 8:32 AM     [facility] [address] [place] [address] [phone] (office) [phone] (fax)

## 2021-06-19 ENCOUNTER — Other Ambulatory Visit: Payer: Self-pay

## 2021-06-19 ENCOUNTER — Emergency Department (HOSPITAL_COMMUNITY)
Admission: EM | Admit: 2021-06-19 | Discharge: 2021-06-19 | Disposition: A | Discharge status: Home / Self Care | Attending: Emergency Medicine | Admitting: Emergency Medicine

## 2021-06-19 DIAGNOSIS — E860 Dehydration: Secondary | ICD-10-CM | POA: Diagnosis not present

## 2021-06-19 DIAGNOSIS — E038 Other specified hypothyroidism: Secondary | ICD-10-CM | POA: Diagnosis not present

## 2021-06-19 DIAGNOSIS — I471 Supraventricular tachycardia: Secondary | ICD-10-CM | POA: Diagnosis present

## 2021-06-19 DIAGNOSIS — I110 Hypertensive heart disease with heart failure: Secondary | ICD-10-CM | POA: Insufficient documentation

## 2021-06-19 DIAGNOSIS — Z79899 Other long term (current) drug therapy: Secondary | ICD-10-CM | POA: Insufficient documentation

## 2021-06-19 DIAGNOSIS — I5043 Acute on chronic combined systolic (congestive) and diastolic (congestive) heart failure: Secondary | ICD-10-CM | POA: Diagnosis not present

## 2021-06-19 DIAGNOSIS — Z91040 Latex allergy status: Secondary | ICD-10-CM | POA: Insufficient documentation

## 2021-06-19 LAB — CBC WITH DIFFERENTIAL/PLATELET
Abs Immature Granulocytes: 0.01 10*3/uL (ref 0.00–0.07)
Basophils Absolute: 0 10*3/uL (ref 0.0–0.1)
Basophils Relative: 0 %
Eosinophils Absolute: 0.1 10*3/uL (ref 0.0–0.5)
Eosinophils Relative: 2 %
HCT: 45.1 % (ref 36.0–46.0)
Hemoglobin: 14.4 g/dL (ref 12.0–15.0)
Immature Granulocytes: 0 %
Lymphocytes Relative: 35 %
Lymphs Abs: 1.8 10*3/uL (ref 0.7–4.0)
MCH: 30.2 pg (ref 26.0–34.0)
MCHC: 31.9 g/dL (ref 30.0–36.0)
MCV: 94.5 fL (ref 80.0–100.0)
Monocytes Absolute: 0.3 10*3/uL (ref 0.1–1.0)
Monocytes Relative: 5 %
Neutro Abs: 3 10*3/uL (ref 1.7–7.7)
Neutrophils Relative %: 58 %
Platelets: 283 10*3/uL (ref 150–400)
RBC: 4.77 MIL/uL (ref 3.87–5.11)
RDW: 12.8 % (ref 11.5–15.5)
WBC: 5.3 10*3/uL (ref 4.0–10.5)
nRBC: 0 % (ref 0.0–0.2)

## 2021-06-19 LAB — COMPREHENSIVE METABOLIC PANEL
ALT: 30 U/L (ref 0–44)
AST: 37 U/L (ref 15–41)
Albumin: 3.3 g/dL — ABNORMAL LOW (ref 3.5–5.0)
Alkaline Phosphatase: 96 U/L (ref 38–126)
Anion gap: 13 (ref 5–15)
BUN: 12 mg/dL (ref 6–20)
CO2: 21 mmol/L — ABNORMAL LOW (ref 22–32)
Calcium: 8.7 mg/dL — ABNORMAL LOW (ref 8.9–10.3)
Chloride: 105 mmol/L (ref 98–111)
Creatinine, Ser: 1.25 mg/dL — ABNORMAL HIGH (ref 0.44–1.00)
GFR, Estimated: 53 mL/min — ABNORMAL LOW (ref >60)
Glucose, Bld: 124 mg/dL — ABNORMAL HIGH (ref 70–99)
Potassium: 4 mmol/L (ref 3.5–5.1)
Sodium: 139 mmol/L (ref 135–145)
Total Bilirubin: 0.6 mg/dL (ref 0.3–1.2)
Total Protein: 6.6 g/dL (ref 6.5–8.1)

## 2021-06-19 LAB — BRAIN NATRIURETIC PEPTIDE: B Natriuretic Peptide: 160.8 pg/mL — ABNORMAL HIGH (ref 0.0–100.0)

## 2021-06-19 LAB — MAGNESIUM: Magnesium: 1.8 mg/dL (ref 1.7–2.4)

## 2021-06-19 NOTE — ED Triage Notes (Signed)
Pt  bib ems from home c/o weakness, dizziness, and sob. Upon arrival ems noticed pt diaphoretic with absent radial and HR 250 SVT. Pt was given a total of 18 mg of Adenosine that converted pt to HR 120. Pt received 400 ml NS.   BP 156/86 HR 120 RA 96 CBG 195

## 2021-06-19 NOTE — ED Provider Notes (Signed)
[facility] EMERGENCY DEPARTMENT Provider Note   CSN: [id number] Arrival date & time:        History Chief Complaint  Patient presents with   Dizziness    SVT    Suzanne Long is a 50 y.o. female.  HPI Patient presents via EMS with concern for an episode of supraventricular tachycardia. She states that she was in her usual state of health until about an hour ago.  Without obvious precipitant she began feeling lightheaded, palpitations, EMS reports that on their arrival the patient had a heart rate in the 250 range.  Patient received adenosine 6 mg, then 12 mg, with eventual conversion to sinus rhythm, though with tachycardia.  Currently she denies complaints, pain, states that she feels good. She acknowledges ongoing work-up for history of SVT, new this year.  On chart review patient has a noted history of nonischemic cardiomyopathy, last echo 2 months ago, results below: IMPRESSIONS     1. Left ventricular ejection fraction, by estimation, is 20 to 25%. The  left ventricle has severely decreased function. The left ventricle  demonstrates global hypokinesis. The left ventricular internal cavity size  was moderately dilated. Left  ventricular diastolic parameters are consistent with Grade I diastolic  dysfunction (impaired relaxation). The average left ventricular global  longitudinal strain is -13.7 %. The global longitudinal strain is  abnormal.   2. Right ventricular systolic function is normal. The right ventricular  size is normal. Tricuspid regurgitation signal is inadequate for assessing  PA pressure.   3. The mitral valve is normal in structure. Trivial mitral valve  regurgitation. No evidence of mitral stenosis.   4. The aortic valve is tricuspid. Aortic valve regurgitation is not  visualized. No aortic stenosis is present.   5. The inferior vena cava is normal in size with greater than 50%  respiratory variability, suggesting right atrial pressure  of 3 mmHg.      Past Medical History:  Diagnosis Date   CHF (congestive heart failure) (HCC)    Dyspnea    Hypertension    Hypothyroidism    Joint pain    Multiple food allergies    NICM (nonischemic cardiomyopathy) (HCC)    a. Echo 2/17:  moderate LVH, EF 30-35%, diffuse HK, moderate LAE   NICM (nonischemic cardiomyopathy) (HCC) 09/26/2020   Non-ST elevation (NSTEMI) myocardial infarction (HCC) 09/26/2020    Patient Active Problem List   Diagnosis Date Noted   Non-ST elevation (NSTEMI) myocardial infarction (HCC) 09/26/2020   Anticoagulation adequate 09/26/2020   NICM (nonischemic cardiomyopathy) (HCC) 09/26/2020   Atrial flutter (HCC)    Insulin resistance 07/22/2019   Other hyperlipidemia 07/22/2019   Vitamin D deficiency 07/22/2019   Goiter 02/01/2016   Chronic systolic CHF (congestive heart failure) (HCC) 11/22/2015   Elevated troponin 05/29/2015   CHF exacerbation (HCC) 05/29/2015   Accelerated hypertension    Noncompliance with medication regimen    Papilledema 07/27/2014   Acute on chronic systolic and diastolic heart failure, NYHA class 1 (HCC) 02/09/2014   Essential hypertension 02/09/2014   Class 3 severe obesity with serious comorbidity and body mass index (BMI) of 40.0 to 44.9 in adult (HCC) 02/09/2014   Subclinical hypothyroidism 02/09/2014    Past Surgical History:  Procedure Laterality Date   A-FLUTTER ABLATION N/A 01/22/2021   Procedure: A-FLUTTER ABLATION;  Surgeon: Regan Lemming, MD;  Location: [facility];  Service: Cardiovascular;  Laterality: N/A;   BUBBLE STUDY  09/26/2020   Procedure: BUBBLE STUDY;  Surgeon: Freada Bergeron, MD;  Location: [place];  Service: Cardiovascular;;   CARDIOVERSION N/A 09/26/2020   Procedure: CARDIOVERSION;  Surgeon: Freada Bergeron, MD;  Location: [place];  Service: Cardiovascular;  Laterality: N/A;   LEFT HEART CATH AND CORONARY ANGIOGRAPHY N/A 09/24/2020   Procedure: LEFT HEART CATH AND  CORONARY ANGIOGRAPHY;  Surgeon: Lorretta Harp, MD;  Location: [place] [facility];  Service: Cardiovascular;  Laterality: N/A;   MYOMECTOMY     None     TEE WITHOUT CARDIOVERSION N/A 09/26/2020   Procedure: TRANSESOPHAGEAL ECHOCARDIOGRAM (TEE);  Surgeon: Freada Bergeron, MD;  Location: [facility];  Service: Cardiovascular;  Laterality: N/A;     OB History     Gravida  2   Para      Term      Preterm      AB      Living  2      SAB      IAB      Ectopic      Multiple      Live Births              Family History  Problem Relation Age of Onset   Diabetes Mother    Hypertension Mother    Depression Mother    Obesity Mother    Muscular dystrophy Brother    Kidney failure Brother    Muscular dystrophy Son    Migraines Neg Hx    Multiple sclerosis Neg Hx    Thyroid disease Neg Hx     Social History   Tobacco Use   Smoking status: Never   Smokeless tobacco: Never  Vaping Use   Vaping Use: Never used  Substance Use Topics   Alcohol use: Yes    Alcohol/week: 0.0 standard drinks    Comment: Occasional   Drug use: No    Home Medications Prior to Admission medications   Medication Sig Start Date End Date Taking? Authorizing Provider  empagliflozin (JARDIANCE) 10 MG TABS tablet Take 1 tablet (10 mg total) by mouth daily. 12/11/20  Yes Camnitz, Will Hassell Done, MD  furosemide (LASIX) 40 MG tablet Take 1 tablet (40 mg total) by mouth daily. 03/05/21  Yes Larey Dresser, MD  hydrALAZINE (APRESOLINE) 25 MG tablet Take 1 tablet (25 mg total) by mouth 3 (three) times daily. 03/05/21 06/19/21 Yes Larey Dresser, MD  isosorbide mononitrate (IMDUR) 30 MG 24 hr tablet Take 0.5 tablets (15 mg total) by mouth daily. Patient taking differently: Take 30 mg by mouth daily. 04/29/21  Yes Larey Dresser, MD  metoprolol succinate (TOPROL-XL) 100 MG 24 hr tablet Take 50-100 mg by mouth See admin instructions. Take one tablet by mouth in the morning, then take half  tablet by mouth in the evening per patient   Yes [provider]  Multiple Vitamin (MULTIVITAMIN WITH MINERALS) TABS tablet Take 1 tablet by mouth daily.   Yes [provider]  polyvinyl alcohol (LIQUIFILM TEARS) 1.4 % ophthalmic solution Place 1 drop into both eyes 3 (three) times daily.   Yes [provider]  sacubitril-valsartan (ENTRESTO) 97-103 MG Take 1 tablet by mouth 2 (two) times daily. 01/23/21  Yes Larey Dresser, MD  spironolactone (ALDACTONE) 25 MG tablet Take 1 tablet (25 mg total) by mouth daily. 12/31/20  Yes Larey Dresser, MD  metoprolol succinate (TOPROL XL) 100 MG 24 hr tablet Take 1 tablet (100 mg total) by mouth every morning AND 0.5 tablets (50 mg  total) every evening. Take with or immediately following a meal.. Patient not taking: Reported on 06/19/2021 04/29/21   Laurey Morale, MD    Allergies    Shellfish allergy, Gadolinium derivatives, Iodine, and Latex  Review of Systems   Review of Systems  Constitutional:        Per HPI, otherwise negative  HENT:         Per HPI, otherwise negative  Respiratory:         Per HPI, otherwise negative  Cardiovascular:        Per HPI, otherwise negative  Gastrointestinal:  Negative for vomiting.  Endocrine:       Negative aside from HPI  Genitourinary:        Neg aside from HPI   Musculoskeletal:        Per HPI, otherwise negative  Skin: Negative.   Neurological:  Negative for syncope.   Physical Exam Updated Vital Signs BP 94/81   Pulse (!) 133   Temp 97.7 F (36.5 C) (Oral)   Resp 15   Ht 5\' 8"  (1.727 m)   Wt 127 kg   SpO2 99%   BMI 42.57 kg/m   Physical Exam Vitals and nursing note reviewed.  Constitutional:      General: She is not in acute distress.    Appearance: She is well-developed.  HENT:     Head: Normocephalic and atraumatic.  Eyes:     Conjunctiva/sclera: Conjunctivae normal.  Cardiovascular:     Rate and Rhythm: Regular rhythm. Tachycardia present.   Pulmonary:     Effort: Pulmonary effort is normal. No respiratory distress.     Breath sounds: Normal breath sounds. No stridor.  Abdominal:     General: There is no distension.  Skin:    General: Skin is warm and dry.  Neurological:     Mental Status: She is alert and oriented to person, place, and time.     Cranial Nerves: No cranial nerve deficit.    ED Results / Procedures / Treatments   Labs (all labs ordered are listed, but only abnormal results are displayed) Labs Reviewed  COMPREHENSIVE METABOLIC PANEL - Abnormal; Notable for the following components:      Result Value   CO2 21 (*)    Glucose, Bld 124 (*)    Creatinine, Ser 1.25 (*)    Calcium 8.7 (*)    Albumin 3.3 (*)    GFR, Estimated 53 (*)    All other components within normal limits  BRAIN NATRIURETIC PEPTIDE - Abnormal; Notable for the following components:   B Natriuretic Peptide 160.8 (*)    All other components within normal limits  CBC WITH DIFFERENTIAL/PLATELET  MAGNESIUM    EMS rhythm strip reviewed, notable for SVT, rate 200+.  EKG EKG Interpretation  Date/Time:  Wednesday June 19 2021 18:18:37 EST Ventricular Rate:  116 PR Interval:  145 QRS Duration: 168 QT Interval:  401 QTC Calculation: 558 R Axis:   -32 Text Interpretation: Sinus tachycardia Multiform ventricular premature complexes Right bundle branch block LVH with secondary repolarization abnormality Abnormal ECG Confirmed by [id number] [phone]) on 06/19/2021 6:32:20 PM  Procedures Procedures   Medications Ordered in ED Medications - No data to display  ED Course  I have reviewed the triage vital signs and the nursing notes.  Pertinent labs & imaging results that were available during my care of the patient were reviewed by me and considered in my medical decision making (see chart for details).  Chart review as above notable for CHF, SVT.   8:44 PM On repeat exam patient is awake, alert, in no distress.  She is  accompanied by female companion.  We discussed all findings again.  Specifically we discussed findings consistent with mild dehydration, otherwise reassuring, no leukocytosis, and here no fever, no complaints.  She does have mild tachycardia, 110, sinus, otherwise unremarkable.  Patient encouraged to optimize fluid intake, discharged in stable condition.  Message sent to her cardiologist to facilitate follow-up. MDM Rules/Calculators/A&P MDM Number of Diagnoses or Management Options Dehydration: new, needed workup SVT (supraventricular tachycardia) ([place]): established, worsening   Amount and/or Complexity of Data Reviewed Clinical lab tests: ordered and reviewed Tests in the medicine section of CPT: reviewed and ordered Decide to obtain previous medical records or to obtain history from someone other than the patient: yes Obtain history from someone other than the patient: yes Review and summarize past medical records: yes  Risk of Complications, Morbidity, and/or Mortality Presenting problems: high Diagnostic procedures: high Management options: high  Critical Care Total time providing critical care: < 30 minutes  Patient Progress Patient progress: improved   Final Clinical Impression(s) / ED Diagnoses Final diagnoses:  SVT (supraventricular tachycardia) ([place])  Dehydration     Carmin Muskrat, MD 06/19/21 2046

## 2021-06-19 NOTE — Discharge Instructions (Signed)
As discussed, your evaluation today has been largely reassuring.  But, it is important that you monitor your condition carefully, and do not hesitate to return to the ED if you develop new, or concerning changes in your condition.  Otherwise, please follow-up with your physician for appropriate ongoing care.  In the interim, please monitor your hydration status.

## 2021-06-20 ENCOUNTER — Other Ambulatory Visit (HOSPITAL_COMMUNITY): Payer: Self-pay | Admitting: *Deleted

## 2021-06-20 DIAGNOSIS — I483 Typical atrial flutter: Secondary | ICD-10-CM

## 2021-06-20 DIAGNOSIS — I5022 Chronic systolic (congestive) heart failure: Secondary | ICD-10-CM

## 2021-07-05 ENCOUNTER — Other Ambulatory Visit (HOSPITAL_COMMUNITY): Payer: Self-pay | Admitting: Cardiology

## 2021-07-05 ENCOUNTER — Telehealth (HOSPITAL_COMMUNITY): Payer: Self-pay

## 2021-07-05 ENCOUNTER — Ambulatory Visit (HOSPITAL_COMMUNITY)
Admission: RE | Admit: 2021-07-05 | Discharge: 2021-07-05 | Disposition: A | Discharge status: Home / Self Care | Source: Ambulatory Visit | Attending: Cardiology | Admitting: Cardiology

## 2021-07-05 DIAGNOSIS — R000 Tachycardia, unspecified: Secondary | ICD-10-CM

## 2021-07-05 MED ORDER — ENTRESTO 49-51 MG PO TABS: 1.0000 | ORAL_TABLET | Freq: Two times a day (BID) | ORAL | 3 refills | Status: DC

## 2021-07-05 NOTE — Telephone Encounter (Signed)
Spoke with patient about her dizziness. Spoke with NP [facility] and informed patient of changes to medication and plan to investigate further. Advised patient if dizziness becomes more frequent and blood pressure drops further to make sure she goes to the emergency room.  Plan: decrease entresto to 49/51 and have Zio patch applied for 14 days.  Pt aware, agreeable, and verbalized understanding

## 2021-07-11 ENCOUNTER — Other Ambulatory Visit (HOSPITAL_COMMUNITY): Payer: Self-pay | Admitting: Cardiology

## 2021-07-11 ENCOUNTER — Other Ambulatory Visit: Payer: Self-pay

## 2021-07-11 ENCOUNTER — Telehealth (HOSPITAL_COMMUNITY): Payer: Self-pay

## 2021-07-11 ENCOUNTER — Emergency Department (HOSPITAL_COMMUNITY)
Admission: EM | Admit: 2021-07-11 | Discharge: 2021-07-11 | Disposition: A | Discharge status: Home / Self Care | Attending: Emergency Medicine | Admitting: Emergency Medicine

## 2021-07-11 ENCOUNTER — Ambulatory Visit (HOSPITAL_COMMUNITY)
Admission: RE | Admit: 2021-07-11 | Discharge: 2021-07-11 | Disposition: A | Discharge status: Home / Self Care | Source: Ambulatory Visit | Attending: Cardiology | Admitting: Cardiology

## 2021-07-11 ENCOUNTER — Other Ambulatory Visit (HOSPITAL_COMMUNITY): Payer: Self-pay

## 2021-07-11 DIAGNOSIS — I5043 Acute on chronic combined systolic (congestive) and diastolic (congestive) heart failure: Secondary | ICD-10-CM | POA: Insufficient documentation

## 2021-07-11 DIAGNOSIS — I471 Supraventricular tachycardia, unspecified: Secondary | ICD-10-CM

## 2021-07-11 DIAGNOSIS — R002 Palpitations: Secondary | ICD-10-CM

## 2021-07-11 DIAGNOSIS — Z91040 Latex allergy status: Secondary | ICD-10-CM | POA: Insufficient documentation

## 2021-07-11 DIAGNOSIS — E039 Hypothyroidism, unspecified: Secondary | ICD-10-CM

## 2021-07-11 DIAGNOSIS — Z7982 Long term (current) use of aspirin: Secondary | ICD-10-CM | POA: Diagnosis not present

## 2021-07-11 DIAGNOSIS — I110 Hypertensive heart disease with heart failure: Secondary | ICD-10-CM | POA: Insufficient documentation

## 2021-07-11 LAB — T4, FREE: Free T4: 0.98 ng/dL (ref 0.61–1.12)

## 2021-07-11 LAB — CBC WITH DIFFERENTIAL/PLATELET
Abs Immature Granulocytes: 0.01 10*3/uL (ref 0.00–0.07)
Basophils Absolute: 0 10*3/uL (ref 0.0–0.1)
Basophils Relative: 0 %
Eosinophils Absolute: 0.1 10*3/uL (ref 0.0–0.5)
Eosinophils Relative: 2 %
HCT: 40.9 % (ref 36.0–46.0)
Hemoglobin: 13 g/dL (ref 12.0–15.0)
Immature Granulocytes: 0 %
Lymphocytes Relative: 34 %
Lymphs Abs: 1.8 10*3/uL (ref 0.7–4.0)
MCH: 29.7 pg (ref 26.0–34.0)
MCHC: 31.8 g/dL (ref 30.0–36.0)
MCV: 93.4 fL (ref 80.0–100.0)
Monocytes Absolute: 0.4 10*3/uL (ref 0.1–1.0)
Monocytes Relative: 8 %
Neutro Abs: 3 10*3/uL (ref 1.7–7.7)
Neutrophils Relative %: 56 %
Platelets: 263 10*3/uL (ref 150–400)
RBC: 4.38 MIL/uL (ref 3.87–5.11)
RDW: 13.1 % (ref 11.5–15.5)
WBC: 5.3 10*3/uL (ref 4.0–10.5)
nRBC: 0 % (ref 0.0–0.2)

## 2021-07-11 LAB — COMPREHENSIVE METABOLIC PANEL
ALT: 21 U/L (ref 0–44)
AST: 25 U/L (ref 15–41)
Albumin: 3.1 g/dL — ABNORMAL LOW (ref 3.5–5.0)
Alkaline Phosphatase: 91 U/L (ref 38–126)
Anion gap: 7 (ref 5–15)
BUN: 12 mg/dL (ref 6–20)
CO2: 24 mmol/L (ref 22–32)
Calcium: 8.8 mg/dL — ABNORMAL LOW (ref 8.9–10.3)
Chloride: 107 mmol/L (ref 98–111)
Creatinine, Ser: 1.14 mg/dL — ABNORMAL HIGH (ref 0.44–1.00)
GFR, Estimated: 59 mL/min — ABNORMAL LOW (ref >60)
Glucose, Bld: 132 mg/dL — ABNORMAL HIGH (ref 70–99)
Potassium: 3.6 mmol/L (ref 3.5–5.1)
Sodium: 138 mmol/L (ref 135–145)
Total Bilirubin: 0.5 mg/dL (ref 0.3–1.2)
Total Protein: 6.4 g/dL — ABNORMAL LOW (ref 6.5–8.1)

## 2021-07-11 LAB — TSH: TSH: 6.624 u[IU]/mL — ABNORMAL HIGH (ref 0.350–4.500)

## 2021-07-11 LAB — MAGNESIUM: Magnesium: 1.7 mg/dL (ref 1.7–2.4)

## 2021-07-11 MED ORDER — CARVEDILOL 3.125 MG PO TABS
3.1250 mg | ORAL_TABLET | Freq: Once | ORAL | Status: AC
  Administered 2021-07-11: 05:00:00 3.125 mg via ORAL
  Filled 2021-07-11: qty 1

## 2021-07-11 MED ORDER — CARVEDILOL 3.125 MG PO TABS: 6.2500 mg | ORAL_TABLET | Freq: Two times a day (BID) | ORAL | 3 refills | Status: DC

## 2021-07-11 MED ORDER — CARVEDILOL 3.125 MG PO TABS: 3.1250 mg | ORAL_TABLET | Freq: Two times a day (BID) | ORAL | 1 refills | Status: DC

## 2021-07-11 MED ORDER — LACTATED RINGERS IV BOLUS
1000.0000 mL | Freq: Once | INTRAVENOUS | Status: AC
  Administered 2021-07-11: 03:00:00 1000 mL via INTRAVENOUS

## 2021-07-11 NOTE — ED Provider Notes (Signed)
[facility] EMERGENCY DEPARTMENT Provider Note   CSN: [id number] Arrival date & time: 07/11/21  0140     History Chief Complaint  Patient presents with   SVT    Suzanne Long is a 50 y.o. female.  50 year old female that presents emerged from today with recurrent SVT.  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  Her EF ranges from 25 to 40%.  She sees Dr. Marigene Ehlers for this.  She is on multiple medications related to her heart failure.  Sounds like about a month ago she had her first episode of SVT.  She had another episode a couple weeks ago and another episode on Christmas and then now today.  The episode today she is in her normal state of health and she felt lightheaded and felt palpitations called EMS apparently heart rate was in the 260 range.  Unclear what her blood pressure was.  They tried 6, 6 and then 12 of adenosine without result.  They then gave her fentanyl and did a electrocardioversion at 100 which resolved the a tachycardia.  Patient is asymptomatic on arrival here.  She states that she has not really changed her diet at all recently.  She has no new medications.  No recent medication changes.  She states that she does have some low-level hyperthyroidism but has not any medication for it.  No recent weight loss or weight gain.  No over-the-counter medications aside from [employer]'s children's cough medicine.  No weight loss or weight gain recently.  She does state that she has symptoms related taken to metoprolol.  She feels like 30 to 40 minutes after taking it she will have some lightheadedness and feel like she is in a pass out.  Her systolic blood pressure drops by about 30-35 points during this time as well.    Past Medical History:  Diagnosis Date   CHF (congestive heart failure) (HCC)    Dyspnea    Hypertension    Hypothyroidism    Joint pain    Multiple food  allergies    NICM (nonischemic cardiomyopathy) ([place])    a. Echo 2/17:  moderate LVH, EF 30-35%, diffuse HK, moderate LAE   NICM (nonischemic cardiomyopathy) ([place]) 09/26/2020   Non-ST elevation (NSTEMI) myocardial infarction ([place]) 09/26/2020    Patient Active Problem List   Diagnosis Date Noted   Non-ST elevation (NSTEMI) myocardial infarction ([place]) 09/26/2020   Anticoagulation adequate 09/26/2020   NICM (nonischemic cardiomyopathy) ([place]) 09/26/2020   Atrial flutter ([place])    Insulin resistance 07/22/2019   Other hyperlipidemia 07/22/2019   Vitamin D deficiency 07/22/2019   Goiter 61/22/4497   Chronic systolic CHF (congestive heart failure) ([place]) 11/22/2015   Elevated troponin 05/29/2015   CHF exacerbation ([place]) 05/29/2015   Accelerated hypertension    Noncompliance with medication regimen    Papilledema 07/27/2014   Acute on chronic systolic and diastolic heart failure, NYHA class 1 ([place]) 02/09/2014   Essential hypertension 02/09/2014   Class 3 severe obesity with serious comorbidity and body mass index (BMI) of 40.0 to 44.9 in adult ([place]) 02/09/2014   Subclinical hypothyroidism 02/09/2014    Past Surgical History:  Procedure Laterality Date   A-FLUTTER ABLATION N/A 01/22/2021   Procedure: A-FLUTTER ABLATION;  Surgeon: Constance Haw, MD;  Location: [facility];  Service: Cardiovascular;  Laterality: N/A;   BUBBLE STUDY  09/26/2020   Procedure: BUBBLE STUDY;  Surgeon: Johney Frame,  Greer Ee, MD;  Location: [facility];  Service: Cardiovascular;;   CARDIOVERSION N/A 09/26/2020   Procedure: CARDIOVERSION;  Surgeon: Freada Bergeron, MD;  Location: [facility];  Service: Cardiovascular;  Laterality: N/A;   LEFT HEART CATH AND CORONARY ANGIOGRAPHY N/A 09/24/2020   Procedure: LEFT HEART CATH AND CORONARY ANGIOGRAPHY;  Surgeon: Lorretta Harp, MD;  Location: [facility];  Service: Cardiovascular;  Laterality: N/A;   MYOMECTOMY     None     TEE WITHOUT  CARDIOVERSION N/A 09/26/2020   Procedure: TRANSESOPHAGEAL ECHOCARDIOGRAM (TEE);  Surgeon: Freada Bergeron, MD;  Location: [facility];  Service: Cardiovascular;  Laterality: N/A;     OB History     Gravida  2   Para      Term      Preterm      AB      Living  2      SAB      IAB      Ectopic      Multiple      Live Births              Family History  Problem Relation Age of Onset   Diabetes Mother    Hypertension Mother    Depression Mother    Obesity Mother    Muscular dystrophy Brother    Kidney failure Brother    Muscular dystrophy Son    Migraines Neg Hx    Multiple sclerosis Neg Hx    Thyroid disease Neg Hx     Social History   Tobacco Use   Smoking status: Never   Smokeless tobacco: Never  Vaping Use   Vaping Use: Never used  Substance Use Topics   Alcohol use: Yes    Alcohol/week: 0.0 standard drinks    Comment: Occasional   Drug use: No    Home Medications Prior to Admission medications   Medication Sig Start Date End Date Taking? Authorizing Provider  aspirin 81 MG chewable tablet Chew 81 mg by mouth daily. Pt reports she took 6 pills prior to coming to the hospital   Yes [provider]  carvedilol (COREG) 3.125 MG tablet Take 1 tablet (3.125 mg total) by mouth 2 (two) times daily with a meal. 07/11/21  Yes Keagan Brislin, Corene Cornea, MD  empagliflozin (JARDIANCE) 10 MG TABS tablet Take 1 tablet (10 mg total) by mouth daily. 12/11/20  Yes Camnitz, Will Hassell Done, MD  furosemide (LASIX) 40 MG tablet Take 1 tablet (40 mg total) by mouth daily. 03/05/21  Yes Larey Dresser, MD  isosorbide mononitrate (IMDUR) 30 MG 24 hr tablet Take 0.5 tablets (15 mg total) by mouth daily. Patient taking differently: Take 30 mg by mouth daily. 04/29/21  Yes Larey Dresser, MD  Multiple Vitamin (MULTIVITAMIN WITH MINERALS) TABS tablet Take 1 tablet by mouth daily.   Yes [provider]  polyvinyl alcohol (LIQUIFILM TEARS) 1.4 % ophthalmic  solution Place 1 drop into both eyes 3 (three) times daily.   Yes [provider]  sacubitril-valsartan (ENTRESTO) 49-51 MG Take 1 tablet by mouth 2 (two) times daily. 07/05/21  Yes Milford, Maricela Bo, FNP  spironolactone (ALDACTONE) 25 MG tablet Take 1 tablet (25 mg total) by mouth daily. 12/31/20  Yes Larey Dresser, MD    Allergies    Shellfish allergy, Gadolinium derivatives, Iodine, and Latex  Review of Systems   Review of Systems  All other systems reviewed and are negative.  Physical Exam Updated Vital Signs  BP (!) 124/92    Pulse 89    Temp 97.9 F (36.6 C) (Oral)    Resp 17    Ht $R'5\' 8"'Da$  (1.727 m)    Wt 122.5 kg    SpO2 100%    BMI 41.05 kg/m   Physical Exam Vitals and nursing note reviewed.  Constitutional:      Appearance: She is well-developed.  HENT:     Head: Normocephalic and atraumatic.     Nose: No congestion or rhinorrhea.     Mouth/Throat:     Mouth: Mucous membranes are moist.     Pharynx: Oropharynx is clear.  Eyes:     Pupils: Pupils are equal, round, and reactive to light.  Cardiovascular:     Rate and Rhythm: Regular rhythm. Tachycardia present.  Pulmonary:     Effort: Pulmonary effort is normal. No respiratory distress.     Breath sounds: No stridor.  Abdominal:     General: Abdomen is flat. There is no distension.  Musculoskeletal:        General: No swelling or tenderness. Normal range of motion.     Cervical back: Normal range of motion.  Skin:    General: Skin is warm and dry.  Neurological:     General: No focal deficit present.     Mental Status: She is alert.    ED Results / Procedures / Treatments   Labs (all labs ordered are listed, but only abnormal results are displayed) Labs Reviewed  COMPREHENSIVE METABOLIC PANEL - Abnormal; Notable for the following components:      Result Value   Glucose, Bld 132 (*)    Creatinine, Ser 1.14 (*)    Calcium 8.8 (*)    Total Protein 6.4 (*)    Albumin 3.1 (*)    GFR, Estimated 59  (*)    All other components within normal limits  TSH - Abnormal; Notable for the following components:   TSH 6.624 (*)    All other components within normal limits  CBC WITH DIFFERENTIAL/PLATELET  MAGNESIUM  T4, FREE    EKG EKG Interpretation  Date/Time:  Thursday July 11 2021 01:49:32 EST Ventricular Rate:  103 PR Interval:  156 QRS Duration: 92 QT Interval:  357 QTC Calculation: 468 R Axis:   8 Text Interpretation: Sinus tachycardia Low voltage, precordial leads Anteroseptal infarct, old Confirmed by Merrily Pew [phone]) on 07/11/2021 2:46:07 AM  Radiology No results found.  Procedures Procedures   Medications Ordered in ED Medications  carvedilol (COREG) tablet 3.125 mg (has no administration in time range)  lactated ringers bolus 1,000 mL ( Intravenous Infusion Verify 07/11/21 0403)    ED Course  I have reviewed the triage vital signs and the nursing notes.  Pertinent labs & imaging results that were available during my care of the patient were reviewed by me and considered in my medical decision making (see chart for details).    MDM Rules/Calculators/A&P                         Patient is in sinus tachycardia right now.  We will check her labs to include a TSH as it has not been checked recently.  After these I will discuss with cardiology about making some medication changes.  Patient consistently and sinus rhythm here.  TSH slightly elevated however not the point where I would think that it is related to her heart issue.  We will follow-up with her primary doctor.  I discussed with Dr. Chancy Milroy who is on-call for cardiology who recommend to switch her metoprolol to carvedilol secondary to her symptoms related to the metoprolol.  Her primary cardiologist are made aware.  Patient appears to be stable for discharge at this time   Final Clinical Impression(s) / ED Diagnoses Final diagnoses:  SVT (supraventricular tachycardia) ([place])  Hypothyroidism, unspecified  type    Rx / DC Orders ED Discharge Orders          Ordered    carvedilol (COREG) 3.125 MG tablet  2 times daily with meals        07/11/21 0411             Ceil Roderick, Corene Cornea, MD 07/11/21 [phone]

## 2021-07-11 NOTE — ED Triage Notes (Signed)
Pt bib [facility] from home with SVT. Rate in 260's. Pt got 6, 6, 12 adenosine with no change. Cardioverted at 100j. Rate currently 103. 2.5 versed, 50 mcg fent, and 500 NS given PTA. Hx of the same - pt states last time it happened was christmas.   BP 129/94, HR 103, Spo2 98%

## 2021-07-11 NOTE — Telephone Encounter (Addendum)
Pt aware, agreeable, and verbalized understanding Coming in to have a zio patch applied this afternoon.   ----- Message from Larey Dresser, MD sent at 07/11/2021  2:51 PM EST ----- Regarding: FW: Medication change Would have her at least increase the Coreg to 6.25 mg bid.  Make sure she has Zio patch on, if not, needs to come for it ASAP.  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    ----- Message ----- From: Merrily Pew, MD Sent: 07/11/2021   4:16 AM EST To: Larey Dresser, MD, Will Meredith Leeds, MD Subject: Medication change                              Drs, I saw Ms. Murton in the ED tonight with a recurrent episode of SVT. She had HR of 260+ with EMS and ultimately got cardioverted prior to arriving here.  She stated that she has some lightheadedness and near syncopal episodes with her metoprolol and feels like it is too high of a dose.  She shows me her blood pressure logs where she pressures of 459 systolic and then about 30 to 45 minutes aftertaking the metoprolol she will have blood pressures in the low 90s.  She asked if we could change something there.  She also has not had a chance to do her Zio patch yet.  She has appointments with you all in about 4 weeks.  I discussed with Dr. Chancy Milroy, the fellow on-call, who recommended switching her metoprolol over to carvedilol at 3.125 twice a day to see if it helps with her SVT and also to reduce her symptoms with the metoprolol.  I just wanted to make you aware of this change so you could adjust as necessary. Thank you for your time.  Corene Cornea Mesner

## 2021-08-02 ENCOUNTER — Encounter (HOSPITAL_COMMUNITY): Payer: Self-pay | Admitting: Cardiology

## 2021-08-02 ENCOUNTER — Other Ambulatory Visit: Payer: Self-pay

## 2021-08-02 ENCOUNTER — Ambulatory Visit (HOSPITAL_COMMUNITY)
Admission: RE | Admit: 2021-08-02 | Discharge: 2021-08-02 | Disposition: A | Discharge status: Home / Self Care | Source: Ambulatory Visit | Attending: Cardiology | Admitting: Cardiology

## 2021-08-02 VITALS — BP 102/60 | HR 81 | Wt 285.4 lb

## 2021-08-02 DIAGNOSIS — M6281 Muscle weakness (generalized): Secondary | ICD-10-CM | POA: Insufficient documentation

## 2021-08-02 DIAGNOSIS — I493 Ventricular premature depolarization: Secondary | ICD-10-CM | POA: Diagnosis not present

## 2021-08-02 DIAGNOSIS — Z8249 Family history of ischemic heart disease and other diseases of the circulatory system: Secondary | ICD-10-CM | POA: Insufficient documentation

## 2021-08-02 DIAGNOSIS — E039 Hypothyroidism, unspecified: Secondary | ICD-10-CM | POA: Insufficient documentation

## 2021-08-02 DIAGNOSIS — I5022 Chronic systolic (congestive) heart failure: Secondary | ICD-10-CM | POA: Diagnosis not present

## 2021-08-02 DIAGNOSIS — Z79899 Other long term (current) drug therapy: Secondary | ICD-10-CM | POA: Diagnosis not present

## 2021-08-02 DIAGNOSIS — E669 Obesity, unspecified: Secondary | ICD-10-CM | POA: Insufficient documentation

## 2021-08-02 DIAGNOSIS — I110 Hypertensive heart disease with heart failure: Secondary | ICD-10-CM | POA: Insufficient documentation

## 2021-08-02 DIAGNOSIS — I483 Typical atrial flutter: Secondary | ICD-10-CM | POA: Diagnosis not present

## 2021-08-02 DIAGNOSIS — Z7984 Long term (current) use of oral hypoglycemic drugs: Secondary | ICD-10-CM | POA: Diagnosis not present

## 2021-08-02 DIAGNOSIS — I471 Supraventricular tachycardia: Secondary | ICD-10-CM | POA: Diagnosis not present

## 2021-08-02 DIAGNOSIS — Z6841 Body Mass Index (BMI) 40.0 and over, adult: Secondary | ICD-10-CM | POA: Insufficient documentation

## 2021-08-02 LAB — BASIC METABOLIC PANEL
Anion gap: 7 (ref 5–15)
BUN: 11 mg/dL (ref 6–20)
CO2: 26 mmol/L (ref 22–32)
Calcium: 9.8 mg/dL (ref 8.9–10.3)
Chloride: 103 mmol/L (ref 98–111)
Creatinine, Ser: 0.89 mg/dL (ref 0.44–1.00)
GFR, Estimated: >60 mL/min (ref >60)
Glucose, Bld: 83 mg/dL (ref 70–99)
Potassium: 4.3 mmol/L (ref 3.5–5.1)
Sodium: 136 mmol/L (ref 135–145)

## 2021-08-02 LAB — TSH: TSH: 2.028 u[IU]/mL (ref 0.350–4.500)

## 2021-08-02 LAB — T4, FREE: Free T4: 0.77 ng/dL (ref 0.61–1.12)

## 2021-08-02 LAB — BRAIN NATRIURETIC PEPTIDE: B Natriuretic Peptide: 356.7 pg/mL — ABNORMAL HIGH (ref 0.0–100.0)

## 2021-08-02 MED ORDER — CARVEDILOL 3.125 MG PO TABS: 6.2500 mg | ORAL_TABLET | Freq: Two times a day (BID) | ORAL | 3 refills | Status: DC

## 2021-08-02 NOTE — Patient Instructions (Signed)
Medication Changes:  Increase carvedilol 6.25mg  Twice daily    Lab Work:  Labs done today, your results will be available in MyChart, we will contact you for abnormal readings.   Testing/Procedures:  none  Referrals:  none  Special Instructions // Education:  none  Follow-Up in: 6 weeks  At the [facility], you and your health needs are our priority. We have a designated team specialized in the treatment of Heart Failure. This Care Team includes your primary Heart Failure Specialized Cardiologist (physician), Advanced Practice Providers (APPs- Physician Assistants and Nurse Practitioners), and Pharmacist who all work together to provide you with the care you need, when you need it.   You may see any of the following providers on your designated Care Team at your next follow up:  Dr Glori Bickers Dr Haynes Kerns, NP Lyda Jester, [place] [facility], [place] Audry Riles, PharmD   Please be sure to bring in all your medications bottles to every appointment.   Need to Contact us:  If you have any questions or concerns before your next appointment please send [place] a message through [place] or call our office at [phone].    TO LEAVE A MESSAGE FOR THE NURSE SELECT OPTION 2, PLEASE LEAVE A MESSAGE INCLUDING: YOUR NAME DATE OF BIRTH CALL BACK NUMBER REASON FOR CALL**this is important as we prioritize the call backs  YOU WILL RECEIVE A CALL BACK THE SAME DAY AS LONG AS YOU CALL BEFORE 4:00 PM

## 2021-08-03 LAB — T3, FREE: T3, Free: 2.6 pg/mL (ref 2.0–4.4)

## 2021-08-04 NOTE — Progress Notes (Signed)
Patient ID: Suzanne Long, female   DOB: 06-28-1971, 51 y.o.   MRN: [id number] PCP: Lennie Odor HF Cardiology: Dr. Aundra Dubin  51 y.o. with history of HTN and nonischemic cardiomyopathy presents for followup of CHF.      She was diagnosed with CHF initially in 2011.  At that time, she was hospitalized with volume overload.  Coronary angiography in 2011 showed no significant CAD, but EF was about 25%.  BP was very high.  She was started on cardiac meds and BP improved.  Per her report, EF improved.  She eventually stopped all her medications.  She was then hospitalized in 11/16 with CHF exacerbation. EF was 30-35% on 11/16 echo and 30-35% on 2/17 echo.   Echo in 7/17 showed EF up to 40%.   She stopped her medications again.  She was then admitted in 3/22 with atrial flutter/RVR and CHF.  Unsure how long atrial flutter was present as she did not feel palpitations.  She had TEE-guided DCCV.  TEE showed EF 20-25% with severely decreased RV function, mild MR.  LHC in 3/22 showed no significant CAD.  Cardiac MRI in 6/22 showed LV EF 31%, RVEF 52%, basal-mid lateral subendocardial LGE (>50% wall thickness), septal mid-wall LGE.  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 She was seen by Dr. Broadus John, and this gene mutation was actually not thought to be the cause of DMD in her family (she likely has a mutation that was not picked up by the [employer] testing).   In 7/22, she had atrial flutter ablation.   Echo in 10/22 showed EF 20-25% with moderate LV dilation, normal RV, normal IVC.   Patient went to the ER in 06/19/21 with SVT, ?atypical flutter with 2:1 block.  She was in the ER 07/11/21 with SVT rate >200, she was cardioverted by EMS and she was in NSR in the ER.  Zio monitor in 1/23 showed 8 runs NSVT, longest 19 beats and 23 runs SVT (?atrial tachycardia), longest 16 beats.  No atrial fibrillation.   Patient returns for followup of CHF.  She is in NSR today.  She has not had  recent palpitations.  She feels like Coreg helps eliminate palpitations better than Toprol XL did. No dyspnea walking on flat ground.  She does ok with a flight of stairs.  No chest pain.  Still gets muscle aching with activity, this is chronic.  She is only taking Coreg once a day.  BP has run low recently and meds have been cut back.  No lightheaded spells since cutting back meds.   Labs (3/17): K 3.7, creatinine 0.84, SPEP negative, TSH normal.  Labs (5/17): K 3.9, creatinine 0.81, BNP normal Labs (3/22): K 5.2, creatinine 1.0 Labs (7/22): CK 1214 Labs (9/22): K 4.3, creatinine 1.04 Labs (12/22): TSH elevated at 6.6, free T4 normal, K 3.6, creatinine 1.14, BNP 161  ECG (personally reviewed): NSR, nonspecific T wave flattening  PMH: 1. Chronic systolic CHF: 1st noted in 2011.  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  - LHC (2011) with no significant CAD, EF 25%.   - Echo (11/16) with EF 30-35%, diffuse hypokinesis. - Echo (2/17) with EF 30-35%, moderate LVH, diffuse hypokinesis.  - Echo (7/17) with EF 40%, severe LV dilation, grade II diastolic dysfunction.  - TEE (3/22) with EF 20-25% with severely decreased RV function, mild MR -  LHC (3/22): No significant CAD.  - Cardiac MRI (6/22): LV EF 31%, RVEF 52%, basal-mid lateral subendocardial LGE (>50% wall thickness), septal mid-wall LGE.   - Echo (10/22): EF 20-25% with moderate LV dilation, normal RV, normal IVC.  2. HTN 3. Hypothyroidism 4. Obesity.  5. Goiter 6. Atrial flutter: 3/22, s/p DCCV.  - Ablation in 7/22.  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  8. PVCs: Zio monitor (8/22) with 8% PVCs, no AF/AFL 9. SVT: noted in ER x 2 in 12/22, once possible atypical flutter and once cardioverted before reached ER.  - Zio (1/23): 8 runs NSVT, longest 19 beats and 23 runs SVT  (?atrial tachycardia), longest 16 beats.  No atrial fibrillation.   SH:  Nonsmoker.  Rare ETOH.  No drugs.    FH: Mother, grandmother with HTN.  Uncle with MI. 2 sons with Duchenne muscular dystrophy (both have passed away).  Brother with Duchenne muscular dystrophy.  ROS: All systems reviewed and negative except as per HPI.   Current Outpatient Medications  Medication Sig Dispense Refill   empagliflozin (JARDIANCE) 10 MG TABS tablet Take 1 tablet (10 mg total) by mouth daily. 90 tablet 3   furosemide (LASIX) 40 MG tablet Take 1 tablet (40 mg total) by mouth daily. 90 tablet 3   isosorbide mononitrate (IMDUR) 30 MG 24 hr tablet Take 0.5 tablets (15 mg total) by mouth daily. 45 tablet 3   Multiple Vitamin (MULTIVITAMIN WITH MINERALS) TABS tablet Take 1 tablet by mouth daily.     polyvinyl alcohol (LIQUIFILM TEARS) 1.4 % ophthalmic solution Place 1 drop into both eyes 3 (three) times daily.     sacubitril-valsartan (ENTRESTO) 49-51 MG Take 1 tablet by mouth 2 (two) times daily. 60 tablet 3   spironolactone (ALDACTONE) 25 MG tablet Take 1 tablet (25 mg total) by mouth daily. 90 tablet 3   carvedilol (COREG) 3.125 MG tablet Take 2 tablets (6.25 mg total) by mouth 2 (two) times daily. 60 tablet 3   No current facility-administered medications for this encounter.   BP 102/60    Pulse 81    Wt 129.5 kg (285 lb 6.4 oz)    SpO2 100%    BMI 43.39 kg/m  General: NAD Neck: No JVD, no thyromegaly or thyroid nodule.  Lungs: Clear to auscultation bilaterally with normal respiratory effort. CV: Nondisplaced PMI.  Heart regular S1/S2, no S3/S4, no murmur.  No peripheral edema.  No carotid bruit.  Normal pedal pulses.  Abdomen: Soft, nontender, no hepatosplenomegaly, no distention.  Skin: Intact without lesions or rashes.  Neurologic: Alert and oriented x 3.  Psych: Normal affect. Extremities: No clubbing or cyanosis.  HEENT: Normal.   Assessment/Plan: 1. Chronic systolic CHF: Nonischemic  cardiomyopathy. No CAD on coronary angiography in 2011 and 3/22.  She had 2 sons and a brother with Duchenne's muscular dystrophy.  She notes chronic muscle weakness with squat->stand.  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  TEE in 3/22 showed EF 20-25% with severe RV dysfunction.  Cardiac MRI in 6/22 showed LV EF 31%, RVEF 52%, basal-mid lateral subendocardial LGE (>50% wall thickness), septal mid-wall LGE.  LGE, especially in the inferolateral wall, is seen with DMD-related cardiomyopathy. Echo in 10/22 showed EF 20-25%, moderate LV dilation, normal RV.  She is not volume overloaded on exam.  NYHA class II symptoms. - She is taking Lasix 40 mg daily.  BMET/BNP today.  - She needs  to increase Coreg to 6.25 mg bid rather than once daily.     - Continue Entresto 49/51 bid, decreased from maximum dose due to orthostasis.   - Continue spironolactone 25 mg daily. - Continue Jardiance 10 mg daily.   - She is only taking hydralazine once daily due to low BP.  I will have her stop this.  I think she can also stop the low dose Imdur.   - She will be seeing Dr. Curt Bears in 2/22, she will need ICD placement with long-standing low EF. Not CRT candidate with narrow QRS.   2. HTN: BP now on the low side.    3. H/o hypothyroidism: No thyroid replacement. Following with endocrinology for goiter. - Check TSH, free T3, free T4.  4. Obesity: We discussed diet and exercise for weight loss again.   - I will refer her to [facility] for semaglutide for weight loss.  5. Atrial flutter: Typical flutter in 3/22 with CHF decompensation.  Now s/p DCCV back to NSR and atrial flutter ablation, she is in NSR today.  Recent runs of SVT, 1 may have been atypical flutter.  - She is now off apixaban. - Has upcoming evaluation by Dr. Curt Bears.    6. DMD heterozygote: I suspect this is the cause of her mild chronic muscle weakness. CPK has been mildly elevated.  7.  PVCs: Frequent on 8/22 Zio monitor, 8% total. 5.7% on 1/23 Zio monitor.  - Not high enough percentage to warrant amiodarone.  - Increasing Coreg as above.  8. SVT: Noted on Zio in 1/23, ?atrial tachy.  - Continue Coreg.  - To see Dr. Curt Bears with EP soon.   Followup 6 wks with APP.   Loralie Champagne 08/04/2021

## 2021-08-06 ENCOUNTER — Telehealth: Payer: Self-pay | Admitting: Student-PharmD

## 2021-08-06 NOTE — Telephone Encounter (Signed)
Received referral from Dr. Aundra Dubin to start semaglutide for this patient for weight loss. They meet FDA approved criteria for semaglutide for use in obesity given BMI 43.39. Obesity is complicated by chronic conditions including HTN, CHF. No contraindications seen in the chart to [facility] use.   Submitted PA for [place] to her insurance 08/06/21.

## 2021-08-06 NOTE — Telephone Encounter (Signed)
-----   Message from Leeroy Bock, [place] sent at 08/06/2021  7:28 AM EST -----  ----- Message ----- From: Jerl Mina, RN Sent: 08/02/2021   3:28 PM EST To: Leeroy Bock, RPH-CPP  Dr. Aundra Dubin wants to start her on Semeglatide

## 2021-08-08 ENCOUNTER — Encounter: Payer: Self-pay | Admitting: Cardiology

## 2021-08-08 ENCOUNTER — Ambulatory Visit (INDEPENDENT_AMBULATORY_CARE_PROVIDER_SITE_OTHER): Admitting: Cardiology

## 2021-08-08 ENCOUNTER — Other Ambulatory Visit: Payer: Self-pay

## 2021-08-08 VITALS — BP 96/70 | HR 65 | Ht 68.5 in | Wt 286.8 lb

## 2021-08-08 DIAGNOSIS — I493 Ventricular premature depolarization: Secondary | ICD-10-CM | POA: Diagnosis not present

## 2021-08-08 DIAGNOSIS — I5022 Chronic systolic (congestive) heart failure: Secondary | ICD-10-CM

## 2021-08-08 NOTE — Progress Notes (Signed)
Electrophysiology Office Note   Date:  08/08/2021   ID:  Suzanne Long, DOB [birth year], MRN [id number]  PCP:  Pcp, No  Cardiologist:  Shirlee Latch Primary Electrophysiologist:  Adia Crammer [name], MD    Chief Complaint: Atrial flutter   History of Present Illness: Suzanne Long is a 51 y.o. female who is being seen today for the evaluation of atrial flutter at the request of Laurey Morale, MD. Presenting today for electrophysiology evaluation.  She has a history significant for chronic systolic heart failure due to nonischemic cardiomyopathy, hypertension, obesity, atrial flutter, PVCs.  She is now status post atrial flutter ablation on 01/22/2021.  She wore a cardiac monitor that showed 8% and 5% PVCs.  Unfortunately her ejection fraction did not improve after ablation.  Today, denies symptoms of palpitations, chest pain, shortness of breath, orthopnea, PND, lower extremity edema, claudication, dizziness, presyncope, syncope, bleeding, or neurologic sequela. The patient is tolerating medications without difficulties.  She feels well today.  In December, she had 2 episodes of tachypalpitations that made her "feel quite poorly.  Her ECG shows what is likely an atypical atrial flutter.  She states that she got adenosine which converted her to sinus rhythm.  She did require cardioversion on a separate episode.  Her metoprolol was stopped and she was started on carvedilol which greatly improved her symptoms.   Past Medical History:  Diagnosis Date   CHF (congestive heart failure) (HCC)    Dyspnea    Hypertension    Hypothyroidism    Joint pain    Multiple food allergies    NICM (nonischemic cardiomyopathy) (HCC)    a. Echo 2/17:  moderate LVH, EF 30-35%, diffuse HK, moderate LAE   NICM (nonischemic cardiomyopathy) (HCC) 09/26/2020   Non-ST elevation (NSTEMI) myocardial infarction (HCC) 09/26/2020   Past Surgical History:  Procedure Laterality Date   A-FLUTTER ABLATION N/A 01/22/2021    Procedure: A-FLUTTER ABLATION;  Surgeon: Regan Lemming, MD;  Location: [facility] INVASIVE CV LAB;  Service: Cardiovascular;  Laterality: N/A;   BUBBLE STUDY  09/26/2020   Procedure: BUBBLE STUDY;  Surgeon: Meriam Sprague, MD;  Location: [facility] ENDOSCOPY;  Service: Cardiovascular;;   CARDIOVERSION N/A 09/26/2020   Procedure: CARDIOVERSION;  Surgeon: Meriam Sprague, MD;  Location: [facility] ENDOSCOPY;  Service: Cardiovascular;  Laterality: N/A;   LEFT HEART CATH AND CORONARY ANGIOGRAPHY N/A 09/24/2020   Procedure: LEFT HEART CATH AND CORONARY ANGIOGRAPHY;  Surgeon: Runell Gess, MD;  Location: [facility] INVASIVE CV LAB;  Service: Cardiovascular;  Laterality: N/A;   MYOMECTOMY     None     TEE WITHOUT CARDIOVERSION N/A 09/26/2020   Procedure: TRANSESOPHAGEAL ECHOCARDIOGRAM (TEE);  Surgeon: Meriam Sprague, MD;  Location: [facility] ENDOSCOPY;  Service: Cardiovascular;  Laterality: N/A;     Current Outpatient Medications  Medication Sig Dispense Refill   carvedilol (COREG) 3.125 MG tablet Take 2 tablets (6.25 mg total) by mouth 2 (two) times daily. 60 tablet 3   empagliflozin (JARDIANCE) 10 MG TABS tablet Take 1 tablet (10 mg total) by mouth daily. 90 tablet 3   furosemide (LASIX) 40 MG tablet Take 1 tablet (40 mg total) by mouth daily. 90 tablet 3   Multiple Vitamin (MULTIVITAMIN WITH MINERALS) TABS tablet Take 1 tablet by mouth daily.     polyvinyl alcohol (LIQUIFILM TEARS) 1.4 % ophthalmic solution Place 1 drop into both eyes 3 (three) times daily.     sacubitril-valsartan (ENTRESTO) 49-51 MG Take 1 tablet by mouth 2 (two) times daily. 60  tablet 3   spironolactone (ALDACTONE) 25 MG tablet Take 1 tablet (25 mg total) by mouth daily. 90 tablet 3   isosorbide mononitrate (IMDUR) 30 MG 24 hr tablet Take 0.5 tablets (15 mg total) by mouth daily. (Patient not taking: Reported on 08/08/2021) 45 tablet 3   No current facility-administered medications for this visit.    Allergies:   Shellfish allergy,  Gadolinium derivatives, Iodine, and Latex   Social History:  The patient  reports that she has never smoked. She has never used smokeless tobacco. She reports current alcohol use. She reports that she does not use drugs.   Family History:  The patient's family history includes Depression in her mother; Diabetes in her mother; Hypertension in her mother; Kidney failure in her brother; Muscular dystrophy in her brother and son; Obesity in her mother.   ROS:  Please see the history of present illness.   Otherwise, review of systems is positive for none.   All other systems are reviewed and negative.   PHYSICAL EXAM: VS:  BP 96/70    Pulse 65    Ht 5' 8.5" (1.74 m)    Wt 286 lb 12.8 oz (130.1 kg)    SpO2 97%    BMI 42.97 kg/m  , BMI Body mass index is 42.97 kg/m. GEN: Well nourished, well developed, in no acute distress  HEENT: normal  Neck: no JVD, carotid bruits, or masses Cardiac: RRR; no murmurs, rubs, or gallops,no edema  Respiratory:  clear to auscultation bilaterally, normal work of breathing GI: soft, nontender, nondistended, + BS MS: no deformity or atrophy  Skin: warm and dry Neuro:  Strength and sensation are intact Psych: euthymic mood, full affect  EKG:  EKG is ordered today. Personal review of the ekg ordered shows sinus rhythm, rate 65  Recent Labs: 07/11/2021: ALT 21; Hemoglobin 13.0; Magnesium 1.7; Platelets 263 08/02/2021: B Natriuretic Peptide 356.7; BUN 11; Creatinine, Ser 0.89; Potassium 4.3; Sodium 136; TSH 2.028    Lipid Panel     Component Value Date/Time   CHOL 170 09/23/2020 0433   CHOL 175 05/31/2019 1140   TRIG 80 09/23/2020 0433   HDL 45 09/23/2020 0433   HDL 51 05/31/2019 1140   CHOLHDL 3.8 09/23/2020 0433   VLDL 16 09/23/2020 0433   LDLCALC 109 (H) 09/23/2020 0433   LDLCALC 112 (H) 05/31/2019 1140     Wt Readings from Last 3 Encounters:  08/08/21 286 lb 12.8 oz (130.1 kg)  08/02/21 285 lb 6.4 oz (129.5 kg)  07/11/21 270 lb (122.5 kg)       Other studies Reviewed: Additional studies/ records that were reviewed today include: Left heart catheterization 09/24/2020 Review of the above records today demonstrates:  There is severe left ventricular systolic dysfunction. LV end diastolic pressure is mildly elevated. The left ventricular ejection fraction is less than 25% by visual estimate.  Cardiac monitor 08/02/2021 personally reviewed 1. Predominantly NSR 2. 8 runs NSVT, longest 19 beats. 3. 23 runs SVT, longest 16 beats (?atrial tachycardia) 4. 5.7% PVCs  TTE 04/29/2021  1. Left ventricular ejection fraction, by estimation, is 20 to 25%. The  left ventricle has severely decreased function. The left ventricle  demonstrates global hypokinesis. The left ventricular internal cavity size  was moderately dilated. Left  ventricular diastolic parameters are consistent with Grade I diastolic  dysfunction (impaired relaxation). The average left ventricular global  longitudinal strain is -13.7 %. The global longitudinal strain is  abnormal.   2. Right ventricular systolic function  is normal. The right ventricular  size is normal. Tricuspid regurgitation signal is inadequate for assessing  PA pressure.   3. The mitral valve is normal in structure. Trivial mitral valve  regurgitation. No evidence of mitral stenosis.   4. The aortic valve is tricuspid. Aortic valve regurgitation is not  visualized. No aortic stenosis is present.   5. The inferior vena cava is normal in size with greater than 50%  respiratory variability, suggesting right atrial pressure of 3 mmHg.   ASSESSMENT AND PLAN:  1.  Typical atrial flutter: Status post ablation 01/22/2021.  She is remained in sinus rhythm.  CHA2DS2-VASc of 2 and thus not anticoagulated as she has had ablation.  No changes.  2.  Chronic systolic heart failure due to nonischemic cardiomyopathy: Currently on Lasix, Toprol-XL, Entresto, Aldactone, Jardiance.  Repeat echo shows a persistently  low ejection fraction.  She would benefit from ICD implant.  She does have quite a bit of LV scar.  We discussed ICD options, but she is hesitant as she develops keloids.  She Suzanne Long need an ICD, but would like to think about this further.  3.  PVCs: 5.7% burden on most recent monitor.  Unlikely to cause her reduced ejection fraction.  4.  Atypical atrial flutter: Has required cardioversions.  She is quite symptomatic from this.  Due to her atrial flutter, we Anandi Abramo plan to start her on Eliquis.  CHA2DS2-VASc of 3.  If she continues to have episodes, may require antiarrhythmic medications.  Case discussed with primary cardiology  Current medicines are reviewed at length with the patient today.   The patient does not have concerns regarding her medicines.  The following changes were made today: Start Eliquis  Labs/ tests ordered today include:  Orders Placed This Encounter  Procedures   EKG 12-Lead      Disposition:   FU with Frederick Marro 6 months  Signed, Nathalya Wolanski Meredith Leeds, MD  08/08/2021 10:28 AM     [address] [place] [address] [phone] (office) [phone] (fax)

## 2021-08-13 NOTE — Telephone Encounter (Signed)
[facility] denied due to plan requiring all alternative weight loss medications to be tried first regardless of patient specific contraindications to the alternatives. Ozempic is also not covered. Unable to start GLP1 for weight loss at this time. Spoke with patient on the phone who is aware of the denial.

## 2021-09-13 ENCOUNTER — Ambulatory Visit (HOSPITAL_COMMUNITY)
Admission: RE | Admit: 2021-09-13 | Discharge: 2021-09-13 | Disposition: A | Discharge status: Home / Self Care | Source: Ambulatory Visit | Attending: Family Medicine | Admitting: Family Medicine

## 2021-09-13 ENCOUNTER — Other Ambulatory Visit: Payer: Self-pay

## 2021-09-13 ENCOUNTER — Encounter (HOSPITAL_COMMUNITY): Payer: Self-pay

## 2021-09-13 VITALS — BP 80/54 | HR 90 | Wt 285.6 lb

## 2021-09-13 DIAGNOSIS — I428 Other cardiomyopathies: Secondary | ICD-10-CM | POA: Diagnosis not present

## 2021-09-13 DIAGNOSIS — Z7901 Long term (current) use of anticoagulants: Secondary | ICD-10-CM | POA: Insufficient documentation

## 2021-09-13 DIAGNOSIS — E039 Hypothyroidism, unspecified: Secondary | ICD-10-CM | POA: Diagnosis not present

## 2021-09-13 DIAGNOSIS — L910 Hypertrophic scar: Secondary | ICD-10-CM | POA: Diagnosis not present

## 2021-09-13 DIAGNOSIS — R5383 Other fatigue: Secondary | ICD-10-CM | POA: Diagnosis present

## 2021-09-13 DIAGNOSIS — I493 Ventricular premature depolarization: Secondary | ICD-10-CM | POA: Insufficient documentation

## 2021-09-13 DIAGNOSIS — I110 Hypertensive heart disease with heart failure: Secondary | ICD-10-CM | POA: Diagnosis not present

## 2021-09-13 DIAGNOSIS — M6281 Muscle weakness (generalized): Secondary | ICD-10-CM | POA: Diagnosis not present

## 2021-09-13 DIAGNOSIS — I5022 Chronic systolic (congestive) heart failure: Secondary | ICD-10-CM | POA: Insufficient documentation

## 2021-09-13 DIAGNOSIS — Z7984 Long term (current) use of oral hypoglycemic drugs: Secondary | ICD-10-CM | POA: Diagnosis not present

## 2021-09-13 DIAGNOSIS — I471 Supraventricular tachycardia: Secondary | ICD-10-CM

## 2021-09-13 DIAGNOSIS — I483 Typical atrial flutter: Secondary | ICD-10-CM | POA: Insufficient documentation

## 2021-09-13 DIAGNOSIS — E669 Obesity, unspecified: Secondary | ICD-10-CM | POA: Diagnosis not present

## 2021-09-13 DIAGNOSIS — I10 Essential (primary) hypertension: Secondary | ICD-10-CM

## 2021-09-13 DIAGNOSIS — Z6841 Body Mass Index (BMI) 40.0 and over, adult: Secondary | ICD-10-CM | POA: Diagnosis not present

## 2021-09-13 DIAGNOSIS — E038 Other specified hypothyroidism: Secondary | ICD-10-CM | POA: Diagnosis not present

## 2021-09-13 DIAGNOSIS — Z79899 Other long term (current) drug therapy: Secondary | ICD-10-CM | POA: Diagnosis not present

## 2021-09-13 LAB — BASIC METABOLIC PANEL
Anion gap: 7 (ref 5–15)
BUN: 13 mg/dL (ref 6–20)
CO2: 30 mmol/L (ref 22–32)
Calcium: 9.8 mg/dL (ref 8.9–10.3)
Chloride: 103 mmol/L (ref 98–111)
Creatinine, Ser: 1.07 mg/dL — ABNORMAL HIGH (ref 0.44–1.00)
GFR, Estimated: >60 mL/min (ref >60)
Glucose, Bld: 88 mg/dL (ref 70–99)
Potassium: 4.4 mmol/L (ref 3.5–5.1)
Sodium: 140 mmol/L (ref 135–145)

## 2021-09-13 LAB — BRAIN NATRIURETIC PEPTIDE: B Natriuretic Peptide: 87.2 pg/mL (ref 0.0–100.0)

## 2021-09-13 MED ORDER — ENTRESTO 24-26 MG PO TABS: 1.0000 | ORAL_TABLET | Freq: Two times a day (BID) | ORAL | 11 refills | Status: DC

## 2021-09-13 NOTE — Progress Notes (Signed)
Patient ID: Suzanne Long, female   DOB: Jun 11, 1971, 51 y.o.   MRN: [id number] PCP: Lennie Odor HF Cardiology: Dr. Aundra Dubin  51 y.o. with history of HTN and nonischemic cardiomyopathy presents for followup of CHF.      She was diagnosed with CHF initially in 2011.  At that time, she was hospitalized with volume overload.  Coronary angiography in 2011 showed no significant CAD, but EF was about 25%.  BP was very high.  She was started on cardiac meds and BP improved.  Per her report, EF improved.  She eventually stopped all her medications.  She was then hospitalized in 11/16 with CHF exacerbation. EF was 30-35% on 11/16 echo and 30-35% on 2/17 echo.   Echo in 7/17 showed EF up to 40%.   She stopped her medications again.  She was then admitted in 3/22 with atrial flutter/RVR and CHF.  Unsure how long atrial flutter was present as she did not feel palpitations.  She had TEE-guided DCCV.  TEE showed EF 20-25% with severely decreased RV function, mild MR.  LHC in 3/22 showed no significant CAD.  Cardiac MRI in 6/22 showed LV EF 31%, RVEF 52%, basal-mid lateral subendocardial LGE (>50% wall thickness), septal mid-wall LGE.  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 She was seen by Dr. Broadus John, and this gene mutation was actually not thought to be the cause of DMD in her family (she likely has a mutation that was not picked up by the [employer] testing).   In 7/22, she had atrial flutter ablation.   Echo in 10/22 showed EF 20-25% with moderate LV dilation, normal RV, normal IVC.   Patient went to the ER in 06/19/21 with SVT, ?atypical flutter with 2:1 block.  She was in the ER 07/11/21 with SVT rate >200, she was cardioverted by EMS and she was in NSR in the ER.  Zio monitor in 1/23 showed 8 runs NSVT, longest 19 beats and 23 runs SVT (?atrial tachycardia), longest 16 beats.  No atrial fibrillation.   Follow up 1/23 She felt Coreg eliminated palpitations better than Toprol XL.  She continued with chronic muscle ache with activity and BP on the low side. Hydralazine and Imdur were stopped.   Follow up with Dr. Curt Bears 1/23, discussed ICD but she is hesitant due to her keloids. Eliquis started.  Today she returns for HF follow up with her husband. Overall feeling fine. BP has been low and she remains fatigued, no dizziness. She does not have dyspnea walking on flat ground or walking up a flight of stairs. Denies abnormal bleeding, palpitations, CP,  edema, or PND/Orthopnea. Appetite ok. No fever or chills. She is not weighing at home. Taking all medications. BP at home ~80-90s/60-70s.  Labs (3/17): K 3.7, creatinine 0.84, SPEP negative, TSH normal.  Labs (5/17): K 3.9, creatinine 0.81, BNP normal Labs (3/22): K 5.2, creatinine 1.0 Labs (7/22): CK 1214 Labs (9/22): K 4.3, creatinine 1.04 Labs (12/22): TSH elevated at 6.6, free T4 normal, K 3.6, creatinine 1.14, BNP 161 Labs (1/23): K 4.3, creatinine 0.89  ECG (personally reviewed): none ordered.  PMH: 1. Chronic systolic CHF: 1st noted in 2011.  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  - LHC (2011) with no significant CAD, EF 25%.   - Echo (11/16) with EF 30-35%, diffuse hypokinesis. - Echo (2/17) with EF 30-35%, moderate  LVH, diffuse hypokinesis.  - Echo (7/17) with EF 40%, severe LV dilation, grade II diastolic dysfunction.  - TEE (3/22) with EF 20-25% with severely decreased RV function, mild MR - LHC (3/22): No significant CAD.  - Cardiac MRI (6/22): LV EF 31%, RVEF 52%, basal-mid lateral subendocardial LGE (>50% wall thickness), septal mid-wall LGE.   - Echo (10/22): EF 20-25% with moderate LV dilation, normal RV, normal IVC.  2. HTN 3. Hypothyroidism 4. Obesity.  5. Goiter 6. Atrial flutter: 3/22, s/p DCCV.  - Ablation in 7/22.  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  8. PVCs: Zio monitor (8/22) with 8% PVCs, no AF/AFL 9. SVT: noted in ER x 2 in 12/22, once possible atypical flutter and once cardioverted before reached ER.  - Zio (1/23): 8 runs NSVT, longest 19 beats and 23 runs SVT (?atrial tachycardia), longest 16 beats.  No atrial fibrillation.   SH:  Nonsmoker.  Rare ETOH.  No drugs.    FH: Mother, grandmother with HTN.  Uncle with MI. 2 sons with Duchenne muscular dystrophy (both have passed away).  Brother with Duchenne muscular dystrophy.  ROS: All systems reviewed and negative except as per HPI.   Current Outpatient Medications  Medication Sig Dispense Refill   carvedilol (COREG) 3.125 MG tablet Take 2 tablets (6.25 mg total) by mouth 2 (two) times daily. 60 tablet 3   empagliflozin (JARDIANCE) 10 MG TABS tablet Take 1 tablet (10 mg total) by mouth daily. 90 tablet 3   furosemide (LASIX) 40 MG tablet Take 1 tablet (40 mg total) by mouth daily. 90 tablet 3   Multiple Vitamin (MULTIVITAMIN WITH MINERALS) TABS tablet Take 1 tablet by mouth daily. As needed     polyvinyl alcohol (LIQUIFILM TEARS) 1.4 % ophthalmic solution Place 1 drop into both eyes 3 (three) times daily.     sacubitril-valsartan (ENTRESTO) 49-51 MG Take 1 tablet by mouth 2 (two) times daily. 60 tablet 3   spironolactone (ALDACTONE) 25 MG tablet Take 1 tablet (25 mg total) by mouth daily. 90 tablet 3   No current facility-administered medications for this encounter.   Wt Readings from Last 3 Encounters:  09/13/21 129.5 kg (285 lb 9.6 oz)  08/08/21 130.1 kg (286 lb 12.8 oz)  08/02/21 129.5 kg (285 lb 6.4 oz)   BP (!) 80/54    Pulse 90    Wt 129.5 kg (285 lb 9.6 oz)    SpO2 96%    BMI 42.79 kg/m  Physical Exam: General:  NAD. No resp difficulty HEENT: Normal Neck: Supple. No JVD. Carotids 2+ bilat; no bruits. No lymphadenopathy or thryomegaly appreciated. Cor: PMI nondisplaced. Regular rate & rhythm. No rubs, gallops or murmurs. Lungs: Clear Abdomen:  Soft, nontender, nondistended. No hepatosplenomegaly. No bruits or masses. Good bowel sounds. Extremities: No cyanosis, clubbing, rash, edema Neuro: Alert & oriented x 3, cranial nerves grossly intact. Moves all 4 extremities w/o difficulty. Affect pleasant.  Assessment/Plan: 1. Chronic systolic CHF: Nonischemic cardiomyopathy. No CAD on coronary angiography in 2011 and 3/22.  She had 2 sons and a brother with Duchenne's muscular dystrophy.  She notes chronic muscle weakness with squat->stand.  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  TEE in 3/22 showed EF 20-25% with severe RV dysfunction.  Cardiac MRI in 6/22 showed LV EF 31%, RVEF 52%, basal-mid lateral subendocardial LGE (>50% wall thickness), septal mid-wall LGE.  LGE, especially in the inferolateral wall, is seen  with DMD-related cardiomyopathy. Echo in 10/22 showed EF 20-25%, moderate LV dilation, normal RV.  She is not volume overloaded on exam.  NYHA class II symptoms. - Continue Lasix 40 mg daily.  BMET/BNP today.  - Continue Coreg 6.25 mg bid. - With low BP, decrease Entresto to 24/26 bid, (previously decreased from maximum dose due to orthostasis).   - Continue spironolactone 25 mg daily. - Continue Jardiance 10 mg daily.   - Off Imdur and hydralazine with low BP. - She discussed ICD with Dr. Curt Bears in 2/22, she is hesitant to proceed due to complications with her keloids. 2. HTN: BP now on the low side.   - Reduce Entresto as above.  3. H/o hypothyroidism: No thyroid replacement. Following with endocrinology for goiter. - TSH, free T3, free T4 1/23 ok.  4. Obesity: We discussed diet and exercise for weight loss again.   - her insurance will not cover semaglutide for weight loss. I will reach out to Pharmacy for other options. 5. Atrial flutter: Typical flutter in 3/22 with CHF decompensation.  Now s/p DCCV back to NSR and atrial flutter ablation. Recent runs of SVT, 1 may have  been atypical flutter. She is regular on exam today. - She says she is now off Eliquis. - ChadsVasc: 3 6. DMD heterozygote: I suspect this is the cause of her mild chronic muscle weakness. CPK has been mildly elevated.  7. PVCs: Frequent on 8/22 Zio monitor, 8% total. 5.7% on 1/23 Zio monitor.  - Not high enough percentage to warrant amiodarone.  - Continue Coreg as above.  8. SVT: Noted on Zio in 1/23, ? atrial tachy.  - Continue Coreg.  - Follows with EP.  Followup in 3 months with Dr. Aundra Dubin.  [place] FNP 09/13/2021

## 2021-09-13 NOTE — Patient Instructions (Signed)
Labs done today. We will contact you only if your labs are abnormal. ? ?DECREASE Entresto to 24-$RemoveBefore'26mg'[id number]$  (1 tablet) by mouth 2 times daily.  ? ?No other medication changes were made. Please continue all current medications as prescribed. ? ?Your physician recommends that you schedule a follow-up appointment in: 3 months with Dr. Aundra Dubin ? ?If you have any questions or concerns before your next appointment please send [place] a message through [place] or call our office at [phone].   ? ?TO LEAVE A MESSAGE FOR THE NURSE SELECT OPTION 2, PLEASE LEAVE A MESSAGE INCLUDING: ?YOUR NAME ?DATE OF BIRTH ?CALL BACK NUMBER ?REASON FOR CALL**this is important as we prioritize the call backs ? ?YOU WILL RECEIVE A CALL BACK THE SAME DAY AS LONG AS YOU CALL BEFORE 4:00 PM ? ? ?Do the following things EVERYDAY: ?Weigh yourself in the morning before breakfast. Write it down and keep it in a log. ?Take your medicines as prescribed ?Eat low salt foods--Limit salt (sodium) to 2000 mg per day.  ?Stay as active as you can everyday ?Limit all fluids for the day to less than 2 liters ? ? ?At the [facility], you and your health needs are our priority. As part of our continuing mission to provide you with exceptional heart care, we have created designated Provider Care Teams. These Care Teams include your primary Cardiologist (physician) and Advanced Practice Providers (APPs- Physician Assistants and Nurse Practitioners) who all work together to provide you with the care you need, when you need it.  ? ?You may see any of the following providers on your designated Care Team at your next follow up: ?Dr Glori Bickers ?Dr Loralie Champagne ?Darrick Grinder, NP ?Lyda Jester, PA ?Audry Riles, PharmD ? ? ?Please be sure to bring in all your medications bottles to every appointment.  ? ?

## 2021-10-15 ENCOUNTER — Encounter (HOSPITAL_COMMUNITY): Payer: Self-pay | Admitting: Cardiology

## 2021-12-11 ENCOUNTER — Telehealth: Payer: Self-pay | Admitting: *Deleted

## 2021-12-11 ENCOUNTER — Other Ambulatory Visit: Payer: Self-pay | Admitting: Cardiology

## 2021-12-11 NOTE — Telephone Encounter (Signed)
Left message to call back  (Discuss if pt ever started Eliquis)

## 2021-12-12 MED ORDER — EMPAGLIFLOZIN 10 MG PO TABS: 10.0000 mg | ORAL_TABLET | Freq: Every day | ORAL | 1 refills | Status: DC

## 2021-12-17 ENCOUNTER — Encounter (HOSPITAL_COMMUNITY): Payer: Self-pay | Admitting: Cardiology

## 2021-12-17 ENCOUNTER — Ambulatory Visit (HOSPITAL_COMMUNITY)
Admission: RE | Admit: 2021-12-17 | Discharge: 2021-12-17 | Disposition: A | Discharge status: Home / Self Care | Source: Ambulatory Visit | Attending: Cardiology | Admitting: Cardiology

## 2021-12-17 VITALS — BP 124/70 | HR 67 | Wt 280.6 lb

## 2021-12-17 DIAGNOSIS — Z7984 Long term (current) use of oral hypoglycemic drugs: Secondary | ICD-10-CM | POA: Insufficient documentation

## 2021-12-17 DIAGNOSIS — I471 Supraventricular tachycardia: Secondary | ICD-10-CM | POA: Diagnosis not present

## 2021-12-17 DIAGNOSIS — I428 Other cardiomyopathies: Secondary | ICD-10-CM | POA: Diagnosis not present

## 2021-12-17 DIAGNOSIS — Z79899 Other long term (current) drug therapy: Secondary | ICD-10-CM | POA: Diagnosis not present

## 2021-12-17 DIAGNOSIS — I110 Hypertensive heart disease with heart failure: Secondary | ICD-10-CM | POA: Insufficient documentation

## 2021-12-17 DIAGNOSIS — E039 Hypothyroidism, unspecified: Secondary | ICD-10-CM | POA: Diagnosis not present

## 2021-12-17 DIAGNOSIS — E049 Nontoxic goiter, unspecified: Secondary | ICD-10-CM | POA: Diagnosis not present

## 2021-12-17 DIAGNOSIS — G7101 Duchenne or Becker muscular dystrophy: Secondary | ICD-10-CM | POA: Insufficient documentation

## 2021-12-17 DIAGNOSIS — E669 Obesity, unspecified: Secondary | ICD-10-CM | POA: Diagnosis not present

## 2021-12-17 DIAGNOSIS — I4892 Unspecified atrial flutter: Secondary | ICD-10-CM | POA: Insufficient documentation

## 2021-12-17 DIAGNOSIS — I493 Ventricular premature depolarization: Secondary | ICD-10-CM | POA: Diagnosis not present

## 2021-12-17 DIAGNOSIS — I5022 Chronic systolic (congestive) heart failure: Secondary | ICD-10-CM | POA: Insufficient documentation

## 2021-12-17 LAB — TSH: TSH: 1.61 u[IU]/mL (ref 0.350–4.500)

## 2021-12-17 LAB — BASIC METABOLIC PANEL
Anion gap: 7 (ref 5–15)
BUN: 6 mg/dL (ref 6–20)
CO2: 27 mmol/L (ref 22–32)
Calcium: 9 mg/dL (ref 8.9–10.3)
Chloride: 106 mmol/L (ref 98–111)
Creatinine, Ser: 0.91 mg/dL (ref 0.44–1.00)
GFR, Estimated: >60 mL/min (ref >60)
Glucose, Bld: 91 mg/dL (ref 70–99)
Potassium: 4.4 mmol/L (ref 3.5–5.1)
Sodium: 140 mmol/L (ref 135–145)

## 2021-12-17 LAB — T4, FREE: Free T4: 0.9 ng/dL (ref 0.61–1.12)

## 2021-12-17 MED ORDER — FUROSEMIDE 20 MG PO TABS: 20.0000 mg | ORAL_TABLET | Freq: Every day | ORAL | 3 refills | Status: DC

## 2021-12-17 MED ORDER — ENTRESTO 49-51 MG PO TABS: 1.0000 | ORAL_TABLET | Freq: Two times a day (BID) | ORAL | 3 refills | Status: DC

## 2021-12-17 NOTE — Patient Instructions (Signed)
Decrease Lasix to $Remove'20mg'KicsueB$  daily.  Increase Entresto to 49/81 Twice daily  Labs done today, your results will be available in MyChart, we will contact you for abnormal readings.  Repeat blood in 10 days.  Your physician recommends that you schedule a follow-up appointment in: 3 months.  If you have any questions or concerns before your next appointment please send [place] a message through [place] or call our office at [phone].    TO LEAVE A MESSAGE FOR THE NURSE SELECT OPTION 2, PLEASE LEAVE A MESSAGE INCLUDING: YOUR NAME DATE OF BIRTH CALL BACK NUMBER REASON FOR CALL**this is important as we prioritize the call backs  YOU WILL RECEIVE A CALL BACK THE SAME DAY AS LONG AS YOU CALL BEFORE 4:00 PM  At the [facility], you and your health needs are our priority. As part of our continuing mission to provide you with exceptional heart care, we have created designated Provider Care Teams. These Care Teams include your primary Cardiologist (physician) and Advanced Practice Providers (APPs- Physician Assistants and Nurse Practitioners) who all work together to provide you with the care you need, when you need it.   You may see any of the following providers on your designated Care Team at your next follow up: Dr Glori Bickers Dr Haynes Kerns, NP Lyda Jester, [place] [facility] [place], [place] Audry Riles, PharmD   Please be sure to bring in all your medications bottles to every appointment.

## 2021-12-17 NOTE — Progress Notes (Signed)
Patient ID: Suzanne Long, female   DOB: 01/28/1971, 51 y.o.   MRN: [id number] PCP: Suzanne Long HF Cardiology: Dr. Aundra Dubin  51 y.o. with history of HTN and nonischemic cardiomyopathy presents for followup of CHF.      She was diagnosed with CHF initially in 2011.  At that time, she was hospitalized with volume overload.  Coronary angiography in 2011 showed no significant CAD, but EF was about 25%.  BP was very high.  She was started on cardiac meds and BP improved.  Per her report, EF improved.  She eventually stopped all her medications.  She was then hospitalized in 11/16 with CHF exacerbation. EF was 30-35% on 11/16 echo and 30-35% on 2/17 echo.   Echo in 7/17 showed EF up to 40%.   She stopped her medications again.  She was then admitted in 3/22 with atrial flutter/RVR and CHF.  Unsure how long atrial flutter was present as she did not feel palpitations.  She had TEE-guided DCCV.  TEE showed EF 20-25% with severely decreased RV function, mild MR.  LHC in 3/22 showed no significant CAD.  Cardiac MRI in 6/22 showed LV EF 31%, RVEF 52%, basal-mid lateral subendocardial LGE (>50% wall thickness), septal mid-wall LGE.  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 She was seen by Dr. Broadus John, and this gene mutation was actually not thought to be the cause of DMD in her family (she likely has a mutation that was not picked up by the [employer] testing).   In 7/22, she had atrial flutter ablation.   Echo in 10/22 showed EF 20-25% with moderate LV dilation, normal RV, normal IVC.   Patient went to the ER in 06/19/21 with SVT, ?atypical flutter with 2:1 block.  She was in the ER 07/11/21 with SVT rate >200, she was cardioverted by EMS and she was in NSR in the ER.  Zio monitor in 1/23 showed 8 runs NSVT, longest 19 beats and 23 runs SVT (?atrial tachycardia), longest 16 beats.  No atrial fibrillation.   Follow up 1/23 She felt Coreg eliminated palpitations better than Toprol XL.  She continued with chronic muscle ache with activity and BP on the low side. Hydralazine and Imdur were stopped.   Follow up with Dr. Curt Bears 1/23, discussed ICD but she is hesitant due to her keloids. Eliquis stopped.   Today she returns for HF follow up with her husband. Weight is up 3 lbs.  She denies dyspnea walking on flat ground or up 1 flight of stairs.  No orthopnea/PND.  No chest pain.  No orthopnea/PND.  No palpitations or lightheadedness.  Generally feels like she is doing well.    Labs (3/17): K 3.7, creatinine 0.84, SPEP negative, TSH normal.  Labs (5/17): K 3.9, creatinine 0.81, BNP normal Labs (3/22): K 5.2, creatinine 1.0 Labs (7/22): CK 1214 Labs (9/22): K 4.3, creatinine 1.04 Labs (12/22): TSH elevated at 6.6, free T4 normal, K 3.6, creatinine 1.14, BNP 161 Labs (1/23): K 4.3, creatinine 0.89 Labs (3/23): K 4.4, creatinine 1.07, BNP 87  ECG (personally reviewed): NSR, poor RWP, PVC  PMH: 1. Chronic systolic CHF: 1st noted in 2011.  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  - LHC (2011) with no significant CAD, EF 25%.   - Echo (11/16) with EF 30-35%, diffuse hypokinesis. - Echo (2/17) with EF 30-35%, moderate LVH, diffuse  hypokinesis.  - Echo (7/17) with EF 40%, severe LV dilation, grade II diastolic dysfunction.  - TEE (3/22) with EF 20-25% with severely decreased RV function, mild MR - LHC (3/22): No significant CAD.  - Cardiac MRI (6/22): LV EF 31%, RVEF 52%, basal-mid lateral subendocardial LGE (>50% wall thickness), septal mid-wall LGE.   - Echo (10/22): EF 20-25% with moderate LV dilation, normal RV, normal IVC.  2. HTN 3. Hypothyroidism 4. Obesity.  5. Goiter 6. Atrial flutter: 3/22, s/p DCCV.  - Ablation in 7/22.  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  8. PVCs: Zio monitor (8/22) with 8% PVCs, no  AF/AFL 9. SVT: noted in ER x 2 in 12/22, once possible atypical flutter and once cardioverted before reached ER.  - Zio (1/23): 8 runs NSVT, longest 19 beats and 23 runs SVT (?atrial tachycardia), longest 16 beats.  No atrial fibrillation.   SH:  Nonsmoker.  Rare ETOH.  No drugs.    FH: Mother, grandmother with HTN.  Uncle with MI. 2 sons with Duchenne muscular dystrophy (both have passed away).  Brother with Duchenne muscular dystrophy.  ROS: All systems reviewed and negative except as per HPI.   Current Outpatient Medications  Medication Sig Dispense Refill   carvedilol (COREG) 3.125 MG tablet Take 2 tablets (6.25 mg total) by mouth 2 (two) times daily. 60 tablet 3   empagliflozin (JARDIANCE) 10 MG TABS tablet Take 1 tablet (10 mg total) by mouth daily. 90 tablet 1   Multiple Vitamin (MULTIVITAMIN WITH MINERALS) TABS tablet Take 1 tablet by mouth daily. As needed     polyvinyl alcohol (LIQUIFILM TEARS) 1.4 % ophthalmic solution Place 1 drop into both eyes 3 (three) times daily.     sacubitril-valsartan (ENTRESTO) 49-51 MG Take 1 tablet by mouth 2 (two) times daily. 180 tablet 3   spironolactone (ALDACTONE) 25 MG tablet Take 1 tablet (25 mg total) by mouth daily. 90 tablet 3   furosemide (LASIX) 20 MG tablet Take 1 tablet (20 mg total) by mouth daily. 90 tablet 3   No current facility-administered medications for this encounter.   Wt Readings from Last 3 Encounters:  12/17/21 127.3 kg (280 lb 9.6 oz)  09/13/21 129.5 kg (285 lb 9.6 oz)  08/08/21 130.1 kg (286 lb 12.8 oz)   BP 124/70   Pulse 67   Wt 127.3 kg (280 lb 9.6 oz)   SpO2 98%   BMI 42.04 kg/m  General: NAD Neck: No JVD, diffusely enlarged thyroid.  Lungs: Clear to auscultation bilaterally with normal respiratory effort. CV: Nondisplaced PMI.  Heart regular S1/S2, no S3/S4, no murmur.  No peripheral edema.  No carotid bruit.  Normal pedal pulses.  Abdomen: Soft, nontender, no hepatosplenomegaly, no distention.  Skin:  Intact without lesions or rashes.  Neurologic: Alert and oriented x 3.  Psych: Normal affect. Extremities: No clubbing or cyanosis.  HEENT: Normal.   Assessment/Plan: 1. Chronic systolic CHF: Nonischemic cardiomyopathy. No CAD on coronary angiography in 2011 and 3/22.  She had 2 sons and a brother with Duchenne's muscular dystrophy.  She notes chronic muscle weakness with squat->stand.  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  TEE in 3/22 showed EF 20-25% with severe RV dysfunction.  Cardiac MRI in 6/22 showed LV EF 31%, RVEF 52%, basal-mid lateral subendocardial LGE (>50% wall thickness), septal mid-wall LGE.  LGE,  especially in the inferolateral wall, is seen with DMD-related cardiomyopathy. Echo in 10/22 showed EF 20-25%, moderate LV dilation, normal RV.  She is not volume overloaded on exam.  NYHA class I-II symptoms. - Continue Coreg 6.25 mg bid. - I will have her increase Entresto back to 49/51 bid and decrease Lasix to 20 mg daily.  BMET today and in 10 days.  - Continue spironolactone 25 mg daily. - Continue Jardiance 10 mg daily.   - Off Imdur and hydralazine with low BP. - She does not want to have an ICD placed though I strongly recommended it given significant LGE present on cMRI.  She is very concerned about keloid formation, apparently very pruritic and bothersome when these have formed on her in the past.   2. HTN: BP controlled.  3. H/o hypothyroidism: No thyroid replacement. Following with endocrinology for goiter. - TSH, free T3, free T4 today.  4. Obesity: We discussed diet and exercise for weight loss again.   - her insurance will not cover semaglutide for weight loss. 5. Atrial flutter: Typical flutter in 3/22 with CHF decompensation.  Now s/p DCCV back to NSR and atrial flutter ablation. She is off Eliquis since AFL ablation. NSR today.  6. DMD heterozygote:  I suspect this is the cause of her mild chronic muscle weakness. CPK has been mildly elevated.  7. PVCs: Frequent on 8/22 Zio monitor, 8% total. 5.7% on 1/23 Zio monitor.  - Not high enough percentage to warrant amiodarone.  - Continue Coreg as above.  8. SVT: Noted on Zio in 1/23, ? atrial tachy.  - Continue Coreg.   Followup in 3 months with NP.  Loralie Champagne  12/17/2021

## 2021-12-18 LAB — T3, FREE: T3, Free: 3.1 pg/mL (ref 2.0–4.4)

## 2021-12-19 ENCOUNTER — Other Ambulatory Visit (HOSPITAL_COMMUNITY): Payer: Self-pay | Admitting: Cardiology

## 2021-12-20 ENCOUNTER — Other Ambulatory Visit (HOSPITAL_COMMUNITY): Payer: Self-pay

## 2021-12-27 ENCOUNTER — Ambulatory Visit (HOSPITAL_COMMUNITY)
Admission: RE | Admit: 2021-12-27 | Discharge: 2021-12-27 | Disposition: A | Discharge status: Home / Self Care | Source: Ambulatory Visit | Attending: Internal Medicine | Admitting: Internal Medicine

## 2021-12-27 DIAGNOSIS — I5022 Chronic systolic (congestive) heart failure: Secondary | ICD-10-CM | POA: Insufficient documentation

## 2021-12-27 LAB — BASIC METABOLIC PANEL
Anion gap: 5 (ref 5–15)
BUN: 11 mg/dL (ref 6–20)
CO2: 28 mmol/L (ref 22–32)
Calcium: 9.2 mg/dL (ref 8.9–10.3)
Chloride: 106 mmol/L (ref 98–111)
Creatinine, Ser: 0.94 mg/dL (ref 0.44–1.00)
GFR, Estimated: >60 mL/min (ref >60)
Glucose, Bld: 97 mg/dL (ref 70–99)
Potassium: 4.1 mmol/L (ref 3.5–5.1)
Sodium: 139 mmol/L (ref 135–145)

## 2022-02-19 ENCOUNTER — Encounter (INDEPENDENT_AMBULATORY_CARE_PROVIDER_SITE_OTHER): Payer: Self-pay

## 2022-02-23 ENCOUNTER — Ambulatory Visit (INDEPENDENT_AMBULATORY_CARE_PROVIDER_SITE_OTHER)

## 2022-02-23 ENCOUNTER — Ambulatory Visit (HOSPITAL_COMMUNITY)
Admission: EM | Admit: 2022-02-23 | Discharge: 2022-02-23 | Disposition: A | Discharge status: Home / Self Care | Attending: Internal Medicine | Admitting: Internal Medicine

## 2022-02-23 ENCOUNTER — Encounter (HOSPITAL_COMMUNITY): Payer: Self-pay | Admitting: Emergency Medicine

## 2022-02-23 DIAGNOSIS — W19XXXA Unspecified fall, initial encounter: Secondary | ICD-10-CM

## 2022-02-23 DIAGNOSIS — S90811A Abrasion, right foot, initial encounter: Secondary | ICD-10-CM | POA: Diagnosis not present

## 2022-02-23 DIAGNOSIS — M25562 Pain in left knee: Secondary | ICD-10-CM

## 2022-02-23 DIAGNOSIS — M79642 Pain in left hand: Secondary | ICD-10-CM | POA: Diagnosis not present

## 2022-02-23 DIAGNOSIS — M79671 Pain in right foot: Secondary | ICD-10-CM

## 2022-02-23 DIAGNOSIS — M79641 Pain in right hand: Secondary | ICD-10-CM | POA: Diagnosis not present

## 2022-02-23 DIAGNOSIS — M25561 Pain in right knee: Secondary | ICD-10-CM | POA: Diagnosis not present

## 2022-02-23 DIAGNOSIS — S80212A Abrasion, left knee, initial encounter: Secondary | ICD-10-CM

## 2022-02-23 MED ORDER — TETANUS-DIPHTH-ACELL PERTUSSIS 5-2.5-18.5 LF-MCG/0.5 IM SUSY
0.5000 mL | PREFILLED_SYRINGE | Freq: Once | INTRAMUSCULAR | Status: AC
  Administered 2022-02-23: 0.5 mL via INTRAMUSCULAR

## 2022-02-23 MED ORDER — TETANUS-DIPHTH-ACELL PERTUSSIS 5-2.5-18.5 LF-MCG/0.5 IM SUSY
PREFILLED_SYRINGE | INTRAMUSCULAR | Status: AC
  Filled 2022-02-23: qty 0.5

## 2022-02-23 MED ORDER — BACITRACIN ZINC 500 UNIT/GM EX OINT
TOPICAL_OINTMENT | CUTANEOUS | Status: AC
  Filled 2022-02-23: qty 28.35

## 2022-02-23 NOTE — Discharge Instructions (Signed)
Your x-rays are normal.  Recommend ice application and rest.  Please monitor for signs of infection that include increased redness, swelling, pus.  Follow-up if this occurs.

## 2022-02-23 NOTE — ED Notes (Signed)
Cleansed wound, and applied gauze with coban. Bleeding controlled at this time.

## 2022-02-23 NOTE — ED Provider Notes (Signed)
[facility]    CSN: [id number] Arrival date & time: 02/23/22  1658      History   Chief Complaint Chief Complaint  Patient presents with   Fall    HPI Suzanne Long is a 51 y.o. female.   Patient presents for further evaluation of bilateral hand pain, bilateral knee pain, right foot pain after a fall that occurred about an hour prior to arrival to urgent care.  Patient reports that she was walking in [place] when she tripped over rocks and fell landing directly on her hands and knees.  She is having pain in the right foot, bilateral knees, palms of hands.  Patient has abrasions to right foot and bilateral knees as well.  She does not take any blood thinning medications.  Denies hitting head or losing consciousness.  Last tetanus vaccine was about 5 years ago but patient is not sure of this.   Fall    Past Medical History:  Diagnosis Date   CHF (congestive heart failure) (HCC)    Dyspnea    Hypertension    Hypothyroidism    Joint pain    Multiple food allergies    NICM (nonischemic cardiomyopathy) (HCC)    a. Echo 2/17:  moderate LVH, EF 30-35%, diffuse HK, moderate LAE   NICM (nonischemic cardiomyopathy) (HCC) 09/26/2020   Non-ST elevation (NSTEMI) myocardial infarction (HCC) 09/26/2020    Patient Active Problem List   Diagnosis Date Noted   Non-ST elevation (NSTEMI) myocardial infarction (HCC) 09/26/2020   Anticoagulation adequate 09/26/2020   NICM (nonischemic cardiomyopathy) (HCC) 09/26/2020   Atrial flutter (HCC)    Insulin resistance 07/22/2019   Other hyperlipidemia 07/22/2019   Vitamin D deficiency 07/22/2019   Goiter 02/01/2016   Chronic systolic CHF (congestive heart failure) (HCC) 11/22/2015   Elevated troponin 05/29/2015   CHF exacerbation (HCC) 05/29/2015   Accelerated hypertension    Noncompliance with medication regimen    Papilledema 07/27/2014   Acute on chronic systolic and diastolic heart failure, NYHA class 1 (HCC)  02/09/2014   Essential hypertension 02/09/2014   Class 3 severe obesity with serious comorbidity and body mass index (BMI) of 40.0 to 44.9 in adult [facility]) 02/09/2014   Subclinical hypothyroidism 02/09/2014    Past Surgical History:  Procedure Laterality Date   A-FLUTTER ABLATION N/A 01/22/2021   Procedure: A-FLUTTER ABLATION;  Surgeon: Regan Lemming, MD;  Location: [facility];  Service: Cardiovascular;  Laterality: N/A;   BUBBLE STUDY  09/26/2020   Procedure: BUBBLE STUDY;  Surgeon: Meriam Sprague, MD;  Location: [facility] ENDOSCOPY;  Service: Cardiovascular;;   CARDIOVERSION N/A 09/26/2020   Procedure: CARDIOVERSION;  Surgeon: Meriam Sprague, MD;  Location: [facility] ENDOSCOPY;  Service: Cardiovascular;  Laterality: N/A;   LEFT HEART CATH AND CORONARY ANGIOGRAPHY N/A 09/24/2020   Procedure: LEFT HEART CATH AND CORONARY ANGIOGRAPHY;  Surgeon: Runell Gess, MD;  Location: [facility];  Service: Cardiovascular;  Laterality: N/A;   MYOMECTOMY     None     TEE WITHOUT CARDIOVERSION N/A 09/26/2020   Procedure: TRANSESOPHAGEAL ECHOCARDIOGRAM (TEE);  Surgeon: Meriam Sprague, MD;  Location: [facility] ENDOSCOPY;  Service: Cardiovascular;  Laterality: N/A;    OB History     Gravida  2   Para      Term      Preterm      AB      Living  2      SAB      IAB  Ectopic      Multiple      Live Births               Home Medications    Prior to Admission medications   Medication Sig Start Date End Date Taking? Authorizing Provider  carvedilol (COREG) 3.125 MG tablet TAKE 2 TABLETS(6.25 MG) BY MOUTH TWICE DAILY 12/20/21   Larey Dresser, MD  empagliflozin (JARDIANCE) 10 MG TABS tablet Take 1 tablet (10 mg total) by mouth daily. 12/12/21   Camnitz, Ocie Doyne, MD  furosemide (LASIX) 20 MG tablet Take 1 tablet (20 mg total) by mouth daily. 12/17/21   Larey Dresser, MD  Multiple Vitamin (MULTIVITAMIN WITH MINERALS) TABS tablet Take 1 tablet by mouth daily.  As needed    [provider]  polyvinyl alcohol (LIQUIFILM TEARS) 1.4 % ophthalmic solution Place 1 drop into both eyes 3 (three) times daily.    [provider]  sacubitril-valsartan (ENTRESTO) 49-51 MG Take 1 tablet by mouth 2 (two) times daily. 12/17/21   Larey Dresser, MD  spironolactone (ALDACTONE) 25 MG tablet Take 1 tablet (25 mg total) by mouth daily. 12/31/20   Larey Dresser, MD    Family History Family History  Problem Relation Age of Onset   Diabetes Mother    Hypertension Mother    Depression Mother    Obesity Mother    Muscular dystrophy Brother    Kidney failure Brother    Muscular dystrophy Son    Migraines Neg Hx    Multiple sclerosis Neg Hx    Thyroid disease Neg Hx     Social History Social History   Tobacco Use   Smoking status: Never   Smokeless tobacco: Never  Vaping Use   Vaping Use: Never used  Substance Use Topics   Alcohol use: Yes    Alcohol/week: 0.0 standard drinks of alcohol    Comment: Occasional   Drug use: No     Allergies   Shellfish allergy, Gadolinium derivatives, Iodine, and Latex   Review of Systems Review of Systems Per HPI  Physical Exam Triage Vital Signs ED Triage Vitals  Enc Vitals Group     BP 02/23/22 1752 (!) 152/94     Pulse Rate 02/23/22 1752 92     Resp 02/23/22 1752 17     Temp 02/23/22 1752 98.5 F (36.9 C)     Temp Source 02/23/22 1752 Oral     SpO2 02/23/22 1752 99 %     Weight --      Height --      Head Circumference --      Peak Flow --      Pain Score 02/23/22 1751 8     Pain Loc --      Pain Edu? --      Excl. in [place]? --    No data found.  Updated Vital Signs BP (!) 152/94 (BP Location: Left Arm)   Pulse 92   Temp 98.5 F (36.9 C) (Oral)   Resp 17   SpO2 99%   Visual Acuity Right Eye Distance:   Left Eye Distance:   Bilateral Distance:    Right Eye Near:   Left Eye Near:    Bilateral Near:     Physical Exam Constitutional:      General: She is not in acute  distress.    Appearance: Normal appearance. She is not toxic-appearing or diaphoretic.  HENT:     Head: Normocephalic and atraumatic.  Eyes:     Extraocular Movements: Extraocular movements intact.     Conjunctiva/sclera: Conjunctivae normal.  Pulmonary:     Effort: Pulmonary effort is normal.  Musculoskeletal:     Right knee: No LCL laxity, MCL laxity, ACL laxity or PCL laxity. Normal alignment, normal meniscus and normal patellar mobility. Normal pulse.     Left knee: No crepitus. No LCL laxity, MCL laxity, ACL laxity or PCL laxity.Normal alignment, normal meniscus and normal patellar mobility. Normal pulse.     Comments: Tenderness to palpation across anterior knees bilaterally.  Mild swelling noted bilaterally.  Patient has full range of motion of knees but with pain.  Tenderness to palpation surrounding abrasion directly below right first toe on right foot.  Patient can wiggle toes and has full range of motion of foot.  No tenderness to left foot or bilateral ankles.  No swelling noted.  Capillary refill and pulses intact.  Tenderness to palpation with associated erythema to palmar surface of wrist.  No tenderness to majority of fingers and hands.  No tenderness to wrist.  Grip strength 5/5.  Capillary refill and pulses intact.  Skin:    Comments: Approximately 1.5 cm in diameter superficial abrasion present to dorsal surface of foot directly below right great toe.  Bleeding controlled.  Similar sized abrasion present to anterior upper right knee.  Bleeding controlled.  Patient has superficial abrasion present to left anterior knee.  Bleeding controlled. Abrasion spreads throughout anterior knee.   Neurological:     General: No focal deficit present.     Mental Status: She is alert and oriented to person, place, and time. Mental status is at baseline.  Psychiatric:        Mood and Affect: Mood normal.        Behavior: Behavior normal.        Thought Content: Thought content normal.         Judgment: Judgment normal.      UC Treatments / Results  Labs (all labs ordered are listed, but only abnormal results are displayed) Labs Reviewed - No data to display  EKG   Radiology DG Knee AP/LAT W/Sunrise Right  Result Date: 02/23/2022 CLINICAL DATA:  Trauma, fall EXAM: RIGHT KNEE 3 VIEWS COMPARISON:  None Available. FINDINGS: No recent fracture or dislocation is seen. Small bony spurs are seen in lateral and patellofemoral compartments. There is soft tissue prominence in the suprapatellar bursa suggesting possible small effusion. IMPRESSION: No recent fracture or dislocation is seen. Degenerative changes are noted with small bony spurs in lateral and patellofemoral compartments. Possible small effusion is present in suprapatellar bursa. Electronically Signed   By: Ernie Avena M.D.   On: 02/23/2022 18:48   DG Foot Complete Right  Result Date: 02/23/2022 CLINICAL DATA:  Fall EXAM: RIGHT FOOT COMPLETE - 3+ VIEW COMPARISON:  None Available. FINDINGS: There is no evidence of fracture or dislocation. There is no evidence of arthropathy or other focal bone abnormality. Soft tissues are unremarkable. IMPRESSION: Negative. Electronically Signed   By: Darliss Cheney M.D.   On: 02/23/2022 18:48   DG Hand Complete Left  Result Date: 02/23/2022 CLINICAL DATA:  Bilateral hand pain after fall. EXAM: LEFT HAND - COMPLETE 3+ VIEW; RIGHT HAND - COMPLETE 3+ VIEW COMPARISON:  None Available. FINDINGS: There is no evidence of fracture or dislocation. There is no evidence of arthropathy or other focal bone abnormality. Soft tissues are unremarkable. IMPRESSION: Negative. Electronically Signed   By: Christell Constant.D.  On: 02/23/2022 18:48   DG Hand Complete Right  Result Date: 02/23/2022 CLINICAL DATA:  Bilateral hand pain after fall. EXAM: LEFT HAND - COMPLETE 3+ VIEW; RIGHT HAND - COMPLETE 3+ VIEW COMPARISON:  None Available. FINDINGS: There is no evidence of fracture or dislocation.  There is no evidence of arthropathy or other focal bone abnormality. Soft tissues are unremarkable. IMPRESSION: Negative. Electronically Signed   By: Dahlia Bailiff M.D.   On: 02/23/2022 18:48   DG Knee AP/LAT W/Sunrise Left  Result Date: 02/23/2022 CLINICAL DATA:  Bilateral knee pain.  Fall. EXAM: LEFT KNEE 3 VIEWS COMPARISON:  None Available. FINDINGS: No evidence of fracture, dislocation, or joint effusion. No evidence of arthropathy or other focal bone abnormality. There is soft tissue laceration anterior to the inferior patella. No radiopaque foreign body. IMPRESSION: 1. Soft tissue laceration of the anterior knee. 2. No acute bony abnormality. Electronically Signed   By: Ronney Asters M.D.   On: 02/23/2022 18:46    Procedures Procedures (including critical care time)  Medications Ordered in UC Medications  Tdap (BOOSTRIX) injection 0.5 mL (0.5 mLs Intramuscular Given 02/23/22 1834)    Initial Impression / Assessment and Plan / UC Course  I have reviewed the triage vital signs and the nursing notes.  Pertinent labs & imaging results that were available during my care of the patient were reviewed by me and considered in my medical decision making (see chart for details).     All x-rays are negative for any acute fracture or dislocation.  There is a small joint effusion to the right knee.  Discussed ice application, elevation of extremities, wound care with patient.  Nonadherent dressings with antibiotic ointment were applied prior to discharge.  Tetanus vaccine updated.  Patient advised to monitor for signs of infection to abrasions and to follow-up if this occurs.  All of patient's abrasions are superficial and no sutures or closure is necessary.  Discussed return precautions.  Patient verbalized understanding and was agreeable with plan. Final Clinical Impressions(s) / UC Diagnoses   Final diagnoses:  Fall, initial encounter  Acute pain of both knees  Right foot pain  Bilateral hand  pain  Abrasion of right foot, initial encounter  Abrasion of left knee, initial encounter     Discharge Instructions      Your x-rays are normal.  Recommend ice application and rest.  Please monitor for signs of infection that include increased redness, swelling, pus.  Follow-up if this occurs.     ED Prescriptions   None    PDMP not reviewed this encounter.   Teodora Medici, [place] 02/23/22 1902

## 2022-02-23 NOTE — ED Triage Notes (Signed)
Pt c/o bilat knee pain, right foot pain and bilat hand pain from fall today. Has left knee wrapped up controlling bleeding.

## 2022-03-18 ENCOUNTER — Ambulatory Visit (HOSPITAL_COMMUNITY)
Admission: RE | Admit: 2022-03-18 | Discharge: 2022-03-18 | Disposition: A | Discharge status: Home / Self Care | Source: Ambulatory Visit | Attending: Family Medicine | Admitting: Family Medicine

## 2022-03-18 ENCOUNTER — Encounter (HOSPITAL_COMMUNITY): Payer: Self-pay

## 2022-03-18 VITALS — BP 98/64 | HR 71 | Ht 68.5 in | Wt 283.2 lb

## 2022-03-18 DIAGNOSIS — R000 Tachycardia, unspecified: Secondary | ICD-10-CM | POA: Insufficient documentation

## 2022-03-18 DIAGNOSIS — E039 Hypothyroidism, unspecified: Secondary | ICD-10-CM | POA: Diagnosis not present

## 2022-03-18 DIAGNOSIS — E669 Obesity, unspecified: Secondary | ICD-10-CM | POA: Diagnosis not present

## 2022-03-18 DIAGNOSIS — I471 Supraventricular tachycardia: Secondary | ICD-10-CM

## 2022-03-18 DIAGNOSIS — Z7984 Long term (current) use of oral hypoglycemic drugs: Secondary | ICD-10-CM | POA: Insufficient documentation

## 2022-03-18 DIAGNOSIS — I483 Typical atrial flutter: Secondary | ICD-10-CM | POA: Diagnosis not present

## 2022-03-18 DIAGNOSIS — I428 Other cardiomyopathies: Secondary | ICD-10-CM | POA: Insufficient documentation

## 2022-03-18 DIAGNOSIS — I493 Ventricular premature depolarization: Secondary | ICD-10-CM

## 2022-03-18 DIAGNOSIS — M6281 Muscle weakness (generalized): Secondary | ICD-10-CM | POA: Insufficient documentation

## 2022-03-18 DIAGNOSIS — Z6841 Body Mass Index (BMI) 40.0 and over, adult: Secondary | ICD-10-CM | POA: Diagnosis not present

## 2022-03-18 DIAGNOSIS — I110 Hypertensive heart disease with heart failure: Secondary | ICD-10-CM | POA: Diagnosis not present

## 2022-03-18 DIAGNOSIS — Z79899 Other long term (current) drug therapy: Secondary | ICD-10-CM | POA: Insufficient documentation

## 2022-03-18 DIAGNOSIS — I5022 Chronic systolic (congestive) heart failure: Secondary | ICD-10-CM | POA: Diagnosis not present

## 2022-03-18 DIAGNOSIS — I10 Essential (primary) hypertension: Secondary | ICD-10-CM

## 2022-03-18 LAB — BASIC METABOLIC PANEL
Anion gap: 7 (ref 5–15)
BUN: 8 mg/dL (ref 6–20)
CO2: 26 mmol/L (ref 22–32)
Calcium: 8.9 mg/dL (ref 8.9–10.3)
Chloride: 103 mmol/L (ref 98–111)
Creatinine, Ser: 0.85 mg/dL (ref 0.44–1.00)
GFR, Estimated: >60 mL/min (ref >60)
Glucose, Bld: 96 mg/dL (ref 70–99)
Potassium: 4 mmol/L (ref 3.5–5.1)
Sodium: 136 mmol/L (ref 135–145)

## 2022-03-18 NOTE — Patient Instructions (Addendum)
It was great to see you today! No medication changes are needed at this time.   Labs today We will only contact you if something comes back abnormal or we need to make some changes. Otherwise no news is good news!  You have been referred to [facility] Address: [id number], [place], [place] [address] Phone: [phone] -they will be in contact with an appointment   Your physician recommends that you schedule a follow-up appointment in: 4 months with Dr Aundra Dubin   Do the following things EVERYDAY: Weigh yourself in the morning before breakfast. Write it down and keep it in a log. Take your medicines as prescribed Eat low salt foods--Limit salt (sodium) to 2000 mg per day.  Stay as active as you can everyday Limit all fluids for the day to less than 2 liters  At the [facility], you and your health needs are our priority. As part of our continuing mission to provide you with exceptional heart care, we have created designated Provider Care Teams. These Care Teams include your primary Cardiologist (physician) and Advanced Practice Providers (APPs- Physician Assistants and Nurse Practitioners) who all work together to provide you with the care you need, when you need it.   You may see any of the following providers on your designated Care Team at your next follow up: Dr Glori Bickers Dr Loralie Champagne Dr. Roxana Hires, NP Lyda Jester, [place] [facility] [place], [place] Forestine Na, NP Audry Riles, PharmD   Please be sure to bring in all your medications bottles to every appointment.   If you have any questions or concerns before your next appointment please send [place] a message through [place] or call our office at [phone].    TO LEAVE A MESSAGE FOR THE NURSE SELECT OPTION 2, PLEASE LEAVE A MESSAGE INCLUDING: YOUR NAME DATE OF BIRTH CALL BACK NUMBER REASON FOR CALL**this is important as we prioritize the  call backs  YOU WILL RECEIVE A CALL BACK THE SAME DAY AS LONG AS YOU CALL BEFORE 4:00 PM

## 2022-03-18 NOTE — Progress Notes (Signed)
Patient ID: Mariya Mottley, female   DOB: [birth year], 51 y.o.   MRN: [id number] PCP: Dr. Margarita Rana, [facility] Cardiology: Dr. Aundra Dubin  51 y.o. with history of HTN and nonischemic cardiomyopathy presents for followup of CHF.      She was diagnosed with CHF initially in 2011.  At that time, she was hospitalized with volume overload.  Coronary angiography in 2011 showed no significant CAD, but EF was about 25%.  BP was very high.  She was started on cardiac meds and BP improved.  Per her report, EF improved.  She eventually stopped all her medications.  She was then hospitalized in 11/16 with CHF exacerbation. EF was 30-35% on 11/16 echo and 30-35% on 2/17 echo.   Echo in 7/17 showed EF up to 40%.   She stopped her medications again.  She was then admitted in 3/22 with atrial flutter/RVR and CHF.  Unsure how long atrial flutter was present as she did not feel palpitations.  She had TEE-guided DCCV.  TEE showed EF 20-25% with severely decreased RV function, mild MR.  LHC in 3/22 showed no significant CAD.  Cardiac MRI in 6/22 showed LV EF 31%, RVEF 52%, basal-mid lateral subendocardial LGE (>50% wall thickness), septal mid-wall LGE.  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 She was seen by Dr. Broadus John, and this gene mutation was actually not thought to be the cause of DMD in her family (she likely has a mutation that was not picked up by the [employer] testing).   In 7/22, she had atrial flutter ablation.   Echo in 10/22 showed EF 20-25% with moderate LV dilation, normal RV, normal IVC.   Patient went to the ER in 06/19/21 with SVT, ?atypical flutter with 2:1 block.  She was in the ER 07/11/21 with SVT rate >200, she was cardioverted by EMS and she was in NSR in the ER.  Zio monitor in 1/23 showed 8 runs NSVT, longest 19 beats and 23 runs SVT (?atrial tachycardia), longest 16 beats.  No atrial fibrillation.   Follow up 1/23 She felt Coreg eliminated  palpitations better than Toprol XL. She continued with chronic muscle ache with activity and BP on the low side. Hydralazine and Imdur were stopped.   Follow up with Dr. Curt Bears 1/23, discussed ICD but she is hesitant due to her keloids. Eliquis stopped.   Today she returns for HF follow up with her husband. Overall feeling fine. She has dyspnea with walking up inclines but otherwise doing well. Enjoying dance for exercise and wants to get back to an exercise routine. Denies palpitations, CP, dizziness, edema, or PND/Orthopnea. Appetite ok. No fever or chills. Weight at home 272-283 pounds. Taking all medications, occasionally missed a Lasix dose during the week.  Labs (3/17): K 3.7, creatinine 0.84, SPEP negative, TSH normal.  Labs (5/17): K 3.9, creatinine 0.81, BNP normal Labs (3/22): K 5.2, creatinine 1.0 Labs (7/22): CK 1214 Labs (9/22): K 4.3, creatinine 1.04 Labs (12/22): TSH elevated at 6.6, free T4 normal, K 3.6, creatinine 1.14, BNP 161 Labs (1/23): K 4.3, creatinine 0.89 Labs (3/23): K 4.4, creatinine 1.07, BNP 87 Labs (6/23): K 4.1, creatinine 0.94  ECG (personally reviewed): none ordered today.  PMH: 1. Chronic systolic CHF: 1st noted in 2011.  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  - LHC (2011) with no significant CAD,  EF 25%.   - Echo (11/16) with EF 30-35%, diffuse hypokinesis. - Echo (2/17) with EF 30-35%, moderate LVH, diffuse hypokinesis.  - Echo (7/17) with EF 40%, severe LV dilation, grade II diastolic dysfunction.  - TEE (3/22) with EF 20-25% with severely decreased RV function, mild MR - LHC (3/22): No significant CAD.  - Cardiac MRI (6/22): LV EF 31%, RVEF 52%, basal-mid lateral subendocardial LGE (>50% wall thickness), septal mid-wall LGE.   - Echo (10/22): EF 20-25% with moderate LV dilation, normal RV, normal IVC.  2. HTN 3.  Hypothyroidism 4. Obesity.  5. Goiter 6. Atrial flutter: 3/22, s/p DCCV.  - Ablation in 7/22.  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  8. PVCs: Zio monitor (8/22) with 8% PVCs, no AF/AFL 9. SVT: noted in ER x 2 in 12/22, once possible atypical flutter and once cardioverted before reached ER.  - Zio (1/23): 8 runs NSVT, longest 19 beats and 23 runs SVT (?atrial tachycardia), longest 16 beats.  No atrial fibrillation.   SH:  Nonsmoker.  Rare ETOH.  No drugs.    FH: Mother, grandmother with HTN.  Uncle with MI. 2 sons with Duchenne muscular dystrophy (both have passed away).  Brother with Duchenne muscular dystrophy.  ROS: All systems reviewed and negative except as per HPI.   Current Outpatient Medications  Medication Sig Dispense Refill   carvedilol (COREG) 3.125 MG tablet TAKE 2 TABLETS(6.25 MG) BY MOUTH TWICE DAILY 60 tablet 3   empagliflozin (JARDIANCE) 10 MG TABS tablet Take 1 tablet (10 mg total) by mouth daily. 90 tablet 1   furosemide (LASIX) 20 MG tablet Take 1 tablet (20 mg total) by mouth daily. 90 tablet 3   Multiple Vitamin (MULTIVITAMIN WITH MINERALS) TABS tablet Take 1 tablet by mouth daily. As needed     polyvinyl alcohol (LIQUIFILM TEARS) 1.4 % ophthalmic solution Place 1 drop into both eyes 3 (three) times daily.     sacubitril-valsartan (ENTRESTO) 49-51 MG Take 1 tablet by mouth 2 (two) times daily. 180 tablet 3   spironolactone (ALDACTONE) 25 MG tablet Take 1 tablet (25 mg total) by mouth daily. 90 tablet 3   No current facility-administered medications for this encounter.   Wt Readings from Last 3 Encounters:  03/18/22 128.5 kg (283 lb 3.2 oz)  12/17/21 127.3 kg (280 lb 9.6 oz)  09/13/21 129.5 kg (285 lb 9.6 oz)   BP 98/64   Pulse 71   Ht 5' 8.5" (1.74 m)   Wt 128.5 kg (283 lb 3.2 oz)   SpO2 98%   BMI 42.43 kg/m  Physical Exam: General:  NAD. No resp difficulty HEENT: Normal Neck: Supple. No JVD. Carotids 2+ bilat; no  bruits. No lymphadenopathy or thryomegaly appreciated. Cor: PMI nondisplaced. Regular rate & rhythm. No rubs, gallops or murmurs. Lungs: Clear Abdomen: Soft, obese, nontender, nondistended. No hepatosplenomegaly. No bruits or masses. Good bowel sounds. Extremities: No cyanosis, clubbing, rash, edema Neuro: Alert & oriented x 3, cranial nerves grossly intact. Moves all 4 extremities w/o difficulty. Affect pleasant.  Assessment/Plan: 1. Chronic systolic CHF: Nonischemic cardiomyopathy. No CAD on coronary angiography in 2011 and 3/22.  She had 2 sons and a brother with Duchenne's muscular dystrophy.  She notes chronic muscle weakness with squat->stand.  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  TEE in 3/22 showed EF 20-25% with severe RV  dysfunction.  Cardiac MRI in 6/22 showed LV EF 31%, RVEF 52%, basal-mid lateral subendocardial LGE (>50% wall thickness), septal mid-wall LGE.  LGE, especially in the inferolateral wall, is seen with DMD-related cardiomyopathy. Echo in 10/22 showed EF 20-25%, moderate LV dilation, normal RV.  She is not volume overloaded on exam.  NYHA class I-II symptoms. - Continue Coreg 6.25 mg bid. - Continue Entresto 49/51 bid. BMET today. - Continue spironolactone 25 mg daily. - Continue Lasix 20 mg daily. - Continue Jardiance 10 mg daily.   - Off Imdur and hydralazine with low BP. - She does not want to have an ICD placed though I strongly recommended it given significant LGE present on cMRI.  She is very concerned about keloid formation, apparently very pruritic and bothersome when these have formed on her in the past.  We discussed this again today, she continues to decline. 2. HTN: BP controlled.  3. H/o hypothyroidism: No thyroid replacement. Previously following with endocrinology for goiter. Advised follow up. - Recent TFTs stable. 4. Obesity: We discussed  diet and exercise for weight loss again.  Body mass index is 42.43 kg/m. - her insurance will not cover semaglutide for weight loss. 5. Atrial flutter: Typical flutter in 3/22 with CHF decompensation.  Now s/p DCCV back to NSR and atrial flutter ablation. She is off Eliquis since AFL ablation. Regular on exam today. 6. DMD heterozygote: I suspect this is the cause of her mild chronic muscle weakness. CPK has been mildly elevated.  7. PVCs: Frequent on 8/22 Zio monitor, 8% total. 5.7% on 1/23 Zio monitor.  - Not high enough percentage to warrant amiodarone.  - Continue Coreg as above.  8. SVT: Noted on Zio in 1/23, ? atrial tachy.  - Continue Coreg.   Will refer her back to [place] to re-establish with PCP (Dr. Margarita Rana).  Follow up in 3-4 months with Dr. Aundra Dubin.   Maricela Bo [facility] FNP-BC 03/18/2022

## 2022-03-29 ENCOUNTER — Emergency Department (HOSPITAL_COMMUNITY)
Admission: EM | Admit: 2022-03-29 | Discharge: 2022-03-29 | Disposition: A | Discharge status: Home / Self Care | Attending: Emergency Medicine | Admitting: Emergency Medicine

## 2022-03-29 ENCOUNTER — Encounter (HOSPITAL_COMMUNITY): Payer: Self-pay | Admitting: Emergency Medicine

## 2022-03-29 ENCOUNTER — Other Ambulatory Visit: Payer: Self-pay

## 2022-03-29 ENCOUNTER — Emergency Department (HOSPITAL_COMMUNITY)

## 2022-03-29 DIAGNOSIS — R002 Palpitations: Secondary | ICD-10-CM | POA: Diagnosis present

## 2022-03-29 DIAGNOSIS — Z79899 Other long term (current) drug therapy: Secondary | ICD-10-CM | POA: Diagnosis not present

## 2022-03-29 DIAGNOSIS — I4891 Unspecified atrial fibrillation: Secondary | ICD-10-CM

## 2022-03-29 DIAGNOSIS — I110 Hypertensive heart disease with heart failure: Secondary | ICD-10-CM | POA: Insufficient documentation

## 2022-03-29 DIAGNOSIS — I471 Supraventricular tachycardia, unspecified: Secondary | ICD-10-CM

## 2022-03-29 DIAGNOSIS — E039 Hypothyroidism, unspecified: Secondary | ICD-10-CM | POA: Insufficient documentation

## 2022-03-29 DIAGNOSIS — I4892 Unspecified atrial flutter: Secondary | ICD-10-CM | POA: Diagnosis not present

## 2022-03-29 DIAGNOSIS — I509 Heart failure, unspecified: Secondary | ICD-10-CM | POA: Insufficient documentation

## 2022-03-29 DIAGNOSIS — Z7984 Long term (current) use of oral hypoglycemic drugs: Secondary | ICD-10-CM | POA: Diagnosis not present

## 2022-03-29 DIAGNOSIS — Z91040 Latex allergy status: Secondary | ICD-10-CM | POA: Diagnosis not present

## 2022-03-29 LAB — COMPREHENSIVE METABOLIC PANEL
ALT: 18 U/L (ref 0–44)
AST: 27 U/L (ref 15–41)
Albumin: 3.4 g/dL — ABNORMAL LOW (ref 3.5–5.0)
Alkaline Phosphatase: 85 U/L (ref 38–126)
Anion gap: 13 (ref 5–15)
BUN: 8 mg/dL (ref 6–20)
CO2: 22 mmol/L (ref 22–32)
Calcium: 9.1 mg/dL (ref 8.9–10.3)
Chloride: 106 mmol/L (ref 98–111)
Creatinine, Ser: 1.13 mg/dL — ABNORMAL HIGH (ref 0.44–1.00)
GFR, Estimated: 59 mL/min — ABNORMAL LOW (ref >60)
Glucose, Bld: 172 mg/dL — ABNORMAL HIGH (ref 70–99)
Potassium: 4 mmol/L (ref 3.5–5.1)
Sodium: 141 mmol/L (ref 135–145)
Total Bilirubin: 0.6 mg/dL (ref 0.3–1.2)
Total Protein: 6.8 g/dL (ref 6.5–8.1)

## 2022-03-29 LAB — URINALYSIS, ROUTINE W REFLEX MICROSCOPIC
Bacteria, UA: NONE SEEN
Bilirubin Urine: NEGATIVE
Glucose, UA: >=500 mg/dL — AB
Hgb urine dipstick: NEGATIVE
Ketones, ur: 5 mg/dL — AB
Leukocytes,Ua: NEGATIVE
Nitrite: NEGATIVE
Protein, ur: NEGATIVE mg/dL
Specific Gravity, Urine: 1.013 (ref 1.005–1.030)
pH: 5 (ref 5.0–8.0)

## 2022-03-29 LAB — I-STAT CHEM 8, ED
BUN: 10 mg/dL (ref 6–20)
Calcium, Ion: 1.06 mmol/L — ABNORMAL LOW (ref 1.15–1.40)
Chloride: 104 mmol/L (ref 98–111)
Creatinine, Ser: 0.9 mg/dL (ref 0.44–1.00)
Glucose, Bld: 170 mg/dL — ABNORMAL HIGH (ref 70–99)
HCT: 47 % — ABNORMAL HIGH (ref 36.0–46.0)
Hemoglobin: 16 g/dL — ABNORMAL HIGH (ref 12.0–15.0)
Potassium: 4.3 mmol/L (ref 3.5–5.1)
Sodium: 140 mmol/L (ref 135–145)
TCO2: 25 mmol/L (ref 22–32)

## 2022-03-29 LAB — CBC WITH DIFFERENTIAL/PLATELET
Abs Immature Granulocytes: 0.02 10*3/uL (ref 0.00–0.07)
Basophils Absolute: 0 10*3/uL (ref 0.0–0.1)
Basophils Relative: 1 %
Eosinophils Absolute: 0.3 10*3/uL (ref 0.0–0.5)
Eosinophils Relative: 4 %
HCT: 46.4 % — ABNORMAL HIGH (ref 36.0–46.0)
Hemoglobin: 15.1 g/dL — ABNORMAL HIGH (ref 12.0–15.0)
Immature Granulocytes: 0 %
Lymphocytes Relative: 62 %
Lymphs Abs: 4.7 10*3/uL — ABNORMAL HIGH (ref 0.7–4.0)
MCH: 29.7 pg (ref 26.0–34.0)
MCHC: 32.5 g/dL (ref 30.0–36.0)
MCV: 91.3 fL (ref 80.0–100.0)
Monocytes Absolute: 0.4 10*3/uL (ref 0.1–1.0)
Monocytes Relative: 5 %
Neutro Abs: 2.1 10*3/uL (ref 1.7–7.7)
Neutrophils Relative %: 28 %
Platelets: 343 10*3/uL (ref 150–400)
RBC: 5.08 MIL/uL (ref 3.87–5.11)
RDW: 13.5 % (ref 11.5–15.5)
WBC: 7.5 10*3/uL (ref 4.0–10.5)
nRBC: 0 % (ref 0.0–0.2)

## 2022-03-29 LAB — MAGNESIUM: Magnesium: 1.7 mg/dL (ref 1.7–2.4)

## 2022-03-29 MED ORDER — SODIUM CHLORIDE 0.9 % IV BOLUS
250.0000 mL | Freq: Once | INTRAVENOUS | Status: AC
  Administered 2022-03-29: 250 mL via INTRAVENOUS

## 2022-03-29 MED ORDER — AMIODARONE HCL 200 MG PO TABS: ORAL_TABLET | ORAL | 0 refills | Status: DC

## 2022-03-29 MED ORDER — APIXABAN 5 MG PO TABS: 5.0000 mg | ORAL_TABLET | Freq: Two times a day (BID) | ORAL | 0 refills | Status: DC

## 2022-03-29 MED ORDER — PROPOFOL 10 MG/ML IV BOLUS
INTRAVENOUS | Status: AC
  Filled 2022-03-29: qty 20

## 2022-03-29 MED ORDER — AMIODARONE HCL IN DEXTROSE 360-4.14 MG/200ML-% IV SOLN
60.0000 mg/h | INTRAVENOUS | Status: DC
  Administered 2022-03-29: 60 mg/h via INTRAVENOUS

## 2022-03-29 MED ORDER — AMIODARONE HCL IN DEXTROSE 360-4.14 MG/200ML-% IV SOLN
30.0000 mg/h | INTRAVENOUS | Status: DC
  Filled 2022-03-29: qty 200

## 2022-03-29 MED ORDER — AMIODARONE LOAD VIA INFUSION
150.0000 mg | Freq: Once | INTRAVENOUS | Status: AC
  Administered 2022-03-29: 150 mg via INTRAVENOUS

## 2022-03-29 MED ORDER — AMIODARONE HCL IN DEXTROSE 360-4.14 MG/200ML-% IV SOLN
INTRAVENOUS | Status: AC
  Administered 2022-03-29: 60 mg/h via INTRAVENOUS
  Filled 2022-03-29: qty 200

## 2022-03-29 MED ORDER — SODIUM CHLORIDE 0.9 % IV BOLUS
1000.0000 mL | Freq: Once | INTRAVENOUS | Status: AC
  Administered 2022-03-29: 1000 mL via INTRAVENOUS

## 2022-03-29 NOTE — ED Triage Notes (Signed)
Patient from home, she got up to go to the bathroom, she started feeling her heart racing.  She was found to be in SVT at 220.  Patient is CAOx4.  Vagal maneuvers and 6, 12 and 12 mg of Adenosine and she is still in SVT.  When the rate did slow, she was regular in nature.  Patient had this last December.   She states she was cardioverted then.  No chest pain.  She does have some nausea, but mild shortness of breath with exertion.

## 2022-03-29 NOTE — ED Notes (Signed)
Discussed patient monitor alerts w/RN.

## 2022-03-29 NOTE — Consult Note (Signed)
Cardiology Consultation   Patient ID: Suzanne Long MRN: [id number]; DOB: [birth year]  Admit date: 03/29/2022 Date of Consult: 03/29/2022  PCP:  Kathyrn Lass   [place] Providers Cardiologist:  Sinclair Grooms, MD  Electrophysiologist:  Constance Haw, MD  Advanced Heart Failure:  Loralie Champagne, MD       Patient Profile:   Suzanne Long is a 51 y.o. female with a hx of atrial flutter, CHF who is being seen 03/29/2022 for the evaluation of AF/flutter at the request of Quintella Reichert.  History of Present Illness:   Suzanne Long is a 51 year old female with a history of heart failure and atrial flutter.  She is status post ablation for typical atrial flutter.  She has since developed atypical atrial flutter.  She presented emergency room with noted palpitations.  She noticed palpitations overnight when she got up to use the restroom.  She did not have chest pain, but was short of breath.  She had a wide-complex tachycardia.  Heart rate was 224.  She was given adenosine which did not convert her to sinus rhythm.  She was started on IV amiodarone which slowed her tachycardia and revealing a 2-1 atrial flutter.  She converted to sinus rhythm, but did go into atrial fibrillation.  She has since converted back to sinus rhythm.   Past Medical History:  Diagnosis Date   CHF (congestive heart failure) (HCC)    Dyspnea    Hypertension    Hypothyroidism    Joint pain    Multiple food allergies    NICM (nonischemic cardiomyopathy) ([place])    a. Echo 2/17:  moderate LVH, EF 30-35%, diffuse HK, moderate LAE   NICM (nonischemic cardiomyopathy) ([place]) 09/26/2020   Non-ST elevation (NSTEMI) myocardial infarction ([place]) 09/26/2020    Past Surgical History:  Procedure Laterality Date   A-FLUTTER ABLATION N/A 01/22/2021   Procedure: A-FLUTTER ABLATION;  Surgeon: Constance Haw, MD;  Location: [facility];  Service: Cardiovascular;  Laterality: N/A;   BUBBLE STUDY  09/26/2020    Procedure: BUBBLE STUDY;  Surgeon: Freada Bergeron, MD;  Location: [facility];  Service: Cardiovascular;;   CARDIOVERSION N/A 09/26/2020   Procedure: CARDIOVERSION;  Surgeon: Freada Bergeron, MD;  Location: [facility];  Service: Cardiovascular;  Laterality: N/A;   LEFT HEART CATH AND CORONARY ANGIOGRAPHY N/A 09/24/2020   Procedure: LEFT HEART CATH AND CORONARY ANGIOGRAPHY;  Surgeon: Lorretta Harp, MD;  Location: [facility];  Service: Cardiovascular;  Laterality: N/A;   MYOMECTOMY     None     TEE WITHOUT CARDIOVERSION N/A 09/26/2020   Procedure: TRANSESOPHAGEAL ECHOCARDIOGRAM (TEE);  Surgeon: Freada Bergeron, MD;  Location[facility];  Service: Cardiovascular;  Laterality: N/A;     Home Medications:  Prior to Admission medications   Medication Sig Start Date End Date Taking? Authorizing Provider  carvedilol (COREG) 3.125 MG tablet TAKE 2 TABLETS(6.25 MG) BY MOUTH TWICE DAILY Patient taking differently: Take 3.125 mg by mouth 2 (two) times daily with a meal. 12/20/21  Yes Larey Dresser, MD  empagliflozin (JARDIANCE) 10 MG TABS tablet Take 1 tablet (10 mg total) by mouth daily. 12/12/21  Yes Jentry Mcqueary Hassell Done, MD  furosemide (LASIX) 20 MG tablet Take 1 tablet (20 mg total) by mouth daily. 12/17/21  Yes Larey Dresser, MD  Multiple Vitamin (MULTIVITAMIN WITH MINERALS) TABS tablet Take 1 tablet by mouth daily. As needed   Yes [provider]  polyvinyl alcohol (LIQUIFILM TEARS)  1.4 % ophthalmic solution Place 1 drop into both eyes 3 (three) times daily as needed for dry eyes.   Yes [provider]  sacubitril-valsartan (ENTRESTO) 49-51 MG Take 1 tablet by mouth 2 (two) times daily. 12/17/21  Yes Larey Dresser, MD  spironolactone (ALDACTONE) 25 MG tablet Take 1 tablet (25 mg total) by mouth daily. 12/31/20  Yes Larey Dresser, MD    Inpatient Medications: Scheduled Meds:  Continuous Infusions:  amiodarone 60 mg/hr (03/29/22 0545)   Followed  by   amiodarone     PRN Meds:   Allergies:    Allergies  Allergen Reactions   Shellfish Allergy Anaphylaxis    Uncoded Allergy. Allergen: SHELLFISH   Gadolinium Derivatives Nausea And Vomiting    Vomiting after 20cc multihance injection   Iodine Itching   Latex Rash    Social History:   Social History   Socioeconomic History   Marital status: Married    Spouse name: Danny   Number of children: 2   Years of education: 12   Highest education level: Not on file  Occupational History   Occupation: unemployed  Tobacco Use   Smoking status: Never   Smokeless tobacco: Never  Vaping Use   Vaping Use: Never used  Substance and Sexual Activity   Alcohol use: Yes    Alcohol/week: 0.0 standard drinks of alcohol    Comment: Occasional   Drug use: No   Sexual activity: Not on file  Other Topics Concern   Not on file  Social History Narrative   Patient is married husband name Danny    Patient has 2 children. Both have past away.    Patient is right handed.    Patient is unemployed    Patient has a high school education    Social Determinants of Health   Financial Resource Strain: Low Risk  (09/24/2020)   Overall Financial Resource Strain (CARDIA)    Difficulty of Paying Living Expenses: Not hard at all  Food Insecurity: No Food Insecurity (09/24/2020)   Hunger Vital Sign    Worried About Running Out of Food in the Last Year: Never true    [place] in the Last Year: Never true  Transportation Needs: No Transportation Needs (09/24/2020)   PRAPARE - Hydrologist (Medical): No    Lack of Transportation (Non-Medical): No  Physical Activity: Inactive (09/24/2020)   Exercise Vital Sign    Days of Exercise per Week: 0 days    Minutes of Exercise per Session: 0 min  Stress: Not on file  Social Connections: Not on file  Intimate Partner Violence: Not on file    Family History:    Family History  Problem Relation Age of Onset   Diabetes  Mother    Hypertension Mother    Depression Mother    Obesity Mother    Muscular dystrophy Brother    Kidney failure Brother    Muscular dystrophy Son    Migraines Neg Hx    Multiple sclerosis Neg Hx    Thyroid disease Neg Hx      ROS:  Please see the history of present illness.   All other ROS reviewed and negative.     Physical Exam/Data:   Vitals:   03/29/22 0830 03/29/22 0845 03/29/22 0900 03/29/22 0915  BP: 101/75 (!) 104/56 109/84 104/76  Pulse: 63 62 63 62  Resp: $Remo'15 20 12 10  'FVUwD$ Temp:      TempSrc:  SpO2: 100% 96% 100% 96%    Intake/Output Summary (Last 24 hours) at 03/29/2022 1004 Last data filed at 03/29/2022 0720 Gross per 24 hour  Intake 250 ml  Output --  Net 250 ml      03/18/2022    9:33 AM 12/17/2021   10:49 AM 09/13/2021   10:07 AM  Last 3 Weights  Weight (lbs) 283 lb 3.2 oz 280 lb 9.6 oz 285 lb 9.6 oz  Weight (kg) 128.459 kg 127.279 kg 129.547 kg     There is no height or weight on file to calculate BMI.  General:  Well nourished, well developed, in no acute distress HEENT: normal Neck: no JVD Vascular: No carotid bruits; Distal pulses 2+ bilaterally Cardiac:  normal S1, S2; RRR; no murmur  Lungs:  clear to auscultation bilaterally, no wheezing, rhonchi or rales  Abd: soft, nontender, no hepatomegaly  Ext: no edema Musculoskeletal:  No deformities, BUE and BLE strength normal and equal Skin: warm and dry  Neuro:  CNs 2-12 intact, no focal abnormalities noted Psych:  Normal affect   EKG:  The EKG was personally reviewed and demonstrates: Atrial fibrillation, atrial flutter, sinus rhythm Telemetry:  Telemetry was personally reviewed and demonstrates: Sinus rhythm  Relevant CV Studies: TTE 04/29/21  1. Left ventricular ejection fraction, by estimation, is 20 to 25%. The  left ventricle has severely decreased function. The left ventricle  demonstrates global hypokinesis. The left ventricular internal cavity size  was moderately dilated. Left   ventricular diastolic parameters are consistent with Grade I diastolic  dysfunction (impaired relaxation). The average left ventricular global  longitudinal strain is -13.7 %. The global longitudinal strain is  abnormal.   2. Right ventricular systolic function is normal. The right ventricular  size is normal. Tricuspid regurgitation signal is inadequate for assessing  PA pressure.   3. The mitral valve is normal in structure. Trivial mitral valve  regurgitation. No evidence of mitral stenosis.   4. The aortic valve is tricuspid. Aortic valve regurgitation is not  visualized. No aortic stenosis is present.   5. The inferior vena cava is normal in size with greater than 50%  respiratory variability, suggesting right atrial pressure of 3 mmHg.   Laboratory Data:  High Sensitivity Troponin:  No results for input(s): "TROPONINIHS" in the last 720 hours.   Chemistry Recent Labs  Lab 03/29/22 0521 03/29/22 0533  NA 141 140  K 4.0 4.3  CL 106 104  CO2 22  --   GLUCOSE 172* 170*  BUN 8 10  CREATININE 1.13* 0.90  CALCIUM 9.1  --   MG 1.7  --   GFRNONAA 59*  --   ANIONGAP 13  --     Recent Labs  Lab 03/29/22 0521  PROT 6.8  ALBUMIN 3.4*  AST 27  ALT 18  ALKPHOS 85  BILITOT 0.6   Lipids No results for input(s): "CHOL", "TRIG", "HDL", "LABVLDL", "LDLCALC", "CHOLHDL" in the last 168 hours.  Hematology Recent Labs  Lab 03/29/22 0521 03/29/22 0533  WBC 7.5  --   RBC 5.08  --   HGB 15.1* 16.0*  HCT 46.4* 47.0*  MCV 91.3  --   MCH 29.7  --   MCHC 32.5  --   RDW 13.5  --   PLT 343  --    Thyroid No results for input(s): "TSH", "FREET4" in the last 168 hours.  BNPNo results for input(s): "BNP", "PROBNP" in the last 168 hours.  DDimer No results for input(s): "  DDIMER" in the last 168 hours.   Radiology/Studies:  DG Chest Port 1 View  Result Date: 03/29/2022 CLINICAL DATA:  51 year old female with palpitations. Supraventricular tachycardia. EXAM: PORTABLE CHEST 1 VIEW  COMPARISON:  Portable chest 09/22/2020 and earlier. FINDINGS: Portable AP upright view at 0602 hours. Chronically low lung volumes. Mild-to-moderate cardiomegaly. Stable cardiac size and mediastinal contours. Visualized tracheal air column is within normal limits. Pacer pads overlie the chest. Allowing for portable technique the lungs are clear. No acute osseous abnormality identified. IMPRESSION: Low lung volumes with chronic cardiomegaly. No acute cardiopulmonary abnormality. Electronically Signed   By: Genevie Ann M.D.   On: 03/29/2022 06:22     Assessment and Plan:   Atrial fibrillation/flutter: Unable to tell if typical or atypical.  Atrial flutter was 1: 1 which improved to 2: 1 with IV amiodarone.  She then converted to atrial fibrillation and subsequently went into sinus rhythm.  As she is back in sinus rhythm, Suzanne Long switch from IV to p.o. amiodarone.  Would anticoagulate with Eliquis 5 mg twice daily.  CHA2DS2-VASc of at least 2.  We Minnah Long have her follow-up in clinic for further discussions of therapy.  She may be a candidate for repeat ablation.  Can be discharged on p.o. amiodarone 400 mg twice daily for 2 weeks followed by 200 mg daily.  Start Eliquis 5 mg twice daily.  For questions or updates, please contact [place] Please consult [email or web address] for contact info under    Signed, Brina Umeda Meredith Leeds, MD  03/29/2022 10:04 AM

## 2022-03-29 NOTE — ED Provider Notes (Signed)
10:39 AM Care assumed from Dr. Ralene Bathe while waiting for cardiology recommendations.  During the interim, patient converted to sinus rhythm and has been more stable and well-appearing.  Already saw patient and feel she is safe now for discharge home as she is back in sinus rhythm and they are going to make the medication changes and adjustments.  We will have her follow-up in clinic.  Patient will be discharged per their recommendations.  They feel patient is safe for discharge home.  They want her to start Eliquis 5 mg twice daily and amiodarone 4 mg twice daily for 2 weeks followed by 200 mg daily after.   Clinical Impression: 1. Atrial fibrillation with RVR ([place])   2. SVT (supraventricular tachycardia) (HCC)     Disposition: Discharge  Condition: Good  I have discussed the results, Dx and Tx plan with the pt(& family if present). He/she/they expressed understanding and agree(s) with the plan. Discharge instructions discussed at great length. Strict return precautions discussed and pt &/or family have verbalized understanding of the instructions. No further questions at time of discharge.    New Prescriptions   AMIODARONE (PACERONE) 200 MG TABLET    Per cardiology, please take 400 mg (2 tablets) twice a day for 2 weeks and then 200 mg (one tablet) once daily going forward.   APIXABAN (ELIQUIS) 5 MG TABS TABLET    Take 1 tablet (5 mg total) by mouth 2 (two) times daily.    Follow Up: [place] [facility] [place] [id number] [phone]          Jshon Ibe, Gwenyth Allegra, MD 03/29/22 1100

## 2022-03-29 NOTE — Discharge Instructions (Signed)
Your history and exam and work-up today revealed different arrhythmias.  The cardiology team feel you are safe for discharge home with the medication adjustment and starting the Eliquis.  Please take the Eliquis as they direct and then also start the amiodarone at 400 mg twice daily for 2 weeks followed by 200 mg a day going forward.  Please follow-up with cardiology as they recommend.  If any symptoms change or worsen acutely, please return to the nearest emergency department.

## 2022-03-29 NOTE — ED Provider Notes (Signed)
[facility] EMERGENCY DEPARTMENT Provider Note   CSN: [id number] Arrival date & time: 03/29/22  0515     History  Chief Complaint  Patient presents with   SVT    Suzanne Long is a 51 y.o. female.  The history is provided by the patient, the EMS personnel and medical records.  Suzanne Long is a 51 y.o. female who presents to the Emergency Department complaining of palpitations.  She presents to the ED by EMS for evaluation of palpitations that started about one hour prior to ED arrival.  She states that she was getting up to use the bathroom when sxs started.  No chest pain, sob.  Feels weak.  Has experienced similar episodes in the past.  Took her carvedilol yesterday, which she feels may have contributed to her sxs.  EMS gave adenosine 6, 12 and 12 without change in rhythm.       Home Medications Prior to Admission medications   Medication Sig Start Date End Date Taking? Authorizing Provider  carvedilol (COREG) 3.125 MG tablet TAKE 2 TABLETS(6.25 MG) BY MOUTH TWICE DAILY Patient taking differently: Take 3.125 mg by mouth 2 (two) times daily with a meal. 12/20/21  Yes Larey Dresser, MD  empagliflozin (JARDIANCE) 10 MG TABS tablet Take 1 tablet (10 mg total) by mouth daily. 12/12/21  Yes Camnitz, Will Hassell Done, MD  furosemide (LASIX) 20 MG tablet Take 1 tablet (20 mg total) by mouth daily. 12/17/21  Yes Larey Dresser, MD  Multiple Vitamin (MULTIVITAMIN WITH MINERALS) TABS tablet Take 1 tablet by mouth daily. As needed   Yes [provider]  polyvinyl alcohol (LIQUIFILM TEARS) 1.4 % ophthalmic solution Place 1 drop into both eyes 3 (three) times daily as needed for dry eyes.   Yes [provider]  sacubitril-valsartan (ENTRESTO) 49-51 MG Take 1 tablet by mouth 2 (two) times daily. 12/17/21  Yes Larey Dresser, MD  spironolactone (ALDACTONE) 25 MG tablet Take 1 tablet (25 mg total) by mouth daily. 12/31/20  Yes Larey Dresser, MD      Allergies     Shellfish allergy, Gadolinium derivatives, Iodine, and Latex    Review of Systems   Review of Systems  All other systems reviewed and are negative.   Physical Exam Updated Vital Signs BP 99/65   Pulse 67   Temp 98.1 F (36.7 C) (Temporal)   Resp 12   SpO2 99%  Physical Exam Vitals and nursing note reviewed.  Constitutional:      Appearance: She is well-developed.  HENT:     Head: Normocephalic and atraumatic.  Cardiovascular:     Rate and Rhythm: Regular rhythm. Tachycardia present.     Heart sounds: No murmur heard. Pulmonary:     Effort: Pulmonary effort is normal. No respiratory distress.     Breath sounds: Normal breath sounds.  Abdominal:     Palpations: Abdomen is soft.     Tenderness: There is no abdominal tenderness. There is no guarding or rebound.  Musculoskeletal:        General: No swelling or tenderness.  Skin:    General: Skin is warm and dry.  Neurological:     Mental Status: She is alert and oriented to person, place, and time.  Psychiatric:        Behavior: Behavior normal.     ED Results / Procedures / Treatments   Labs (all labs ordered are listed, but only abnormal results are displayed) Labs Reviewed  COMPREHENSIVE METABOLIC  PANEL - Abnormal; Notable for the following components:      Result Value   Glucose, Bld 172 (*)    Creatinine, Ser 1.13 (*)    Albumin 3.4 (*)    GFR, Estimated 59 (*)    All other components within normal limits  CBC WITH DIFFERENTIAL/PLATELET - Abnormal; Notable for the following components:   Hemoglobin 15.1 (*)    HCT 46.4 (*)    Lymphs Abs 4.7 (*)    All other components within normal limits  URINALYSIS, ROUTINE W REFLEX MICROSCOPIC - Abnormal; Notable for the following components:   Color, Urine STRAW (*)    Glucose, UA >=500 (*)    Ketones, ur 5 (*)    All other components within normal limits  I-STAT CHEM 8, ED - Abnormal; Notable for the following components:   Glucose, Bld 170 (*)    Calcium, Ion  1.06 (*)    Hemoglobin 16.0 (*)    HCT 47.0 (*)    All other components within normal limits  MAGNESIUM    EKG EKG Interpretation  Date/Time:  Saturday March 29 2022 07:59:15 EDT Ventricular Rate:  70 PR Interval:  153 QRS Duration: 105 QT Interval:  419 QTC Calculation: 453 R Axis:   197 Text Interpretation: Right and left arm electrode reversal, interpretation assumes no reversal Sinus rhythm Ventricular premature complex Right axis deviation Low voltage, precordial leads Abnormal lateral Q waves Anteroseptal infarct, old Confirmed by Quintella Reichert [phone]) on 03/29/2022 8:07:53 AM  Radiology DG Chest Port 1 View  Result Date: 03/29/2022 CLINICAL DATA:  51 year old female with palpitations. Supraventricular tachycardia. EXAM: PORTABLE CHEST 1 VIEW COMPARISON:  Portable chest 09/22/2020 and earlier. FINDINGS: Portable AP upright view at 0602 hours. Chronically low lung volumes. Mild-to-moderate cardiomegaly. Stable cardiac size and mediastinal contours. Visualized tracheal air column is within normal limits. Pacer pads overlie the chest. Allowing for portable technique the lungs are clear. No acute osseous abnormality identified. IMPRESSION: Low lung volumes with chronic cardiomegaly. No acute cardiopulmonary abnormality. Electronically Signed   By: Genevie Ann M.D.   On: 03/29/2022 06:22    Procedures Procedures   CRITICAL CARE Performed by: Quintella Reichert   Total critical care time: 40 minutes  Critical care time was exclusive of separately billable procedures and treating other patients.  Critical care was necessary to treat or prevent imminent or life-threatening deterioration.  Critical care was time spent personally by me on the following activities: development of treatment plan with patient and/or surrogate as well as nursing, discussions with consultants, evaluation of patient's response to treatment, examination of patient, obtaining history from patient or surrogate,  ordering and performing treatments and interventions, ordering and review of laboratory studies, ordering and review of radiographic studies, pulse oximetry and re-evaluation of patient's condition.  Medications Ordered in ED Medications  amiodarone (NEXTERONE) 1.8 mg/mL load via infusion 150 mg (150 mg Intravenous Bolus from Bag 03/29/22 0543)    Followed by  amiodarone (NEXTERONE PREMIX) 360-4.14 MG/200ML-% (1.8 mg/mL) IV infusion (60 mg/hr Intravenous New Bag/Given 03/29/22 0545)    Followed by  amiodarone (NEXTERONE PREMIX) 360-4.14 MG/200ML-% (1.8 mg/mL) IV infusion (has no administration in time range)  sodium chloride 0.9 % bolus 1,000 mL (0 mLs Intravenous Stopped 03/29/22 0556)  sodium chloride 0.9 % bolus 250 mL (0 mLs Intravenous Stopped 03/29/22 0720)    ED Course/ Medical Decision Making/ A&P  Medical Decision Making Amount and/or Complexity of Data Reviewed Labs: ordered. Radiology: ordered.  Risk Prescription drug management.   Patient with history of nonischemic cardiomyopathy here for evaluation of palpitations that started 1 hour prior to arrival.  She received multiple doses of adenosine prior to ED arrival without conversion of rhythm.  EKG with wide-complex tachycardia, patient well perfused on evaluation.  She was treated with amiodarone bolus with rapid change in her rhythm to sinus tachycardia and resolution of her symptoms.  Later during her ED stay she developed A-fib with RVR but was well perfused and asymptomatic, heart rates in the 130s.  She then developed sinus rhythm after another hour on amnioinfusion.  Cardiology consulted for further recommendations regarding treatment options.        Final Clinical Impression(s) / ED Diagnoses Final diagnoses:  None    Rx / DC Orders ED Discharge Orders     None         Quintella Reichert, MD 03/29/22 [phone]

## 2022-04-14 ENCOUNTER — Ambulatory Visit (INDEPENDENT_AMBULATORY_CARE_PROVIDER_SITE_OTHER): Admitting: Primary Care

## 2022-04-14 ENCOUNTER — Encounter (INDEPENDENT_AMBULATORY_CARE_PROVIDER_SITE_OTHER): Payer: Self-pay | Admitting: Primary Care

## 2022-04-14 ENCOUNTER — Ambulatory Visit: Attending: Internal Medicine

## 2022-04-14 VITALS — BP 110/76 | HR 49 | Resp 16 | Ht 68.5 in | Wt 278.2 lb

## 2022-04-14 DIAGNOSIS — E049 Nontoxic goiter, unspecified: Secondary | ICD-10-CM

## 2022-04-14 DIAGNOSIS — E559 Vitamin D deficiency, unspecified: Secondary | ICD-10-CM

## 2022-04-14 DIAGNOSIS — Z131 Encounter for screening for diabetes mellitus: Secondary | ICD-10-CM

## 2022-04-14 DIAGNOSIS — Z0000 Encounter for general adult medical examination without abnormal findings: Secondary | ICD-10-CM

## 2022-04-14 DIAGNOSIS — Z7689 Persons encountering health services in other specified circumstances: Secondary | ICD-10-CM

## 2022-04-14 DIAGNOSIS — Z6841 Body Mass Index (BMI) 40.0 and over, adult: Secondary | ICD-10-CM

## 2022-04-14 NOTE — Addendum Note (Signed)
Addended by: Juluis Mire on: 04/14/2022 03:01 PM   Modules accepted: Orders

## 2022-04-14 NOTE — Progress Notes (Deleted)
[name], is a 51 y.o. female  WVP:[id number]  NIO:[id number]  DOB - Oct 27, 1970  No chief complaint on file.      Subjective:   Suzanne Long is a 51 y.o. female here today for a right eye broken blood vessel.Normally not a problem   No vision changes and only in the white part of eye . Causative factors HTN, sneeze, cough,  strain rubbing or the use of contacts. .  Patient has No headache, No chest pain, No abdominal pain - No Nausea, No new weakness tingling or numbness, No Cough - shortness of breath  No problems updated.  Allergies  Allergen Reactions   Shellfish Allergy Anaphylaxis    Uncoded Allergy. Allergen: SHELLFISH   Gadolinium Derivatives Nausea And Vomiting    Vomiting after 20cc multihance injection   Iodine Itching   Latex Rash    Past Medical History:  Diagnosis Date   CHF (congestive heart failure) (HCC)    Dyspnea    Hypertension    Hypothyroidism    Joint pain    Multiple food allergies    NICM (nonischemic cardiomyopathy) ([facility])    a. Echo 2/17:  moderate LVH, EF 30-35%, diffuse HK, moderate LAE   NICM (nonischemic cardiomyopathy) ([facility]) 09/26/2020   Non-ST elevation (NSTEMI) myocardial infarction ([facility]) 09/26/2020    Current Outpatient Medications on File Prior to Visit  Medication Sig Dispense Refill   amiodarone (PACERONE) 200 MG tablet Per cardiology, please take 400 mg (2 tablets) twice a day for 2 weeks and then 200 mg (one tablet) once daily going forward. 126 tablet 0   apixaban (ELIQUIS) 5 MG TABS tablet Take 1 tablet (5 mg total) by mouth 2 (two) times daily. 60 tablet 0   carvedilol (COREG) 3.125 MG tablet TAKE 2 TABLETS(6.25 MG) BY MOUTH TWICE DAILY (Patient taking differently: Take 3.125 mg by mouth 2 (two) times daily with a meal.) 60 tablet 3   empagliflozin (JARDIANCE) 10 MG TABS tablet Take 1 tablet (10 mg total) by mouth daily. 90 tablet 1   furosemide (LASIX) 20 MG tablet Take 1 tablet (20 mg total) by  mouth daily. 90 tablet 3   Multiple Vitamin (MULTIVITAMIN WITH MINERALS) TABS tablet Take 1 tablet by mouth daily. As needed     polyvinyl alcohol (LIQUIFILM TEARS) 1.4 % ophthalmic solution Place 1 drop into both eyes 3 (three) times daily as needed for dry eyes.     sacubitril-valsartan (ENTRESTO) 49-51 MG Take 1 tablet by mouth 2 (two) times daily. 180 tablet 3   spironolactone (ALDACTONE) 25 MG tablet Take 1 tablet (25 mg total) by mouth daily. 90 tablet 3   No current facility-administered medications on file prior to visit.    Objective:   Exam General appearance : Awake, alert, not in any distress. Speech Clear. Not toxic looking HEENT: Atraumatic and Normocephalic, Right eye broken blood vessel, pupils equally reactive to light and accomodation Neck: Supple, no JVD. No cervical lymphadenopathy.  Chest: Good air entry bilaterally, no added sounds  CVS: S1 S2 regular, no murmurs.  Abdomen: Bowel sounds present, Non tender and not distended with no gaurding, rigidity or rebound. Extremities: B/L Lower Ext shows no edema, both legs are warm to touch Neurology: Awake alert, and oriented X 3, CN II-XII intact, Non focal Skin: No Rash  Data Review Lab Results  Component Value Date   HGBA1C 5.4 11/06/2020   HGBA1C 5.4 05/31/2019   HGBA1C 5.3 03/04/2019    Assessment &  Plan   There are no diagnoses linked to this encounter.   Patient have been counseled extensively about nutrition and exercise. Other issues discussed during this visit include: low cholesterol diet, weight control and daily exercise, foot care, annual eye examinations at Ophthalmology, importance of adherence with medications and regular follow-up. We also discussed long term complications of uncontrolled diabetes and hypertension.   No follow-ups on file.  The patient was given clear instructions to go to ER or return to medical center if symptoms don't improve, worsen or new problems develop. The patient  verbalized understanding. The patient was told to call to get lab results if they haven't heard anything in the next week.   This note has been created with [employer], quantity. Any transcriptional errors are unintentional.   Kerin Perna, NP 04/14/2022, 1:47 PM

## 2022-04-14 NOTE — Patient Instructions (Signed)
Obesity, Adult ?Obesity is having too much body fat. Being obese means that your weight is more than what is healthy for you.  ?BMI (body mass index) is a number that explains how much body fat you have. If you have a BMI of 30 or more, you are obese. ?Obesity can cause serious health problems, such as: ?Stroke. ?Coronary artery disease (CAD). ?Type 2 diabetes. ?Some types of cancer. ?High blood pressure (hypertension). ?High cholesterol. ?Gallbladder stones. ?Obesity can also contribute to: ?Osteoarthritis. ?Sleep apnea. ?Infertility problems. ?What are the causes? ?Eating meals each day that are high in calories, sugar, and fat. ?Drinking a lot of drinks that have sugar in them. ?Being born with genes that may make you more likely to become obese. ?Having a medical condition that causes obesity. ?Taking certain medicines. ?Sitting a lot (having a sedentary lifestyle). ?Not getting enough sleep. ?What increases the risk? ?Having a family history of obesity. ?Living in an area with limited access to: ?Parks, recreation centers, or sidewalks. ?Healthy food choices, such as grocery stores and farmers' markets. ?What are the signs or symptoms? ?The main sign is having too much body fat. ?How is this treated? ?Treatment for this condition often includes changing your lifestyle. Treatment may include: ?Changing your diet. This may include making a healthy meal plan. ?Exercise. This may include activity that causes your heart to beat faster (aerobic exercise) and strength training. Work with your doctor to design a program that works for you. ?Medicine to help you lose weight. This may be used if you are not able to lose one pound a week after 6 weeks of healthy eating and more exercise. ?Treating conditions that cause the obesity. ?Surgery. Options may include gastric banding and gastric bypass. This may be done if: ?Other treatments have not helped to improve your condition. ?You have a BMI of 40 or higher. ?You have  life-threatening health problems related to obesity. ?Follow these instructions at home: ?Eating and drinking ? ?Follow advice from your doctor about what to eat and drink. Your doctor may tell you to: ?Limit fast food, sweets, and processed snack foods. ?Choose low-fat options. For example, choose low-fat milk instead of whole milk. ?Eat five or more servings of fruits or vegetables each day. ?Eat at home more often. This gives you more control over what you eat. ?Choose healthy foods when you eat out. ?Learn to read food labels. This will help you learn how much food is in one serving. ?Keep low-fat snacks available. ?Avoid drinks that have a lot of sugar in them. These include soda, fruit juice, iced tea with sugar, and flavored milk. ?Drink enough water to keep your pee (urine) pale yellow. ?Do not go on fad diets. ?Physical activity ?Exercise often, as told by your doctor. Most adults should get up to 150 minutes of moderate-intensity exercise every week.Ask your doctor: ?What types of exercise are safe for you. ?How often you should exercise. ?Warm up and stretch before being active. ?Do slow stretching after being active (cool down). ?Rest between times of being active. ?Lifestyle ?Work with your doctor and a food expert (dietitian) to set a weight-loss goal that is best for you. ?Limit your screen time. ?Find ways to reward yourself that do not involve food. ?Do not drink alcohol if: ?Your doctor tells you not to drink. ?You are pregnant, may be pregnant, or are planning to become pregnant. ?If you drink alcohol: ?Limit how much you have to: ?0-1 drink a day for women. ?0-2 drinks   a day for men. ?Know how much alcohol is in your drink. In the [place] one drink equals one 12 oz bottle of beer (355 mL), one 5 oz glass of wine (148 mL), or one 1? oz glass of hard liquor (44 mL). ?General instructions ?Keep a weight-loss journal. This can help you keep track of: ?The food that you eat. ?How much exercise you  get. ?Take over-the-counter and prescription medicines only as told by your doctor. ?Take vitamins and supplements only as told by your doctor. ?Think about joining a support group. ?Pay attention to your mental health as obesity can lead to depression or self esteem issues. ?Keep all follow-up visits. ?Contact a doctor if: ?You cannot meet your weight-loss goal after you have changed your diet and lifestyle for 6 weeks. ?You are having trouble breathing. ?Summary ?Obesity is having too much body fat. ?Being obese means that your weight is more than what is healthy for you. ?Work with your doctor to set a weight-loss goal. ?Get regular exercise as told by your doctor. ?This information is not intended to replace advice given to you by your health care provider. Make sure you discuss any questions you have with your health care provider. ?Document Revised: 02/05/2021 Document Reviewed: 02/05/2021 ?Elsevier Patient Education ? 2023 [employer] ? ?

## 2022-04-14 NOTE — Progress Notes (Signed)
New Patient Office Visit  Subjective    Patient ID: Suzanne Long, female    DOB: April 20, 1971  Age: 51 y.o. MRN: [id number]  CC:  Chief Complaint  Patient presents with   New Patient (Initial Visit)    HPI Suzanne Long  is a 50 y.o. morbid femalepresents to establish care. Her husband Suzanne Long is present and she gave permission for him to be at her visit. Initially had a  left eye broken blood vessel. Normally not a problem   No vision changes and only in the white part of eye . Causative factors HTN, sneeze, cough,  strain rubbing or the use of contacts.  Patient has No headache, No chest pain, No abdominal pain - No Nausea, No new weakness tingling or numbness, No Cough - shortness of breath. Dr. Shirlee Latch, Eliot Ford, MD manages HTN and Chronic systolic CHF.  Outpatient Encounter Medications as of 04/14/2022  Medication Sig   amiodarone (PACERONE) 200 MG tablet Per cardiology, please take 400 mg (2 tablets) twice a day for 2 weeks and then 200 mg (one tablet) once daily going forward.   apixaban (ELIQUIS) 5 MG TABS tablet Take 1 tablet (5 mg total) by mouth 2 (two) times daily.   carvedilol (COREG) 3.125 MG tablet TAKE 2 TABLETS(6.25 MG) BY MOUTH TWICE DAILY (Patient taking differently: Take 3.125 mg by mouth 2 (two) times daily with a meal.)   empagliflozin (JARDIANCE) 10 MG TABS tablet Take 1 tablet (10 mg total) by mouth daily.   furosemide (LASIX) 20 MG tablet Take 1 tablet (20 mg total) by mouth daily.   Multiple Vitamin (MULTIVITAMIN WITH MINERALS) TABS tablet Take 1 tablet by mouth daily. As needed   sacubitril-valsartan (ENTRESTO) 49-51 MG Take 1 tablet by mouth 2 (two) times daily.   spironolactone (ALDACTONE) 25 MG tablet Take 1 tablet (25 mg total) by mouth daily.   polyvinyl alcohol (LIQUIFILM TEARS) 1.4 % ophthalmic solution Place 1 drop into both eyes 3 (three) times daily as needed for dry eyes. (Patient not taking: Reported on 04/14/2022)   No facility-administered  encounter medications on file as of 04/14/2022.    Past Medical History:  Diagnosis Date   CHF (congestive heart failure) (HCC)    Dyspnea    Hypertension    Hypothyroidism    Joint pain    Multiple food allergies    NICM (nonischemic cardiomyopathy) (HCC)    a. Echo 2/17:  moderate LVH, EF 30-35%, diffuse HK, moderate LAE   NICM (nonischemic cardiomyopathy) (HCC) 09/26/2020   Non-ST elevation (NSTEMI) myocardial infarction (HCC) 09/26/2020    Past Surgical History:  Procedure Laterality Date   A-FLUTTER ABLATION N/A 01/22/2021   Procedure: A-FLUTTER ABLATION;  Surgeon: Regan Lemming, MD;  Location: [facility];  Service: Cardiovascular;  Laterality: N/A;   BUBBLE STUDY  09/26/2020   Procedure: BUBBLE STUDY;  Surgeon: Meriam Sprague, MD;  Location: [facility] ENDOSCOPY;  Service: Cardiovascular;;   CARDIOVERSION N/A 09/26/2020   Procedure: CARDIOVERSION;  Surgeon: Meriam Sprague, MD;  Location: [facility] ENDOSCOPY;  Service: Cardiovascular;  Laterality: N/A;   LEFT HEART CATH AND CORONARY ANGIOGRAPHY N/A 09/24/2020   Procedure: LEFT HEART CATH AND CORONARY ANGIOGRAPHY;  Surgeon: Runell Gess, MD;  Location: [facility] CV [facility];  Service: Cardiovascular;  Laterality: N/A;   MYOMECTOMY     None     TEE WITHOUT CARDIOVERSION N/A 09/26/2020   Procedure: TRANSESOPHAGEAL ECHOCARDIOGRAM (TEE);  Surgeon: Meriam Sprague, MD;  Location: [facility]  ENDOSCOPY;  Service: Cardiovascular;  Laterality: N/A;    Family History  Problem Relation Age of Onset   Diabetes Mother    Hypertension Mother    Depression Mother    Obesity Mother    Muscular dystrophy Brother    Kidney failure Brother    Muscular dystrophy Son    Migraines Neg Hx    Multiple sclerosis Neg Hx    Thyroid disease Neg Hx     Social History   Socioeconomic History   Marital status: Married    Spouse name: Danny   Number of children: 2   Years of education: 12   Highest education level: Not on file   Occupational History   Occupation: unemployed  Tobacco Use   Smoking status: Never   Smokeless tobacco: Never  Vaping Use   Vaping Use: Never used  Substance and Sexual Activity   Alcohol use: Yes    Alcohol/week: 0.0 standard drinks of alcohol    Comment: Occasional   Drug use: No   Sexual activity: Not on file  Other Topics Concern   Not on file  Social History Narrative   Patient is married husband name Danny    Patient has 2 children. Both have past away.    Patient is right handed.    Patient is unemployed    Patient has a high school education    Social Determinants of Health   Financial Resource Strain: Low Risk  (09/24/2020)   Overall Financial Resource Strain (CARDIA)    Difficulty of Paying Living Expenses: Not hard at all  Food Insecurity: No Food Insecurity (09/24/2020)   Hunger Vital Sign    Worried About Running Out of Food in the Last Year: Never true    [place] in the Last Year: Never true  Transportation Needs: No Transportation Needs (09/24/2020)   PRAPARE - Hydrologist (Medical): No    Lack of Transportation (Non-Medical): No  Physical Activity: Inactive (09/24/2020)   Exercise Vital Sign    Days of Exercise per Week: 0 days    Minutes of Exercise per Session: 0 min  Stress: Not on file  Social Connections: Not on file  Intimate Partner Violence: Not on file    ROS Comprehensive ROS Pertinent positive and negative noted in HPI    Objective    BP 110/76   Pulse (!) 49   Resp 16   Ht 5' 8.5" (1.74 m)   Wt 278 lb 3.2 oz (126.2 kg)   SpO2 98%   BMI 41.68 kg/m   Physical exam: General: Vital signs reviewed.  Patient is well-developed and well-nourished,morbid obese female in no acute distress and cooperative with exam. Head: Normocephalic and atraumatic.Right ear cerumen impaction Eyes: EOMI, conjunctivae normal, no scleral icterus. Neck: Supple, trachea midline, normal ROM, no JVD,  thyromegaly present ,  or carotid bruit present. Cardiovascular: RRR, S1 normal, S2 normal, no murmurs, gallops, or rubs. Pulmonary/Chest: Clear to auscultation bilaterally, no wheezes, rales, or rhonchi. Abdominal: Soft, non-tender, non-distended, BS +, no masses, organomegaly, or guarding present. Musculoskeletal: No joint deformities, erythema, or stiffness, ROM full and nontender. Extremities: No lower extremity edema bilaterally,  pulses symmetric and intact bilaterally. No cyanosis or clubbing. Neurological: A&O x3, Strength is normal Skin: Warm, dry and intact. No rashes or erythema. Psychiatric: Normal mood and affect. speech and behavior is normal. Cognition and memory are normal.  Assessment & Plan:   Margene was seen today for new  patient (initial visit).  Diagnoses and all orders for this visit:  Encounter to establish care Establish care with new provider   Goiter -     TSH + free T4 -     Iodine, Serum/Plasma -     Calcitonin  Screening for diabetes mellitus -     Hemoglobin A1c  Health care maintenance -     HCV Ab w Reflex to Quant PCR  Vitamin D deficiency Vitamin D is needed to make and keep bones strong. The patient will need to take a prescription strength vitamin D tablet once weekly until next appointment.  Vitamin D level will be rechecked at future visit.  I have sent the vitamin D tablet to the pharmacy and it should be ready for pick up.  Please remind patient of upcoming appointments and/or schedule for follow-up if needed in 6-8 weeks.   Morbid obesity ([place]) Morbid Obesity is > 40  indicating an excess in caloric intake or underlining conditions. This has lead to co-morbidities. Lifestyle modifications of diet and exercise may reduce obesity.     Kerin Perna, NP

## 2022-04-15 LAB — T3, FREE: T3, Free: 2.1 pg/mL (ref 2.0–4.4)

## 2022-04-21 ENCOUNTER — Other Ambulatory Visit (INDEPENDENT_AMBULATORY_CARE_PROVIDER_SITE_OTHER): Payer: Self-pay | Admitting: Primary Care

## 2022-04-21 DIAGNOSIS — E049 Nontoxic goiter, unspecified: Secondary | ICD-10-CM

## 2022-04-21 LAB — HEMOGLOBIN A1C
Est. average glucose Bld gHb Est-mCnc: 114 mg/dL
Hgb A1c MFr Bld: 5.6 % (ref 4.8–5.6)

## 2022-04-21 LAB — TSH+FREE T4
Free T4: 1.38 ng/dL (ref 0.82–1.77)
TSH: 3.11 u[IU]/mL (ref 0.450–4.500)

## 2022-04-21 LAB — HCV INTERPRETATION

## 2022-04-21 LAB — IODINE, SERUM/PLASMA: Iodine: 942.8 ug/L — ABNORMAL HIGH (ref 40.0–92.0)

## 2022-04-21 LAB — VITAMIN D 25 HYDROXY (VIT D DEFICIENCY, FRACTURES): Vit D, 25-Hydroxy: 19.8 ng/mL — ABNORMAL LOW (ref 30.0–100.0)

## 2022-04-21 LAB — HCV AB W REFLEX TO QUANT PCR: HCV Ab: NONREACTIVE

## 2022-04-21 LAB — CALCITONIN: Calcitonin: <2 pg/mL (ref 0.0–5.0)

## 2022-04-22 ENCOUNTER — Other Ambulatory Visit (INDEPENDENT_AMBULATORY_CARE_PROVIDER_SITE_OTHER): Payer: Self-pay | Admitting: Primary Care

## 2022-04-22 ENCOUNTER — Ambulatory Visit (INDEPENDENT_AMBULATORY_CARE_PROVIDER_SITE_OTHER): Admitting: Primary Care

## 2022-04-22 DIAGNOSIS — E049 Nontoxic goiter, unspecified: Secondary | ICD-10-CM

## 2022-04-22 MED ORDER — VITAMIN D (ERGOCALCIFEROL) 1.25 MG (50000 UNIT) PO CAPS: 50000.0000 [IU] | ORAL_CAPSULE | ORAL | 0 refills | Status: DC

## 2022-04-23 ENCOUNTER — Ambulatory Visit (HOSPITAL_COMMUNITY): Admitting: Physician Assistant

## 2022-04-28 ENCOUNTER — Encounter (HOSPITAL_COMMUNITY): Payer: Self-pay | Admitting: Physician Assistant

## 2022-04-28 ENCOUNTER — Ambulatory Visit (HOSPITAL_COMMUNITY)
Admission: RE | Admit: 2022-04-28 | Discharge: 2022-04-28 | Disposition: A | Discharge status: Home / Self Care | Source: Ambulatory Visit | Attending: Physician Assistant | Admitting: Physician Assistant

## 2022-04-28 VITALS — BP 114/78 | HR 61 | Ht 68.5 in | Wt 275.2 lb

## 2022-04-28 DIAGNOSIS — Z7901 Long term (current) use of anticoagulants: Secondary | ICD-10-CM | POA: Insufficient documentation

## 2022-04-28 DIAGNOSIS — I484 Atypical atrial flutter: Secondary | ICD-10-CM | POA: Diagnosis not present

## 2022-04-28 DIAGNOSIS — I4892 Unspecified atrial flutter: Secondary | ICD-10-CM | POA: Insufficient documentation

## 2022-04-28 DIAGNOSIS — E669 Obesity, unspecified: Secondary | ICD-10-CM | POA: Diagnosis not present

## 2022-04-28 DIAGNOSIS — G7100 Muscular dystrophy, unspecified: Secondary | ICD-10-CM | POA: Insufficient documentation

## 2022-04-28 DIAGNOSIS — E039 Hypothyroidism, unspecified: Secondary | ICD-10-CM | POA: Insufficient documentation

## 2022-04-28 DIAGNOSIS — D6869 Other thrombophilia: Secondary | ICD-10-CM | POA: Diagnosis not present

## 2022-04-28 DIAGNOSIS — Z6841 Body Mass Index (BMI) 40.0 and over, adult: Secondary | ICD-10-CM | POA: Diagnosis not present

## 2022-04-28 DIAGNOSIS — I110 Hypertensive heart disease with heart failure: Secondary | ICD-10-CM | POA: Insufficient documentation

## 2022-04-28 DIAGNOSIS — I480 Paroxysmal atrial fibrillation: Secondary | ICD-10-CM | POA: Diagnosis present

## 2022-04-28 DIAGNOSIS — I5022 Chronic systolic (congestive) heart failure: Secondary | ICD-10-CM | POA: Insufficient documentation

## 2022-04-28 MED ORDER — APIXABAN 5 MG PO TABS: 5.0000 mg | ORAL_TABLET | Freq: Two times a day (BID) | ORAL | 3 refills | Status: DC

## 2022-04-28 MED ORDER — AMIODARONE HCL 200 MG PO TABS: 200.0000 mg | ORAL_TABLET | Freq: Every day | ORAL | 3 refills | Status: DC

## 2022-04-28 NOTE — Progress Notes (Signed)
Primary Care Physician: Pcp, No Primary Cardiologist: Dr Katrinka Blazing Primary Electrophysiologist: Dr Elberta Fortis  [facility]: Dr Shirlee Latch Referring Physician: Dr Alphia Kava is a 51 y.o. female with a history of chronic systolic CHF, HTN, hypothyroidism, Duchenne muscular dystrophy, atrial flutter, atrial fibrillation who presents for follow up in the [facility]. Patient is s/p typical flutter ablation 01/22/21 but has since developed atypical atrial flutter and atrial fibrillation. Patient is on Eliquis for a CHADS2VASC score of 3. She was seen at the ED 03/29/22 with palpitations and SOB. She had a wide-complex tachycardia, heart rate 224 bpm.  She was given adenosine which did not convert her to sinus rhythm.  She was started on IV amiodarone which slowed her tachycardia and revealing a 2-1 atrial flutter.  She converted to SR, then afib, then SR again. She was discharged on amiodarone load.  On follow up today, patient reports that she has done well since her hospitalization. She remains in SR. No issues on the medication.   Today, she denies symptoms of palpitations, chest pain, shortness of breath, orthopnea, PND, lower extremity edema, dizziness, presyncope, syncope, snoring, daytime somnolence, bleeding, or neurologic sequela. The patient is tolerating medications without difficulties and is otherwise without complaint today.    Atrial Fibrillation Risk Factors:  she does not have symptoms or diagnosis of sleep apnea. she does not have a history of rheumatic fever.   she has a BMI of Body mass index is 41.24 kg/m.Marland Kitchen Filed Weights   04/28/22 1425  Weight: 124.8 kg    Family History  Problem Relation Age of Onset   Diabetes Mother    Hypertension Mother    Depression Mother    Obesity Mother    Muscular dystrophy Brother    Kidney failure Brother    Muscular dystrophy Son    Migraines Neg Hx    Multiple sclerosis Neg Hx    Thyroid disease Neg Hx       Atrial Fibrillation Management history:  Previous antiarrhythmic drugs: amiodarone  Previous cardioversions: 09/26/20 Previous ablations: flutter 01/22/21 CHADS2VASC score: 3 Anticoagulation history: Eliquis   Past Medical History:  Diagnosis Date   CHF (congestive heart failure) (HCC)    Dyspnea    Hypertension    Hypothyroidism    Joint pain    Multiple food allergies    NICM (nonischemic cardiomyopathy) (HCC)    a. Echo 2/17:  moderate LVH, EF 30-35%, diffuse HK, moderate LAE   NICM (nonischemic cardiomyopathy) (HCC) 09/26/2020   Non-ST elevation (NSTEMI) myocardial infarction (HCC) 09/26/2020   Past Surgical History:  Procedure Laterality Date   A-FLUTTER ABLATION N/A 01/22/2021   Procedure: A-FLUTTER ABLATION;  Surgeon: Regan Lemming, MD;  Location: [facility];  Service: Cardiovascular;  Laterality: N/A;   BUBBLE STUDY  09/26/2020   Procedure: BUBBLE STUDY;  Surgeon: Meriam Sprague, MD;  Location: [facility] ENDOSCOPY;  Service: Cardiovascular;;   CARDIOVERSION N/A 09/26/2020   Procedure: CARDIOVERSION;  Surgeon: Meriam Sprague, MD;  Location: [facility] ENDOSCOPY;  Service: Cardiovascular;  Laterality: N/A;   LEFT HEART CATH AND CORONARY ANGIOGRAPHY N/A 09/24/2020   Procedure: LEFT HEART CATH AND CORONARY ANGIOGRAPHY;  Surgeon: Runell Gess, MD;  Location: [facility]VAS[facility] CV LAB;  Service: Cardiovascular;  Laterality: N/A;   MYOMECTOMY     None     TEE WITHOUT CARDIOVERSION N/A 09/26/2020   Procedure: TRANSESOPHAGEAL ECHOCARDIOGRAM (TEE);  Surgeon: Meriam Sprague, MD;  Location: [facility] ENDOSCOPY;  Service: Cardiovascular;  Laterality: N/A;    Current Outpatient Medications  Medication Sig Dispense Refill   amiodarone (PACERONE) 200 MG tablet Per cardiology, please take 400 mg (2 tablets) twice a day for 2 weeks and then 200 mg (one tablet) once daily going forward. 126 tablet 0   apixaban (ELIQUIS) 5 MG TABS tablet Take 1 tablet (5 mg total) by mouth 2  (two) times daily. 60 tablet 0   carvedilol (COREG) 3.125 MG tablet TAKE 2 TABLETS(6.25 MG) BY MOUTH TWICE DAILY 60 tablet 3   empagliflozin (JARDIANCE) 10 MG TABS tablet Take 1 tablet (10 mg total) by mouth daily. 90 tablet 1   furosemide (LASIX) 20 MG tablet Take 1 tablet (20 mg total) by mouth daily. 90 tablet 3   Multiple Vitamin (MULTIVITAMIN WITH MINERALS) TABS tablet Take 1 tablet by mouth daily. As needed     polyvinyl alcohol (LIQUIFILM TEARS) 1.4 % ophthalmic solution Place 1 drop into both eyes 3 (three) times daily as needed for dry eyes.     sacubitril-valsartan (ENTRESTO) 49-51 MG Take 1 tablet by mouth 2 (two) times daily. 180 tablet 3   spironolactone (ALDACTONE) 25 MG tablet Take 1 tablet (25 mg total) by mouth daily. 90 tablet 3   Vitamin D, Ergocalciferol, (DRISDOL) 1.25 MG (50000 UNIT) CAPS capsule Take 1 capsule (50,000 Units total) by mouth every 7 (seven) days. 8 capsule 0   No current facility-administered medications for this encounter.    Allergies  Allergen Reactions   Shellfish Allergy Anaphylaxis    Uncoded Allergy. Allergen: SHELLFISH   Gadolinium Derivatives Nausea And Vomiting    Vomiting after 20cc multihance injection   Iodine Itching   Latex Rash    Social History   Socioeconomic History   Marital status: Married    Spouse name: Danny   Number of children: 2   Years of education: 12   Highest education level: Not on file  Occupational History   Occupation: unemployed  Tobacco Use   Smoking status: Never   Smokeless tobacco: Never   Tobacco comments:    Never smoke 04/28/22  Vaping Use   Vaping Use: Never used  Substance and Sexual Activity   Alcohol use: Yes    Alcohol/week: 2.0 - 3.0 standard drinks of alcohol    Types: 2 - 3 Standard drinks or equivalent per week    Comment: 2-3 a year 04/28/22   Drug use: Never   Sexual activity: Not on file  Other Topics Concern   Not on file  Social History Narrative   Patient is married  husband name Danny    Patient has 2 children. Both have past away.    Patient is right handed.    Patient is unemployed    Patient has a high school education    Social Determinants of Health   Financial Resource Strain: Low Risk  (09/24/2020)   Overall Financial Resource Strain (CARDIA)    Difficulty of Paying Living Expenses: Not hard at all  Food Insecurity: No Food Insecurity (09/24/2020)   Hunger Vital Sign    Worried About Running Out of Food in the Last Year: Never true    [place] in the Last Year: Never true  Transportation Needs: No Transportation Needs (09/24/2020)   PRAPARE - Hydrologist (Medical): No    Lack of Transportation (Non-Medical): No  Physical Activity: Inactive (09/24/2020)   Exercise Vital Sign    Days of Exercise per  Week: 0 days    Minutes of Exercise per Session: 0 min  Stress: Not on file  Social Connections: Not on file  Intimate Partner Violence: Not on file     ROS- All systems are reviewed and negative except as per the HPI above.  Physical Exam: Vitals:   04/28/22 1425  BP: 114/78  Pulse: 61  Weight: 124.8 kg  Height: 5' 8.5" (1.74 m)    GEN- The patient is a well appearing obese female, alert and oriented x 3 today.   Head- normocephalic, atraumatic Eyes-  Sclera clear, conjunctiva pink Ears- hearing intact Oropharynx- clear Neck- supple  Lungs- Clear to ausculation bilaterally, normal work of breathing Heart- Regular rate and rhythm, no murmurs, rubs or gallops  GI- soft, NT, ND, + BS Extremities- no clubbing, cyanosis, or edema MS- no significant deformity or atrophy Skin- no rash or lesion Psych- euthymic mood, full affect Neuro- strength and sensation are intact  Wt Readings from Last 3 Encounters:  04/28/22 124.8 kg  04/14/22 126.2 kg  03/18/22 128.5 kg    EKG today demonstrates  SR Vent. rate 61 BPM PR interval 156 ms QRS duration 92 ms QT/QTcB 426/428 ms  Echo 04/29/21  demonstrated   1. Left ventricular ejection fraction, by estimation, is 20 to 25%. The  left ventricle has severely decreased function. The left ventricle  demonstrates global hypokinesis. The left ventricular internal cavity size was moderately dilated. Left ventricular diastolic parameters are consistent with Grade I diastolic dysfunction (impaired relaxation). The average left ventricular global longitudinal strain is -13.7 %. The global longitudinal strain is abnormal.   2. Right ventricular systolic function is normal. The right ventricular  size is normal. Tricuspid regurgitation signal is inadequate for assessing PA pressure.   3. The mitral valve is normal in structure. Trivial mitral valve  regurgitation. No evidence of mitral stenosis.   4. The aortic valve is tricuspid. Aortic valve regurgitation is not  visualized. No aortic stenosis is present.   5. The inferior vena cava is normal in size with greater than 50%  respiratory variability, suggesting right atrial pressure of 3 mmHg.   Epic records are reviewed at length today  CHA2DS2-VASc Score = 3  The patient's score is based upon: CHF History: 1 HTN History: 1 Diabetes History: 0 Stroke History: 0 Vascular Disease History: 0 Age Score: 0 Gender Score: 1       ASSESSMENT AND PLAN: 1. Paroxysmal Atrial Fibrillation/atrial flutter The patient's CHA2DS2-VASc score is 3, indicating a 3.2% annual risk of stroke.   Patient appears to be maintaining SR.  We discussed rhythm control options. Would like to avoid long term amiodarone if possible. She is agreeable to discussing afib/flutter ablation with Dr Curt Bears, will request follow up.  Continue amiodarone 200 mg daily Continue carvedilol 6.25 mg BID Continue Eliquis 5 mg BID  2. Secondary Hypercoagulable State (ICD10:  D68.69) The patient is at significant risk for stroke/thromboembolism based upon her CHA2DS2-VASc Score of 3.  Continue Apixaban (Eliquis).   3.  Obesity Body mass index is 41.24 kg/m. Lifestyle modification was discussed at length including regular exercise and weight reduction.  4. Chronic systolic CHF EF 54-62% Fluid status appears stable today. Followed in [facility].  5. HTN Stable, no changes today.   Follow up with Dr Curt Bears to discuss possible repeat ablation.    [facility] [address] [place], [place] [address] [phone] 04/28/2022 2:45 PM

## 2022-04-28 NOTE — Addendum Note (Signed)
Encounter addended by: Juluis Mire, RN on: 04/28/2022 3:20 PM  Actions taken: Visit diagnoses modified, Pharmacy for encounter modified, Order list changed, Diagnosis association updated

## 2022-04-28 NOTE — Addendum Note (Signed)
Encounter addended by: Juluis Mire, RN on: 04/28/2022 3:46 PM  Actions taken: Pharmacy for encounter modified, Order list changed

## 2022-05-27 ENCOUNTER — Other Ambulatory Visit (HOSPITAL_COMMUNITY): Payer: Self-pay | Admitting: *Deleted

## 2022-05-27 ENCOUNTER — Encounter (HOSPITAL_COMMUNITY): Payer: Self-pay | Admitting: Cardiology

## 2022-05-27 MED ORDER — CARVEDILOL 6.25 MG PO TABS: ORAL_TABLET | ORAL | 3 refills | Status: DC

## 2022-05-29 ENCOUNTER — Other Ambulatory Visit (HOSPITAL_COMMUNITY): Payer: Self-pay | Admitting: Cardiology

## 2022-06-09 ENCOUNTER — Ambulatory Visit: Admitting: Cardiology

## 2022-06-12 ENCOUNTER — Ambulatory Visit: Attending: Cardiology | Admitting: Cardiology

## 2022-06-12 ENCOUNTER — Encounter: Payer: Self-pay | Admitting: Cardiology

## 2022-06-12 VITALS — BP 130/82 | HR 62 | Ht 68.5 in | Wt 283.0 lb

## 2022-06-12 DIAGNOSIS — I484 Atypical atrial flutter: Secondary | ICD-10-CM | POA: Diagnosis not present

## 2022-06-12 DIAGNOSIS — D6869 Other thrombophilia: Secondary | ICD-10-CM

## 2022-06-12 DIAGNOSIS — I480 Paroxysmal atrial fibrillation: Secondary | ICD-10-CM | POA: Diagnosis not present

## 2022-06-12 NOTE — Progress Notes (Signed)
Electrophysiology Office Note   Date:  06/12/2022   ID:  Suzanne Long, DOB 1970/12/10, MRN [id number]  PCP:  Pcp, No  Cardiologist:  Shirlee Latch Primary Electrophysiologist:  Theresea Trautmann [name], MD    Chief Complaint: Atrial flutter   History of Present Illness: Suzanne Long is a 51 y.o. female who is being seen today for the evaluation of atrial flutter at the request of Fenton, Clint R, PA. Presenting today for electrophysiology evaluation.  She has a history seen for chronic systolic heart failure due to nonischemic cardiomyopathy, hypertension, obesity, atrial flutter, PVCs.  She had an atrial flutter ablation 01/22/2021.  She wore a cardiac monitor that showed a 5% PVC burden.  Her ejection fraction did not improve with ablation.  She is quite concerned about keloids and does not want an ICD implanted.  She presented to the emergency room 03/29/2022 with palpitations and shortness of breath.  She was found to be in a wide-complex tachycardia.  She was started on amiodarone which converted her to a 2-1 atrial flutter.  She converted to sinus rhythm then to atrial fibrillation, then to sinus rhythm again.  She was discharged on amiodarone.  Today, denies symptoms of palpitations, chest pain, shortness of breath, orthopnea, PND, lower extremity edema, claudication, dizziness, presyncope, syncope, bleeding, or neurologic sequela. The patient is tolerating medications without difficulties.  Since being seen in the emergency room she has done well.  She has had no further episodes of atrial fibrillation or atrial flutter.  Despite that, she would like to get off of her amiodarone.   Past Medical History:  Diagnosis Date   CHF (congestive heart failure) (HCC)    Dyspnea    Hypertension    Hypothyroidism    Joint pain    Multiple food allergies    NICM (nonischemic cardiomyopathy) (HCC)    a. Echo 2/17:  moderate LVH, EF 30-35%, diffuse HK, moderate LAE   NICM (nonischemic  cardiomyopathy) (HCC) 09/26/2020   Non-ST elevation (NSTEMI) myocardial infarction (HCC) 09/26/2020   Past Surgical History:  Procedure Laterality Date   A-FLUTTER ABLATION N/A 01/22/2021   Procedure: A-FLUTTER ABLATION;  Surgeon: Regan Lemming, MD;  Location: [facility] INVASIVE CV LAB;  Service: Cardiovascular;  Laterality: N/A;   BUBBLE STUDY  09/26/2020   Procedure: BUBBLE STUDY;  Surgeon: Meriam Sprague, MD;  Location: [facility] ENDOSCOPY;  Service: Cardiovascular;;   CARDIOVERSION N/A 09/26/2020   Procedure: CARDIOVERSION;  Surgeon: Meriam Sprague, MD;  Location: [facility] ENDOSCOPY;  Service: Cardiovascular;  Laterality: N/A;   LEFT HEART CATH AND CORONARY ANGIOGRAPHY N/A 09/24/2020   Procedure: LEFT HEART CATH AND CORONARY ANGIOGRAPHY;  Surgeon: Runell Gess, MD;  Location: [facility] INVASIVE CV LAB;  Service: Cardiovascular;  Laterality: N/A;   MYOMECTOMY     None     TEE WITHOUT CARDIOVERSION N/A 09/26/2020   Procedure: TRANSESOPHAGEAL ECHOCARDIOGRAM (TEE);  Surgeon: Meriam Sprague, MD;  Location: [facility] ENDOSCOPY;  Service: Cardiovascular;  Laterality: N/A;     Current Outpatient Medications  Medication Sig Dispense Refill   amiodarone (PACERONE) 200 MG tablet Take 1 tablet (200 mg total) by mouth daily. 30 tablet 3   apixaban (ELIQUIS) 5 MG TABS tablet Take 1 tablet (5 mg total) by mouth 2 (two) times daily. 60 tablet 3   carvedilol (COREG) 6.25 MG tablet TAKE 1 TABLET BY MOUTH TWICE DAILY 60 tablet 3   empagliflozin (JARDIANCE) 10 MG TABS tablet Take 1 tablet (10 mg total) by mouth daily. 90  tablet 1   furosemide (LASIX) 20 MG tablet Take 1 tablet (20 mg total) by mouth daily. 90 tablet 3   Multiple Vitamin (MULTIVITAMIN WITH MINERALS) TABS tablet Take 1 tablet by mouth daily. As needed     polyvinyl alcohol (LIQUIFILM TEARS) 1.4 % ophthalmic solution Place 1 drop into both eyes 3 (three) times daily as needed for dry eyes.     sacubitril-valsartan (ENTRESTO) 49-51 MG Take 1 tablet by  mouth 2 (two) times daily. 180 tablet 3   spironolactone (ALDACTONE) 25 MG tablet Take 1 tablet (25 mg total) by mouth daily. 90 tablet 3   Vitamin D, Ergocalciferol, (DRISDOL) 1.25 MG (50000 UNIT) CAPS capsule Take 1 capsule (50,000 Units total) by mouth every 7 (seven) days. 8 capsule 0   No current facility-administered medications for this visit.    Allergies:   Shellfish allergy, Gadolinium derivatives, Iodine, and Latex   Social History:  The patient  reports that she has never smoked. She has never used smokeless tobacco. She reports current alcohol use of about 2.0 - 3.0 standard drinks of alcohol per week. She reports that she does not use drugs.   Family History:  The patient's family history includes Depression in her mother; Diabetes in her mother; Hypertension in her mother; Kidney failure in her brother; Muscular dystrophy in her brother and son; Obesity in her mother.   ROS:  Please see the history of present illness.   Otherwise, review of systems is positive for none.   All other systems are reviewed and negative.   PHYSICAL EXAM: VS:  BP 130/82 (BP Location: Left Arm, Patient Position: Sitting, Cuff Size: Large)   Pulse 62   Ht 5' 8.5" (1.74 m)   Wt 283 lb (128.4 kg)   SpO2 97%   BMI 42.40 kg/m  , BMI Body mass index is 42.4 kg/m. GEN: Well nourished, well developed, in no acute distress  HEENT: normal  Neck: no JVD, carotid bruits, or masses Cardiac: RRR; no murmurs, rubs, or gallops,no edema  Respiratory:  clear to auscultation bilaterally, normal work of breathing GI: soft, nontender, nondistended, + BS MS: no deformity or atrophy  Skin: warm and dry Neuro:  Strength and sensation are intact Psych: euthymic mood, full affect  EKG:  EKG is not ordered today. Personal review of the ekg ordered 04/28/22 shows sinus rhythm  Recent Labs: 09/13/2021: B Natriuretic Peptide 87.2 03/29/2022: ALT 18; BUN 10; Creatinine, Ser 0.90; Hemoglobin 16.0; Magnesium 1.7;  Platelets 343; Potassium 4.3; Sodium 140 04/14/2022: TSH 3.110    Lipid Panel     Component Value Date/Time   CHOL 170 09/23/2020 0433   CHOL 175 05/31/2019 1140   TRIG 80 09/23/2020 0433   HDL 45 09/23/2020 0433   HDL 51 05/31/2019 1140   CHOLHDL 3.8 09/23/2020 0433   VLDL 16 09/23/2020 0433   LDLCALC 109 (H) 09/23/2020 0433   LDLCALC 112 (H) 05/31/2019 1140     Wt Readings from Last 3 Encounters:  06/12/22 283 lb (128.4 kg)  04/28/22 275 lb 3.2 oz (124.8 kg)  04/14/22 278 lb 3.2 oz (126.2 kg)      Other studies Reviewed: Additional studies/ records that were reviewed today include: Left heart catheterization 09/24/2020 Review of the above records today demonstrates:  There is severe left ventricular systolic dysfunction. LV end diastolic pressure is mildly elevated. The left ventricular ejection fraction is less than 25% by visual estimate.  Cardiac monitor 08/02/2021 personally reviewed 1. Predominantly NSR 2. 8  runs NSVT, longest 19 beats. 3. 23 runs SVT, longest 16 beats (?atrial tachycardia) 4. 5.7% PVCs  TTE 04/29/2021  1. Left ventricular ejection fraction, by estimation, is 20 to 25%. The  left ventricle has severely decreased function. The left ventricle  demonstrates global hypokinesis. The left ventricular internal cavity size  was moderately dilated. Left  ventricular diastolic parameters are consistent with Grade I diastolic  dysfunction (impaired relaxation). The average left ventricular global  longitudinal strain is -13.7 %. The global longitudinal strain is  abnormal.   2. Right ventricular systolic function is normal. The right ventricular  size is normal. Tricuspid regurgitation signal is inadequate for assessing  PA pressure.   3. The mitral valve is normal in structure. Trivial mitral valve  regurgitation. No evidence of mitral stenosis.   4. The aortic valve is tricuspid. Aortic valve regurgitation is not  visualized. No aortic stenosis is  present.   5. The inferior vena cava is normal in size with greater than 50%  respiratory variability, suggesting right atrial pressure of 3 mmHg.   ASSESSMENT AND PLAN:  1.  Typical atrial flutter: Status post ablation 01/22/2021.  No obvious recurrence.  2.  Atrial fibrillation/atypical atrial flutter: CHA2DS2-VASc of 3.  Currently on amiodarone 200 mg daily, carvedilol 6.25 mg twice daily, Eliquis 5 mg twice daily.  She unfortunately presented to the emergency room with atrial flutter and atrial fibrillation.  She was started on amiodarone.  She would like an alternative medication option.  Due to that, we Rockie Schnoor plan for ablation.  Risk and benefits have been discussed.  She understands these risks and is agreed to the procedure.  Risk, benefits, and alternatives to EP study and radiofrequency ablation for afib were also discussed in detail today. These risks include but are not limited to stroke, bleeding, vascular damage, tamponade, perforation, damage to the esophagus, lungs, and other structures, pulmonary vein stenosis, worsening renal function, and death. The patient understands these risk and wishes to proceed.  We Nalini Alcaraz therefore proceed with catheter ablation at the next available time.  Carto, ICE, anesthesia are requested for the procedure.  Ysabel Stankovich also obtain CT PV protocol prior to the procedure to exclude LAA thrombus and further evaluate atrial anatomy.   3.  Secondary hypercoagulable state: Currently on Eliquis for atrial fibrillation as above  4.  Chronic systolic heart failure: Due to nonischemic cardiomyopathy: Currently on Lasix, Toprol-XL, Entresto, Aldactone, Jardiance.  She has been hesitant to have an ICD implanted as she develops bothersome keloids.  5.  Hypertension: Well-controlled   Current medicines are reviewed at length with the patient today.   The patient does not have concerns regarding her medicines.  The following changes were made today: None  Labs/ tests  ordered today include:  No orders of the defined types were placed in this encounter.     Disposition:   FU 3 months  Signed, Dedee Liss Meredith Leeds, MD  06/12/2022 12:06 PM     [address] [place] [address] [phone] (office) [phone] (fax)

## 2022-06-12 NOTE — Patient Instructions (Signed)
Medication Instructions:  Your physician recommends that you continue on your current medications as directed. Please refer to the Current Medication list given to you today.  *If you need a refill on your cardiac medications before your next appointment, please call your pharmacy*   Lab Work: Pre procedure labs -- see procedure instruction letter:  BMP & CBC  If you have labs (blood work) drawn today and your tests are completely normal, you will receive your results only by: [place] (if you have MyChart) OR A paper copy in the mail If you have any lab test that is abnormal or we need to change your treatment, we will call you to review the results.   Testing/Procedures: Your physician has requested that you have cardiac CT within 7 days PRIOR to your ablation. Cardiac computed tomography (CT) is a painless test that uses an x-ray machine to take clear, detailed pictures of your heart.  Please follow instruction below located under "other instructions". You will get a call from our office to schedule the date for this test.  Your physician has recommended that you have an ablation. Catheter ablation is a medical procedure used to treat some cardiac arrhythmias (irregular heartbeats). During catheter ablation, a long, thin, flexible tube is put into a blood vessel in your groin (upper thigh), or neck. This tube is called an ablation catheter. It is then guided to your heart through the blood vessel. Radio frequency waves destroy small areas of heart tissue where abnormal heartbeats may cause an arrhythmia to start.   You will be scehduled on 10/21/2022.  We will contact you to review instructions (and send via mychart), along w/ scheduling you to come back within 30 days of the procedure.   Follow-Up: At [facility], you and your health needs are our priority.  As part of our continuing mission to provide you with exceptional heart care, we have created designated Provider Care  Teams.  These Care Teams include your primary Cardiologist (physician) and Advanced Practice Providers (APPs -  Physician Assistants and Nurse Practitioners) who all work together to provide you with the care you need, when you need it.  We recommend signing up for the patient portal called "MyChart".  Sign up information is provided on this After Visit Summary.  MyChart is used to connect with patients for Virtual Visits (Telemedicine).  Patients are able to view lab/test results, encounter notes, upcoming appointments, etc.  Non-urgent messages can be sent to your provider as well.   To learn more about what you can do with MyChart, go to [email or web address].    Your next appointment:   1 month(s) after your ablation  The format for your next appointment:   In Person  Provider:   AFib clinic   Thank you for choosing [facility]!!   Trinidad Curet, RN [phone]    Other Instructions   Cardiac Ablation Cardiac ablation is a procedure to destroy (ablate) some heart tissue that is sending bad signals. These bad signals cause problems in heart rhythm. The heart has many areas that make these signals. If there are problems in these areas, they can make the heart beat in a way that is not normal. Destroying some tissues can help make the heart rhythm normal. Tell your doctor about: Any allergies you have. All medicines you are taking. These include vitamins, herbs, eye drops, creams, and over-the-counter medicines. Any problems you or family members have had with medicines that make you fall asleep (anesthetics). Any  blood disorders you have. Any surgeries you have had. Any medical conditions you have, such as kidney failure. Whether you are pregnant or may be pregnant. What are the risks? This is a safe procedure. But problems may occur, including: Infection. Bruising and bleeding. Bleeding into the chest. Stroke or blood clots. Damage to nearby areas of your  body. Allergies to medicines or dyes. The need for a pacemaker if the normal system is damaged. Failure of the procedure to treat the problem. What happens before the procedure? Medicines Ask your doctor about: Changing or stopping your normal medicines. This is important. Taking aspirin and ibuprofen. Do not take these medicines unless your doctor tells you to take them. Taking other medicines, vitamins, herbs, and supplements. General instructions Follow instructions from your doctor about what you cannot eat or drink. Plan to have someone take you home from the hospital or clinic. If you will be going home right after the procedure, plan to have someone with you for 24 hours. Ask your doctor what steps will be taken to prevent infection. What happens during the procedure?  An IV tube will be put into one of your veins. You will be given a medicine to help you relax. The skin on your neck or groin will be numbed. A cut (incision) will be made in your neck or groin. A needle will be put through your cut and into a large vein. A tube (catheter) will be put into the needle. The tube will be moved to your heart. Dye may be put through the tube. This helps your doctor see your heart. Small devices (electrodes) on the tube will send out signals. A type of energy will be used to destroy some heart tissue. The tube will be taken out. Pressure will be held on your cut. This helps stop bleeding. A bandage will be put over your cut. The exact procedure may vary among doctors and hospitals. What happens after the procedure? You will be watched until you leave the hospital or clinic. This includes checking your heart rate, breathing rate, oxygen, and blood pressure. Your cut will be watched for bleeding. You will need to lie still for a few hours. Do not drive for 24 hours or as long as your doctor tells you. Summary Cardiac ablation is a procedure to destroy some heart tissue. This is done to  treat heart rhythm problems. Tell your doctor about any medical conditions you may have. Tell him or her about all medicines you are taking to treat them. This is a safe procedure. But problems may occur. These include infection, bruising, bleeding, and damage to nearby areas of your body. Follow what your doctor tells you about food and drink. You may also be told to change or stop some of your medicines. After the procedure, do not drive for 24 hours or as long as your doctor tells you. This information is not intended to replace advice given to you by your health care provider. Make sure you discuss any questions you have with your health care provider. Document Revised: 09/20/2021 Document Reviewed: 06/02/2019 Elsevier Patient Education  Home.

## 2022-07-17 ENCOUNTER — Ambulatory Visit (HOSPITAL_COMMUNITY)
Admission: RE | Admit: 2022-07-17 | Discharge: 2022-07-17 | Disposition: A | Discharge status: Home / Self Care | Source: Ambulatory Visit | Attending: Cardiology | Admitting: Cardiology

## 2022-07-17 ENCOUNTER — Encounter (HOSPITAL_COMMUNITY): Payer: Self-pay | Admitting: Cardiology

## 2022-07-17 VITALS — BP 146/90 | HR 84 | Wt 289.0 lb

## 2022-07-17 DIAGNOSIS — Z6841 Body Mass Index (BMI) 40.0 and over, adult: Secondary | ICD-10-CM | POA: Diagnosis not present

## 2022-07-17 DIAGNOSIS — Z8249 Family history of ischemic heart disease and other diseases of the circulatory system: Secondary | ICD-10-CM | POA: Diagnosis not present

## 2022-07-17 DIAGNOSIS — Z7901 Long term (current) use of anticoagulants: Secondary | ICD-10-CM | POA: Insufficient documentation

## 2022-07-17 DIAGNOSIS — I5022 Chronic systolic (congestive) heart failure: Secondary | ICD-10-CM | POA: Insufficient documentation

## 2022-07-17 DIAGNOSIS — I480 Paroxysmal atrial fibrillation: Secondary | ICD-10-CM | POA: Diagnosis not present

## 2022-07-17 DIAGNOSIS — M6281 Muscle weakness (generalized): Secondary | ICD-10-CM | POA: Insufficient documentation

## 2022-07-17 DIAGNOSIS — I483 Typical atrial flutter: Secondary | ICD-10-CM | POA: Insufficient documentation

## 2022-07-17 DIAGNOSIS — I110 Hypertensive heart disease with heart failure: Secondary | ICD-10-CM | POA: Diagnosis not present

## 2022-07-17 DIAGNOSIS — E039 Hypothyroidism, unspecified: Secondary | ICD-10-CM | POA: Diagnosis not present

## 2022-07-17 DIAGNOSIS — Z79899 Other long term (current) drug therapy: Secondary | ICD-10-CM | POA: Insufficient documentation

## 2022-07-17 DIAGNOSIS — I428 Other cardiomyopathies: Secondary | ICD-10-CM | POA: Diagnosis not present

## 2022-07-17 DIAGNOSIS — E669 Obesity, unspecified: Secondary | ICD-10-CM | POA: Diagnosis not present

## 2022-07-17 DIAGNOSIS — I493 Ventricular premature depolarization: Secondary | ICD-10-CM | POA: Insufficient documentation

## 2022-07-17 LAB — COMPREHENSIVE METABOLIC PANEL
ALT: 19 U/L (ref 0–44)
AST: 20 U/L (ref 15–41)
Albumin: 3.4 g/dL — ABNORMAL LOW (ref 3.5–5.0)
Alkaline Phosphatase: 91 U/L (ref 38–126)
Anion gap: 6 (ref 5–15)
BUN: 13 mg/dL (ref 6–20)
CO2: 24 mmol/L (ref 22–32)
Calcium: 9 mg/dL (ref 8.9–10.3)
Chloride: 105 mmol/L (ref 98–111)
Creatinine, Ser: 0.89 mg/dL (ref 0.44–1.00)
GFR, Estimated: >60 mL/min (ref >60)
Glucose, Bld: 94 mg/dL (ref 70–99)
Potassium: 3.8 mmol/L (ref 3.5–5.1)
Sodium: 135 mmol/L (ref 135–145)
Total Bilirubin: 0.3 mg/dL (ref 0.3–1.2)
Total Protein: 6.5 g/dL (ref 6.5–8.1)

## 2022-07-17 LAB — TSH: TSH: 2.745 u[IU]/mL (ref 0.350–4.500)

## 2022-07-17 MED ORDER — ENTRESTO 97-103 MG PO TABS: 1.0000 | ORAL_TABLET | Freq: Two times a day (BID) | ORAL | 11 refills | Status: DC

## 2022-07-17 MED ORDER — FUROSEMIDE 20 MG PO TABS: 20.0000 mg | ORAL_TABLET | ORAL | 3 refills | Status: DC

## 2022-07-17 NOTE — Progress Notes (Signed)
Patient ID: Suzanne Long, female   DOB: 08/06/1970, 52 y.o.   MRN: [id number] PCP: Dr. Margarita Rana, [facility] HF Cardiology: Dr. Aundra Dubin  52 y.o. with history of HTN and nonischemic cardiomyopathy presents for followup of CHF.      She was diagnosed with CHF initially in 2011.  At that time, she was hospitalized with volume overload.  Coronary angiography in 2011 showed no significant CAD, but EF was about 25%.  BP was very high.  She was started on cardiac meds and BP improved.  Per her report, EF improved.  She eventually stopped all her medications.  She was then hospitalized in 11/16 with CHF exacerbation. EF was 30-35% on 11/16 echo and 30-35% on 2/17 echo.   Echo in 7/17 showed EF up to 40%.   She stopped her medications again.  She was then admitted in 3/22 with atrial flutter/RVR and CHF.  Unsure how long atrial flutter was present as she did not feel palpitations.  She had TEE-guided DCCV.  TEE showed EF 20-25% with severely decreased RV function, mild MR.  LHC in 3/22 showed no significant CAD.  Cardiac MRI in 6/22 showed LV EF 31%, RVEF 52%, basal-mid lateral subendocardial LGE (>50% wall thickness), septal mid-wall LGE.  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 She was seen by Dr. Broadus John, and this gene mutation was actually not thought to be the cause of DMD in her family (she likely has a mutation that was not picked up by the [employer] testing).   In 7/22, she had atrial flutter ablation.   Echo in 10/22 showed EF 20-25% with moderate LV dilation, normal RV, normal IVC.   Patient went to the ER in 06/19/21 with SVT, ?atypical flutter with 2:1 block.  She was in the ER 07/11/21 with SVT rate >200, she was cardioverted by EMS and she was in NSR in the ER.  Zio monitor in 1/23 showed 8 runs NSVT, longest 19 beats and 23 runs SVT (?atrial tachycardia), longest 16 beats.  No atrial fibrillation.   Follow up 1/23 She felt Coreg eliminated  palpitations better than Toprol XL. She continued with chronic muscle ache with activity and BP on the low side. Hydralazine and Imdur were stopped.   Follow up with Dr. Curt Bears 1/23, discussed ICD but she is hesitant due to her keloids. Eliquis stopped.   In 9/23, she had both atypical atrial flutter and atrial fibrillation, she converted to NSR with amiodarone.   Today she returns for HF follow up with her husband. She has had no further palpitations on amiodarone.  She does not have dyspnea with her usual activities.  No chest pain.  She still has muscle weakness but this is unchanged.  No orthopnea/PND.  No lightheadedness.  Weight is up about 6 lbs.   Labs (3/17): K 3.7, creatinine 0.84, SPEP negative, TSH normal.  Labs (5/17): K 3.9, creatinine 0.81, BNP normal Labs (3/22): K 5.2, creatinine 1.0 Labs (7/22): CK 1214 Labs (9/22): K 4.3, creatinine 1.04 Labs (12/22): TSH elevated at 6.6, free T4 normal, K 3.6, creatinine 1.14, BNP 161 Labs (1/23): K 4.3, creatinine 0.89 Labs (3/23): K 4.4, creatinine 1.07, BNP 87 Labs (6/23): K 4.1, creatinine 0.94 Labs (9/23): K 4, creatinine 1.13  ECG (personally reviewed): NSR, septal Qs, lateral TWIs  PMH: 1. Chronic systolic CHF: 1st noted in 2011.  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  - LHC (2011) with no significant CAD, EF 25%.   - Echo (11/16) with EF 30-35%, diffuse hypokinesis. - Echo (2/17) with EF 30-35%, moderate LVH, diffuse hypokinesis.  - Echo (7/17) with EF 40%, severe LV dilation, grade II diastolic dysfunction.  - TEE (3/22) with EF 20-25% with severely decreased RV function, mild MR - LHC (3/22): No significant CAD.  - Cardiac MRI (6/22): LV EF 31%, RVEF 52%, basal-mid lateral subendocardial LGE (>50% wall thickness), septal mid-wall LGE.   - Echo (10/22): EF 20-25% with moderate LV dilation,  normal RV, normal IVC.  2. HTN 3. Hypothyroidism 4. Obesity.  5. Goiter 6. Atrial flutter: 3/22, s/p DCCV.  - Ablation in 7/22.  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  8. PVCs: Zio monitor (8/22) with 8% PVCs, no AF/AFL 9. SVT: noted in ER x 2 in 12/22, once possible atypical flutter and once cardioverted before reached ER.  - Zio (1/23): 8 runs NSVT, longest 19 beats and 23 runs SVT (?atrial tachycardia), longest 16 beats.  No atrial fibrillation.  10. Atrial fibrillation noted in 9/23.   SH:  Nonsmoker.  Rare ETOH.  No drugs.    FH: Mother, grandmother with HTN.  Uncle with MI. 2 sons with Duchenne muscular dystrophy (both have passed away).  Brother with Duchenne muscular dystrophy.  ROS: All systems reviewed and negative except as per HPI.   Current Outpatient Medications  Medication Sig Dispense Refill   amiodarone (PACERONE) 200 MG tablet Take 1 tablet (200 mg total) by mouth daily. 30 tablet 3   apixaban (ELIQUIS) 5 MG TABS tablet Take 1 tablet (5 mg total) by mouth 2 (two) times daily. 60 tablet 3   carvedilol (COREG) 6.25 MG tablet TAKE 1 TABLET BY MOUTH TWICE DAILY 60 tablet 3   empagliflozin (JARDIANCE) 10 MG TABS tablet Take 1 tablet (10 mg total) by mouth daily. 90 tablet 1   Multiple Vitamin (MULTIVITAMIN WITH MINERALS) TABS tablet Take 1 tablet by mouth daily. As needed     polyvinyl alcohol (LIQUIFILM TEARS) 1.4 % ophthalmic solution Place 1 drop into both eyes 3 (three) times daily as needed for dry eyes.     sacubitril-valsartan (ENTRESTO) 97-103 MG Take 1 tablet by mouth 2 (two) times daily. 60 tablet 11   spironolactone (ALDACTONE) 25 MG tablet Take 1 tablet (25 mg total) by mouth daily. 90 tablet 3   Vitamin D, Ergocalciferol, (DRISDOL) 1.25 MG (50000 UNIT) CAPS capsule Take 1 capsule (50,000 Units total) by mouth every 7 (seven) days. 8 capsule 0   furosemide (LASIX) 20 MG tablet Take 1 tablet (20 mg total) by mouth every other day.  90 tablet 3   No current facility-administered medications for this encounter.   Wt Readings from Last 3 Encounters:  07/17/22 131.1 kg (289 lb)  06/12/22 128.4 kg (283 lb)  04/28/22 124.8 kg (275 lb 3.2 oz)   BP (!) 146/90   Pulse 84   Wt 131.1 kg (289 lb)   SpO2 97%   BMI 43.30 kg/m  General: NAD Neck: No JVD, had goiter.  Lungs: Clear to auscultation bilaterally with normal respiratory effort. CV: Nondisplaced PMI.  Heart regular S1/S2, no S3/S4, no murmur.  No peripheral edema.  No carotid bruit.  Normal pedal pulses.  Abdomen: Soft, nontender, no hepatosplenomegaly, no distention.  Skin: Intact without lesions or rashes.  Neurologic: Alert and oriented x 3.  Psych: Normal  affect. Extremities: No clubbing or cyanosis.  HEENT: Normal.   Assessment/Plan: 1. Chronic systolic CHF: Nonischemic cardiomyopathy. No CAD on coronary angiography in 2011 and 3/22.  She had 2 sons and a brother with Duchenne's muscular dystrophy.  She notes chronic muscle weakness with squat->stand.  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  TEE in 3/22 showed EF 20-25% with severe RV dysfunction.  Cardiac MRI in 6/22 showed LV EF 31%, RVEF 52%, basal-mid lateral subendocardial LGE (>50% wall thickness), septal mid-wall LGE.  LGE, especially in the inferolateral wall, is seen with DMD-related cardiomyopathy. Echo in 10/22 showed EF 20-25%, moderate LV dilation, normal RV.  NYHA class I, not volume overloaded. - Continue Coreg 6.25 mg bid. - Increase Entresto to 97/103 bid, BMET/BNP today and BMET in 10 days.  - Decrease Lasix to 20 mg every other day.  - Continue spironolactone 25 mg daily. - Continue Jardiance 10 mg daily.   - Echo in 3 months.  - She does not want to have an ICD placed though I have strongly recommended it given significant LGE present on cMRI.  She is very concerned about  keloid formation, apparently very pruritic and bothersome when these have formed on her in the past.  We discussed this again today, she continues to decline. 2. HTN: Increase Entresto as above.  3. H/o hypothyroidism: No thyroid replacement. Previously following with endocrinology for goiter. Advised follow up. - Recent TFTs stable. 4. Obesity: We discussed diet and exercise for weight loss again.  Body mass index is 43.3 kg/m. - her insurance will not cover semaglutide for weight loss. 5. Atrial flutter: Typical flutter in 3/22 with CHF decompensation.  Now s/p DCCV back to NSR and atrial flutter ablation.  6. DMD heterozygote: I suspect this is the cause of her mild chronic muscle weakness. CPK has been mildly elevated.  7. PVCs: Frequent on 8/22 Zio monitor, 8% total. 5.7% on 1/23 Zio monitor.  - She is now on amiodarone with PAF.   - Continue Coreg as above.  8. SVT: Noted on Zio in 1/23, ? atrial tachy.  9. Atrial fibrillation: Paroxysmal, noted in 9/23 and converted to NSR with amiodarone (she was symptomatic in AF).  - Plan for atrial fibrillation ablation in 4/24.  - Continue amiodarone 200 mg daily for now.  Check LFTs and TSH. She will need regular eye exam. Eventually after ablation, would like to stop amiodarone.  - Continue Eliquis.  She will need to continue this long-term.   Followup in 1 month with HF pharmacist for med titration (Coreg).  Followup with me in 3 months with echo.   Loralie Champagne 07/17/2022

## 2022-07-17 NOTE — Patient Instructions (Addendum)
INCREASE Entrresto to 97/$RemoveBeforeD'103mg'[id number]$  Twice daily  DECREASE Lasix to every other day.  Labs done today, your results will be available in MyChart, we will contact you for abnormal readings.  Repeat blood work in 10 days.  Your physician has requested that you have an echocardiogram. Echocardiography is a painless test that uses sound waves to create images of your heart. It provides your doctor with information about the size and shape of your heart and how well your heart's chambers and valves are working. This procedure takes approximately one hour. There are no restrictions for this procedure. Please do NOT wear cologne, perfume, aftershave, or lotions (deodorant is allowed). Please arrive 15 minutes prior to your appointment time.  Please follow up with our heart failure pharmacist in 1 month  Your physician recommends that you schedule a follow-up appointment in: 3 months  If you have any questions or concerns before your next appointment please send [place] a message through [place] or call our office at [phone].    TO LEAVE A MESSAGE FOR THE NURSE SELECT OPTION 2, PLEASE LEAVE A MESSAGE INCLUDING: YOUR NAME DATE OF BIRTH CALL BACK NUMBER REASON FOR CALL**this is important as we prioritize the call backs  YOU WILL RECEIVE A CALL BACK THE SAME DAY AS LONG AS YOU CALL BEFORE 4:00 PM  At the [facility], you and your health needs are our priority. As part of our continuing mission to provide you with exceptional heart care, we have created designated Provider Care Teams. These Care Teams include your primary Cardiologist (physician) and Advanced Practice Providers (APPs- Physician Assistants and Nurse Practitioners) who all work together to provide you with the care you need, when you need it.   You may see any of the following providers on your designated Care Team at your next follow up: Dr Glori Bickers Dr Loralie Champagne Dr. Roxana Hires, NP Lyda Jester, [place] [facility] [place], [place] Forestine Na, NP Audry Riles, PharmD   Please be sure to bring in all your medications bottles to every appointment.

## 2022-07-18 ENCOUNTER — Encounter (HOSPITAL_COMMUNITY): Admitting: Cardiology

## 2022-07-20 ENCOUNTER — Other Ambulatory Visit (INDEPENDENT_AMBULATORY_CARE_PROVIDER_SITE_OTHER): Payer: Self-pay | Admitting: Primary Care

## 2022-07-20 DIAGNOSIS — E559 Vitamin D deficiency, unspecified: Secondary | ICD-10-CM

## 2022-07-20 MED ORDER — ERGOCALCIFEROL 200 MCG/ML PO SOLN: 2000.0000 [IU] | Freq: Every day | ORAL | 3 refills | Status: DC

## 2022-07-28 ENCOUNTER — Ambulatory Visit (HOSPITAL_COMMUNITY)
Admission: RE | Admit: 2022-07-28 | Discharge: 2022-07-28 | Disposition: A | Discharge status: Home / Self Care | Source: Ambulatory Visit | Attending: Cardiology | Admitting: Cardiology

## 2022-07-28 DIAGNOSIS — I5022 Chronic systolic (congestive) heart failure: Secondary | ICD-10-CM | POA: Insufficient documentation

## 2022-07-28 LAB — BASIC METABOLIC PANEL
Anion gap: 6 (ref 5–15)
BUN: 5 mg/dL — ABNORMAL LOW (ref 6–20)
CO2: 29 mmol/L (ref 22–32)
Calcium: 8.9 mg/dL (ref 8.9–10.3)
Chloride: 105 mmol/L (ref 98–111)
Creatinine, Ser: 0.91 mg/dL (ref 0.44–1.00)
GFR, Estimated: >60 mL/min (ref >60)
Glucose, Bld: 98 mg/dL (ref 70–99)
Potassium: 4.4 mmol/L (ref 3.5–5.1)
Sodium: 140 mmol/L (ref 135–145)

## 2022-07-30 ENCOUNTER — Telehealth: Payer: Self-pay | Admitting: Cardiology

## 2022-07-30 ENCOUNTER — Encounter: Payer: Self-pay | Admitting: Cardiology

## 2022-07-30 DIAGNOSIS — I480 Paroxysmal atrial fibrillation: Secondary | ICD-10-CM

## 2022-07-30 MED ORDER — APIXABAN 5 MG PO TABS: 5.0000 mg | ORAL_TABLET | Freq: Two times a day (BID) | ORAL | 3 refills | Status: DC

## 2022-07-30 NOTE — Telephone Encounter (Signed)
Patient calling the office for samples of medication:   1.  What medication and dosage are you requesting samples for? apixaban (ELIQUIS) 5 MG TABS tablet  2.  Are you currently out of this medication? Yes    

## 2022-07-30 NOTE — Telephone Encounter (Signed)
Hey Lynn, LPN, can you please advise on this matter? Thanks  ?

## 2022-07-30 NOTE — Telephone Encounter (Signed)
*  STAT* If patient is at the pharmacy, call can be transferred to refill team.   1. Which medications need to be refilled? (please list name of each medication and dose if known) apixaban (ELIQUIS) 5 MG TABS tablet   2. Which pharmacy/location (including street and city if local pharmacy) is medication [facility] sent [facility]   3. Do they need a 30 day or 90 day supply? [place]

## 2022-07-30 NOTE — Telephone Encounter (Signed)
Sample request for Eliquis received. Indication: Afib  Last office visit: 07/17/22 Suzanne Long)  Scr: 0.91 (07/28/22)  Age: 52 Weight: 131.1kg  Eliquis $Remove'5mg'[id number]$  BID is appropriate dose.

## 2022-07-30 NOTE — Telephone Encounter (Signed)
Prescription refill request for Eliquis received. Indication: Afib  Last office visit: 07/17/22 Aundra Dubin)  Scr: 0.91 (07/28/22)  Age: 52 Weight: 131.1kg  Appropriate dose and refill sent to requested pharmacy.

## 2022-07-31 NOTE — Telephone Encounter (Signed)
**Note De-Identified Suzanne Long Obfuscation** Per the [name] message, she is requesting Eliquis samples while waiting for hers to arrive from mail in pharmacy.  This does not involve a Eliquis or Pt Asst. for Eliquis  Forwarding this message to Dr [employer] nurse and triage for determination on providing Eliquis samples to this pt.

## 2022-08-01 NOTE — Telephone Encounter (Signed)
Called pt and left message informing pt that we were leaving 1 box of sample of Eliquis 5 mg tablet at the [employer] office at the front desk for pt to pick up and if she has any other problems, questions or concerns, to give our office a call back.   Lot #  [id number]  Exp: 12/2023

## 2022-08-01 NOTE — Telephone Encounter (Signed)
Cathy, in refills, is handling this matter

## 2022-08-10 ENCOUNTER — Encounter (INDEPENDENT_AMBULATORY_CARE_PROVIDER_SITE_OTHER): Payer: Self-pay | Admitting: Primary Care

## 2022-08-14 ENCOUNTER — Other Ambulatory Visit (HOSPITAL_COMMUNITY): Payer: Self-pay | Admitting: *Deleted

## 2022-08-14 MED ORDER — EMPAGLIFLOZIN 10 MG PO TABS: 10.0000 mg | ORAL_TABLET | Freq: Every day | ORAL | 1 refills | Status: DC

## 2022-08-14 MED ORDER — CARVEDILOL 6.25 MG PO TABS: ORAL_TABLET | ORAL | 3 refills | Status: DC

## 2022-08-14 MED ORDER — SPIRONOLACTONE 25 MG PO TABS: 25.0000 mg | ORAL_TABLET | Freq: Every day | ORAL | 3 refills | Status: DC

## 2022-08-18 ENCOUNTER — Emergency Department (HOSPITAL_COMMUNITY)

## 2022-08-18 ENCOUNTER — Encounter (HOSPITAL_COMMUNITY): Payer: Self-pay | Admitting: Emergency Medicine

## 2022-08-18 ENCOUNTER — Emergency Department (HOSPITAL_COMMUNITY)
Admission: EM | Admit: 2022-08-18 | Discharge: 2022-08-18 | Disposition: A | Discharge status: Home / Self Care | Attending: Emergency Medicine | Admitting: Emergency Medicine

## 2022-08-18 ENCOUNTER — Other Ambulatory Visit: Payer: Self-pay

## 2022-08-18 DIAGNOSIS — I110 Hypertensive heart disease with heart failure: Secondary | ICD-10-CM | POA: Diagnosis not present

## 2022-08-18 DIAGNOSIS — K625 Hemorrhage of anus and rectum: Secondary | ICD-10-CM | POA: Diagnosis present

## 2022-08-18 DIAGNOSIS — I2510 Atherosclerotic heart disease of native coronary artery without angina pectoris: Secondary | ICD-10-CM | POA: Diagnosis not present

## 2022-08-18 DIAGNOSIS — Z91040 Latex allergy status: Secondary | ICD-10-CM | POA: Diagnosis not present

## 2022-08-18 DIAGNOSIS — Z79899 Other long term (current) drug therapy: Secondary | ICD-10-CM | POA: Diagnosis not present

## 2022-08-18 DIAGNOSIS — Z7901 Long term (current) use of anticoagulants: Secondary | ICD-10-CM | POA: Diagnosis not present

## 2022-08-18 DIAGNOSIS — I509 Heart failure, unspecified: Secondary | ICD-10-CM | POA: Diagnosis not present

## 2022-08-18 LAB — COMPREHENSIVE METABOLIC PANEL
ALT: 18 U/L (ref 0–44)
AST: 24 U/L (ref 15–41)
Albumin: 3.7 g/dL (ref 3.5–5.0)
Alkaline Phosphatase: 88 U/L (ref 38–126)
Anion gap: 9 (ref 5–15)
BUN: 9 mg/dL (ref 6–20)
CO2: 25 mmol/L (ref 22–32)
Calcium: 9 mg/dL (ref 8.9–10.3)
Chloride: 103 mmol/L (ref 98–111)
Creatinine, Ser: 1.12 mg/dL — ABNORMAL HIGH (ref 0.44–1.00)
GFR, Estimated: 60 mL/min — ABNORMAL LOW (ref >60)
Glucose, Bld: 103 mg/dL — ABNORMAL HIGH (ref 70–99)
Potassium: 4.2 mmol/L (ref 3.5–5.1)
Sodium: 137 mmol/L (ref 135–145)
Total Bilirubin: 0.7 mg/dL (ref 0.3–1.2)
Total Protein: 7 g/dL (ref 6.5–8.1)

## 2022-08-18 LAB — CBC WITH DIFFERENTIAL/PLATELET
Abs Immature Granulocytes: 0.02 10*3/uL (ref 0.00–0.07)
Basophils Absolute: 0 10*3/uL (ref 0.0–0.1)
Basophils Relative: 0 %
Eosinophils Absolute: 0.2 10*3/uL (ref 0.0–0.5)
Eosinophils Relative: 5 %
HCT: 43.8 % (ref 36.0–46.0)
Hemoglobin: 14.4 g/dL (ref 12.0–15.0)
Immature Granulocytes: 0 %
Lymphocytes Relative: 41 %
Lymphs Abs: 1.9 10*3/uL (ref 0.7–4.0)
MCH: 30.5 pg (ref 26.0–34.0)
MCHC: 32.9 g/dL (ref 30.0–36.0)
MCV: 92.8 fL (ref 80.0–100.0)
Monocytes Absolute: 0.4 10*3/uL (ref 0.1–1.0)
Monocytes Relative: 8 %
Neutro Abs: 2.1 10*3/uL (ref 1.7–7.7)
Neutrophils Relative %: 46 %
Platelets: 255 10*3/uL (ref 150–400)
RBC: 4.72 MIL/uL (ref 3.87–5.11)
RDW: 13.4 % (ref 11.5–15.5)
WBC: 4.6 10*3/uL (ref 4.0–10.5)
nRBC: 0 % (ref 0.0–0.2)

## 2022-08-18 LAB — URINALYSIS, ROUTINE W REFLEX MICROSCOPIC
Bilirubin Urine: NEGATIVE
Glucose, UA: NEGATIVE mg/dL
Hgb urine dipstick: NEGATIVE
Ketones, ur: NEGATIVE mg/dL
Leukocytes,Ua: NEGATIVE
Nitrite: NEGATIVE
Protein, ur: NEGATIVE mg/dL
Specific Gravity, Urine: 1.013 (ref 1.005–1.030)
pH: 5 (ref 5.0–8.0)

## 2022-08-18 LAB — I-STAT BETA HCG BLOOD, ED (MC, WL, AP ONLY): I-stat hCG, quantitative: <5 m[IU]/mL (ref <5)

## 2022-08-18 LAB — MAGNESIUM: Magnesium: 1.9 mg/dL (ref 1.7–2.4)

## 2022-08-18 LAB — LIPASE, BLOOD: Lipase: 29 U/L (ref 11–51)

## 2022-08-18 MED ORDER — LACTATED RINGERS IV BOLUS: 1000.0000 mL | Freq: Once | INTRAVENOUS | Status: DC

## 2022-08-18 MED ORDER — LACTATED RINGERS IV BOLUS
500.0000 mL | Freq: Once | INTRAVENOUS | Status: AC
  Administered 2022-08-18: 500 mL via INTRAVENOUS

## 2022-08-18 MED ORDER — OXYCODONE-ACETAMINOPHEN 5-325 MG PO TABS
1.0000 | ORAL_TABLET | Freq: Once | ORAL | Status: AC
  Administered 2022-08-18: 1 via ORAL
  Filled 2022-08-18: qty 1

## 2022-08-18 NOTE — ED Notes (Signed)
Patient transported to CT 

## 2022-08-18 NOTE — ED Provider Notes (Signed)
[facility] Provider Note   CSN: [id number] Arrival date & time: 08/18/22  4098     History  Chief Complaint  Patient presents with   Rectal Bleeding    Suzanne Long is a 52 y.o. female.  HPI Patient presents for rectal bleeding.  Medical history includes CHF, HTN, atrial flutter, CAD.  She is on Eliquis and amiodarone.  She denies any history of GI bleeding.  She does have a history of constipation.  Last bowel movement was 2 days ago.  Bowel movement at the time was small.  This was also the time when she first noticed red blood in the toilet.  Since that time, she has had additional episodes of rectal bleeding yesterday and this morning.  In addition to her painless rectal bleeding, patient has also had lower abdominal pain.  She had some nausea yesterday which resolved.  She denies any other recent symptoms.    Home Medications Prior to Admission medications   Medication Sig Start Date End Date Taking? Authorizing Provider  amiodarone (PACERONE) 200 MG tablet Take 1 tablet (200 mg total) by mouth daily. 04/28/22   Fenton, Clint R, PA  apixaban (ELIQUIS) 5 MG TABS tablet Take 1 tablet (5 mg total) by mouth 2 (two) times daily. 07/30/22 07/25/23  Belva Crome, MD  carvedilol (COREG) 6.25 MG tablet TAKE 1 TABLET BY MOUTH TWICE DAILY 08/14/22   Larey Dresser, MD  empagliflozin (JARDIANCE) 10 MG TABS tablet Take 1 tablet (10 mg total) by mouth daily. 08/14/22   Larey Dresser, MD  ergocalciferol, VITAMIN D2, (DRISDOL) 200 MCG/ML drops Take 0.3 mLs (2,400 Units total) by mouth daily. 07/20/22   Kerin Perna, NP  furosemide (LASIX) 20 MG tablet Take 1 tablet (20 mg total) by mouth every other day. 07/17/22   Larey Dresser, MD  Multiple Vitamin (MULTIVITAMIN WITH MINERALS) TABS tablet Take 1 tablet by mouth daily. As needed    [provider]  polyvinyl alcohol (LIQUIFILM TEARS) 1.4 % ophthalmic solution Place 1 drop into both  eyes 3 (three) times daily as needed for dry eyes.    [provider]  sacubitril-valsartan (ENTRESTO) 97-103 MG Take 1 tablet by mouth 2 (two) times daily. 07/17/22   Larey Dresser, MD  spironolactone (ALDACTONE) 25 MG tablet Take 1 tablet (25 mg total) by mouth daily. 08/14/22   Larey Dresser, MD  Vitamin D, Ergocalciferol, (DRISDOL) 1.25 MG (50000 UNIT) CAPS capsule TAKE 1 CAPSULE BY MOUTH EVERY 7 DAYS 07/20/22   Kerin Perna, NP      Allergies    Shellfish allergy, Gadolinium derivatives, Iodine, and Latex    Review of Systems   Review of Systems  Gastrointestinal:  Positive for abdominal pain, anal bleeding and nausea.  All other systems reviewed and are negative.   Physical Exam Updated Vital Signs BP (!) 159/101   Pulse 64   Temp 98 F (36.7 C) (Oral)   Resp 16   Ht $R'5\' 8"'JV$  (1.727 m)   Wt 127 kg   SpO2 100%   BMI 42.57 kg/m  Physical Exam Vitals and nursing note reviewed. Exam conducted with a chaperone present.  Constitutional:      General: She is not in acute distress.    Appearance: Normal appearance. She is well-developed. She is not ill-appearing, toxic-appearing or diaphoretic.  HENT:     Head: Normocephalic and atraumatic.     Right Ear: External ear  normal.     Left Ear: External ear normal.     Nose: Nose normal.  Eyes:     Extraocular Movements: Extraocular movements intact.     Conjunctiva/sclera: Conjunctivae normal.  Cardiovascular:     Rate and Rhythm: Normal rate and regular rhythm.  Pulmonary:     Effort: Pulmonary effort is normal. No respiratory distress.  Abdominal:     General: There is no distension.     Palpations: Abdomen is soft.     Tenderness: There is abdominal tenderness. There is no guarding or rebound.  Genitourinary:    Rectum: Internal hemorrhoid present. No anal fissure or external hemorrhoid.     Comments: No blood or melena present on DRE Musculoskeletal:        General: No swelling. Normal range of motion.      Cervical back: Normal range of motion and neck supple.     Right lower leg: No edema.     Left lower leg: No edema.  Skin:    General: Skin is warm and dry.     Capillary Refill: Capillary refill takes less than 2 seconds.     Coloration: Skin is not jaundiced or pale.  Neurological:     General: No focal deficit present.     Mental Status: She is alert and oriented to person, place, and time.     Cranial Nerves: No cranial nerve deficit.     Sensory: No sensory deficit.     Motor: No weakness.     Coordination: Coordination normal.  Psychiatric:        Mood and Affect: Mood normal.        Behavior: Behavior normal.        Thought Content: Thought content normal.        Judgment: Judgment normal.     ED Results / Procedures / Treatments   Labs (all labs ordered are listed, but only abnormal results are displayed) Labs Reviewed  COMPREHENSIVE METABOLIC PANEL - Abnormal; Notable for the following components:      Result Value   Glucose, Bld 103 (*)    Creatinine, Ser 1.12 (*)    GFR, Estimated 60 (*)    All other components within normal limits  URINALYSIS, ROUTINE W REFLEX MICROSCOPIC - Abnormal; Notable for the following components:   APPearance HAZY (*)    All other components within normal limits  LIPASE, BLOOD  CBC WITH DIFFERENTIAL/PLATELET  MAGNESIUM  I-STAT BETA HCG BLOOD, ED ([facility], [facility], [facility] ONLY)    EKG None  Radiology CT ABDOMEN PELVIS WO CONTRAST  Result Date: 08/18/2022 CLINICAL DATA:  Bright red blood per rectum EXAM: CT ABDOMEN AND PELVIS WITHOUT CONTRAST TECHNIQUE: Multidetector CT imaging of the abdomen and pelvis was performed following the standard protocol without IV contrast. RADIATION DOSE REDUCTION: This exam was performed according to the departmental dose-optimization program which includes automated exposure control, adjustment of the mA and/or kV according to patient size and/or use of iterative reconstruction technique. COMPARISON:  None  Available. FINDINGS: Lower chest: Cardiomegaly. No pericardial effusion. Mild dependent atelectasis. Hepatobiliary: No focal liver abnormality is seen. No gallstones, gallbladder wall thickening, or biliary dilatation. Pancreas: Unremarkable. No pancreatic ductal dilatation or surrounding inflammatory changes. Spleen: Normal in size without focal abnormality. Adrenals/Urinary Tract: Adrenal glands are unremarkable. Kidneys are normal, without renal calculi, focal lesion, or hydronephrosis. Bladder is unremarkable. Stomach/Bowel: Colonic diverticular disease without CT evidence of active inflammation. Limited evaluation for lower GI bleeding in the absence of intravenous  contrast. No evidence of obstruction or focal bowel wall thickening. Normal appendix in the right lower quadrant. The terminal ileum is unremarkable. Vascular/Lymphatic: Limited evaluation in the absence of intravenous contrast. No aneurysm. No significant atherosclerotic plaque. No suspicious lymphadenopathy. Reproductive: Degenerated calcified uterine fibroids. No adnexal mass. Other: Fat containing umbilical hernia.  No evidence of ascites. Musculoskeletal: No acute fracture or aggressive appearing lytic or blastic osseous lesion. L5-S1 degenerative disc disease. IMPRESSION: 1. Limited evaluation for a source of lower GI bleeding in the absence of intravenous contrast. 2. Colonic diverticular disease without CT evidence of active inflammation. 3. Cardiomegaly. 4. Small degenerated calcified uterine fibroids. 5. Fat containing umbilical hernia. 6. L5-S1 degenerative disc disease. Electronically Signed   By: Jacqulynn Cadet M.D.   On: 08/18/2022 12:33    Procedures Procedures    Medications Ordered in ED Medications  oxyCODONE-acetaminophen (PERCOCET/ROXICET) 5-325 MG per tablet 1 tablet (1 tablet Oral Given 08/18/22 1023)  lactated ringers bolus 500 mL (0 mLs Intravenous Stopped 08/18/22 1359)    ED Course/ Medical Decision Making/ A&P                              Medical Decision Making Amount and/or Complexity of Data Reviewed Labs: ordered. Radiology: ordered.  Risk Prescription drug management.   This patient presents to the ED for concern of rectal bleeding, this involves an extensive number of treatment options, and is a complaint that carries with it a high risk of complications and morbidity.  The differential diagnosis includes hemorrhoidal bleed, diverticular bleed, colitis, anal fissure   Co morbidities that complicate the patient evaluation  CHF, HTN, atrial flutter, CAD   Additional history obtained:  Additional history obtained from N/A External records from outside source obtained and reviewed including EMR   Lab Tests:  I Ordered, and personally interpreted labs.  The pertinent results include: No drop in hemoglobin, no leukocytosis, creatinine is slightly increased from baseline, electrolytes are normal.   Imaging Studies ordered:  I ordered imaging studies including CT of abdomen and pelvis I independently visualized and interpreted imaging which showed no acute findings.  Diverticula are present. I agree with the radiologist interpretation   Cardiac Monitoring: / EKG:  The patient was maintained on a cardiac monitor.  I personally viewed and interpreted the cardiac monitored which showed an underlying rhythm of: Sinus rhythm   Problem List / ED Course / Critical interventions / Medication management  Patient presents for lower abdominal pain and recent painless rectal bleeding.  The symptoms have been present over the past 2 days.  On arrival in the ED, patient is well-appearing.  Vital signs are notable for hypertension and slight tachycardia.  She denies any recent near syncopal symptoms.  She is on Eliquis.  Patient provided a picture of the blood present in toilet bowl.  On this picture, it does appear that it is a small amount of red blood mixed in with the water.  I have low  suspicion of clinically significant blood loss.  On digital rectal exam, performed with nurse chaperone present, there are internal hemorrhoids.  These are not currently bleeding.  There is no blood or melena on the glove following DRE.  She does have lower abdominal tenderness.  Patient to undergo lab work and CT imaging.  Percocet was ordered for analgesia.  Patient's lab work is reassuring.  Her hemoglobin does not show any acute drop.  Pedal biliary enzymes are normal.  Her creatinine is slightly increased from baseline and IV fluids were given.  On CT imaging, no acute findings were identified.  She does have diverticula which could be an alternative source of her recent bleeding.  While in the ED, patient did not have any further episodes of rectal bleeding.  She does feel comfortable with discharge home.  She was advised to return if she does have any worsening.  She was discharged in stable condition. I ordered medication including Percocet for analgesia; IVF for hydration Reevaluation of the patient after these medicines showed that the patient improved I have reviewed the patients home medicines and have made adjustments as needed   Social Determinants of Health:  Has access to outpatient care         Final Clinical Impression(s) / ED Diagnoses Final diagnoses:  Rectal bleeding    Rx / DC Orders ED Discharge Orders     None         Godfrey Pick, MD 08/18/22 1501

## 2022-08-18 NOTE — Discharge Instructions (Signed)
Your bleeding should resolve.  You can hold your Eliquis for 24 hours to help with this.  If you have any worsening, please return to the emergency department.  Currently, you do have internal hemorrhoids and diverticula that can both be causes of painless rectal bleeding.  You can minimize future episodes by maintaining daily soft stools.  Stool softeners and daily fiber supplements can help with this.  MiraLAX and Metamucil are common over-the-counter medications.

## 2022-08-18 NOTE — ED Triage Notes (Signed)
Pt came in due to bloody stools that started 08/16/22.  She reports they are "bright red."  Pt also reports abdominal pain.

## 2022-08-20 ENCOUNTER — Ambulatory Visit (HOSPITAL_COMMUNITY)
Admission: RE | Admit: 2022-08-20 | Discharge: 2022-08-20 | Disposition: A | Discharge status: Home / Self Care | Source: Ambulatory Visit | Attending: Cardiology | Admitting: Cardiology

## 2022-08-20 VITALS — BP 144/108 | HR 65 | Wt 288.2 lb

## 2022-08-20 DIAGNOSIS — I471 Supraventricular tachycardia, unspecified: Secondary | ICD-10-CM | POA: Diagnosis not present

## 2022-08-20 DIAGNOSIS — Z79899 Other long term (current) drug therapy: Secondary | ICD-10-CM | POA: Insufficient documentation

## 2022-08-20 DIAGNOSIS — Z7901 Long term (current) use of anticoagulants: Secondary | ICD-10-CM | POA: Insufficient documentation

## 2022-08-20 DIAGNOSIS — I4892 Unspecified atrial flutter: Secondary | ICD-10-CM | POA: Diagnosis not present

## 2022-08-20 DIAGNOSIS — E669 Obesity, unspecified: Secondary | ICD-10-CM | POA: Diagnosis not present

## 2022-08-20 DIAGNOSIS — I428 Other cardiomyopathies: Secondary | ICD-10-CM | POA: Diagnosis not present

## 2022-08-20 DIAGNOSIS — I110 Hypertensive heart disease with heart failure: Secondary | ICD-10-CM | POA: Insufficient documentation

## 2022-08-20 DIAGNOSIS — I5022 Chronic systolic (congestive) heart failure: Secondary | ICD-10-CM | POA: Diagnosis not present

## 2022-08-20 MED ORDER — ENTRESTO 97-103 MG PO TABS: 1.0000 | ORAL_TABLET | Freq: Two times a day (BID) | ORAL | 11 refills | Status: DC

## 2022-08-20 MED ORDER — CARVEDILOL 12.5 MG PO TABS: ORAL_TABLET | ORAL | 11 refills | Status: DC

## 2022-08-20 NOTE — Patient Instructions (Signed)
It was a pleasure seeing you today!  MEDICATIONS: -We are changing your medications today -Increase carvedilol to 12.5 mg twice daily -Call if you have questions about your medications.  LABS: -We will call you if your labs need attention.  NEXT APPOINTMENT: Return to clinic 3/29 with Dr. Aundra Dubin for echocardiogram.  In general, to take care of your heart failure: -Limit your fluid intake to 2 Liters (half-gallon) per day.   -Limit your salt intake to ideally 2-3 grams (2000-3000 mg) per day. -Weigh yourself daily and record, and bring that "weight diary" to your next appointment.  (Weight gain of 2-3 pounds in 1 day typically means fluid weight.) -The medications for your heart are to help your heart and help you live longer.   -Please contact us before stopping any of your heart medications.  Call the clinic at [phone] with questions or to reschedule future appointments.

## 2022-08-20 NOTE — Progress Notes (Signed)
Advanced Heart Failure Clinic Note   PCP: Dr. Alvis Lemmings, [facility] HF Cardiology: Dr. Shirlee Latch  HPI:    52 y.o. with history of HTN and nonischemic cardiomyopathy presents for followup of CHF.       She was diagnosed with CHF initially in 2011.  At that time, she was hospitalized with volume overload.  Coronary angiography in 2011 showed no significant CAD, but EF was about 25%.  BP was very high.  She was started on cardiac meds and BP improved.  Per her report, EF improved.  She eventually stopped all her medications.  She was then hospitalized in 05/2015 with CHF exacerbation. EF was 30-35% on 05/2015 echo and 30-35% on 08/2015 echo. Echo in 01/2016 showed EF up to 40%.    She stopped her medications again. She was then admitted in 09/2020 with atrial flutter/RVR and CHF.  Unsure how long atrial flutter was present as she did not feel palpitations.  She had TEE-guided DCCV. TEE showed EF 20-25% with severely decreased RV function, mild MR.  LHC in 09/2020 showed no significant CAD. Cardiac MRI in 12/2020 showed LV EF 31%, RVEF 52%, basal-mid lateral subendocardial LGE (>50% wall thickness), septal mid-wall LGE.  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 She was seen by Dr. Jomarie Longs, and this gene mutation was actually not thought to be the cause of DMD in her family (she likely has a mutation that was not picked up by the [employer] testing). In 01/2021, she had atrial flutter ablation.    Echo in 04/2021 showed EF 20-25% with moderate LV dilation, normal RV, normal IVC.    Patient went to the ER in 06/19/21 with SVT, possible atypical flutter with 2:1 block.  She was in the ER 07/11/21 with SVT rate >200, she was cardioverted by EMS and she was in NSR in the ER. Zio monitor in 07/2021 showed 8 runs NSVT, longest 19 beats and 23 runs SVT (?atrial tachycardia), longest 16 beats. No atrial fibrillation.    Follow up 07/2021 She felt carvedilol eliminated  palpitations better than metoprolol XL. She continued with chronic muscle aches with activity and BP on the low side. Hydralazine and Imdur were stopped.    Followed with Dr. Elberta Fortis 07/2021, discussed ICD but she is hesitant due to her keloids.   In 03/2022, she had both atypical atrial flutter and atrial fibrillation, she converted to NSR with amiodarone. EP planning flutter ablation in April.   Last seen by Dr. Shirlee Latch on 07/17/22. Palpitations improved on amiodarone.  She denied dyspnea and chest pain with her usual activities. Weight was up about 6 lbs, but no other symptoms. Entresto was increased and Lasix was decreased to every other day.  Today she returns to HF clinic for pharmacist medication titration. She reports overall feeling pretty good. Reports feeling somewhat sleepy and lethargic throughout the day, but gets slightly worse after taking medications in the AM. Reports she has felt this way since her last acute HF admission. Denies dizziness and lightheadedness. Rarely checks BP at home, but it is generally 120s/80. Denies chest pain and palpitations. Denies issues breathing. No SOB walking on flat surfaces, but has symptoms with walking fast or up hills. She attributes it to physical condition rather than fluid. Able to complete all ADLs. Activity level is minimal at baseline. Weight at home is generally 273-288 pounds. Takes furosemide 20 mg every other daily. No LEE, PND, or orthopnea.  Appetite is fair. Adheres to low-salt diet fairly well.  HF Medications: Carvedilol 6.25 mg twice daily Entresto 97/203 mg twice daily Spironolactone 25 mg daily Jardiance 10 mg daily Lasix 20 mg every other day  Has the patient been experiencing any side effects to the medications prescribed?  No  Does the patient have any problems obtaining medications due to transportation or finances?   Reports Delene Loll is too expensive (~$500) at [facility]. She [facility] since her  [employer] plan now prefers it.  Understanding of regimen: excellent Understanding of indications: excellent Potential of compliance:  Eliquis  Patient understands to avoid NSAIDs. Patient understands to avoid decongestants.    Pertinent Lab Values (08/18/22): Serum creatinine 1.12, BUN 9, Potassium 4.2, Sodium 137, Magnesium 1.9   Vital Signs: Weight: 288.2 lbs (last clinic weight: 289 lbs) Blood pressure: 144/108 mmHg  Heart rate: 65   Assessment/Plan: 1. Chronic systolic CHF: Nonischemic cardiomyopathy. No CAD on coronary angiography in 2011 and 09/2020.  She had 2 sons and a brother with Duchenne's muscular dystrophy.  She notes chronic muscle weakness with squat->stand.  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  TEE in 09/2020 showed EF 20-25% with severe RV dysfunction.  Cardiac MRI in 12/2020 showed LV EF 31%, RVEF 52%, basal-mid lateral subendocardial LGE (>50% wall thickness), septal mid-wall LGE.  LGE, especially in the inferolateral wall, is seen with DMD-related cardiomyopathy. Echo in 04/2021 showed EF 20-25%, moderate LV dilation, normal RV.   - NYHA class I, not volume overloaded. - Continue Lasix to 20 mg every other day.  - Increase carvedilol to 12.5 mg twice daily. HR stable.  - Continue Entresto 97/103 mg twice daily. BMP 07/28/22 stable after dose increase. Did not take medicatons at all before last visit with MD. Evelina Bucy meds 30 minutes before this visit today, so BP may not reflect home readings. CMP 08/18/22 showed mildly increased creatinine after increasing Entresto. Will monitor. - Continue spironolactone 25 mg daily. - Continue Jardiance 10 mg daily.   - Echo in March.  - Declined ICD at last visit. 2. HTN: Increase carvedilol as above. BP at last visit was elevated because she had not taken medications yet. BP today is similarly elevated because she took her  medications 30 minutes prior to the appointment. Has mild increase in fatigue after taking HF medications in the AM.  -Monitor BP at home, record reading, and bring to next appointment. 3. H/o hypothyroidism: No thyroid replacement. Previously following with endocrinology for goiter.  - Endocrinology appointment 03/19/23 4. Obesity: We discussed diet and exercise for weight loss again.   - Her insurance will not cover semaglutide for weight loss. 5. Atrial flutter: Typical flutter in 09/2020 with CHF decompensation.  Now s/p DCCV back to NSR and atrial flutter ablation.  - Atrial flutter ablation planned in April. 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 - - Management per primary 7. PVCs: Frequent on 02/2021 Zio monitor, 8% total. 5.7% on 07/2021 Zio monitor.  - She is now on amiodarone with PAF.  - Increase carvedilol as above.  8. SVT: Noted on Zio in 07/2021, ? atrial tachy.  9. Atrial fibrillation: Paroxysmal, noted in 03/2022 and converted to NSR with amiodarone (she was symptomatic in AF).  - Plan for ablation in 10/2022.  - Continue amiodarone 200 mg daily  for now.   - Continue Eliquis. She will need to continue this long-term.    Followup with Dr. Aundra Dubin in March for repeat echo.   Audry Riles, PharmD, BCPS, BCCP, CPP Heart Failure Clinic Pharmacist [phone]

## 2022-08-20 NOTE — Progress Notes (Deleted)
Advanced Heart Failure Clinic Note  PCP: Dr. Margarita Rana, [facility] HF Cardiology: Dr. Aundra Dubin  HPI:    52 y.o. with history of HTN and nonischemic cardiomyopathy presents for followup of CHF.       She was diagnosed with CHF initially in 2011.  At that time, she was hospitalized with volume overload.  Coronary angiography in 2011 showed no significant CAD, but EF was about 25%.  BP was very high.  She was started on cardiac meds and BP improved.  Per her report, EF improved.  She eventually stopped all her medications.  She was then hospitalized in 05/2015 with CHF exacerbation. EF was 30-35% on 05/2015 echo and 30-35% on 08/2015 echo. Echo in 01/2016 showed EF up to 40%.    She stopped her medications again. She was then admitted in 09/2020 with atrial flutter/RVR and CHF.  Unsure how long atrial flutter was present as she did not feel palpitations.  She had TEE-guided DCCV. TEE showed EF 20-25% with severely decreased RV function, mild MR.  LHC in 09/2020 showed no significant CAD. Cardiac MRI in 12/2020 showed LV EF 31%, RVEF 52%, basal-mid lateral subendocardial LGE (>50% wall thickness), septal mid-wall LGE.  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 She was seen by Dr. Broadus John, and this gene mutation was actually not thought to be the cause of DMD in her family (she likely has a mutation that was not picked up by the [employer] testing). In 01/2021, she had atrial flutter ablation.    Echo in 04/2021 showed EF 20-25% with moderate LV dilation, normal RV, normal IVC.    Patient went to the ER in 06/19/21 with SVT, possible atypical flutter with 2:1 block.  She was in the ER 07/11/21 with SVT rate >200, she was cardioverted by EMS and she was in NSR in the ER. Zio monitor in 07/2021 showed 8 runs NSVT, longest 19 beats and 23 runs SVT (?atrial tachycardia), longest 16 beats. No atrial fibrillation.    Follow up 07/2021 She felt Coreg eliminated  palpitations better than Toprol XL. She continued with chronic muscle ache with activity and BP on the low side. Hydralazine and Imdur were stopped.    Followed with Dr. Curt Bears 07/2021, discussed ICD but she is hesitant due to her keloids.   In 03/2022, she had both atypical atrial flutter and atrial fibrillation, she converted to NSR with amiodarone. EP planning flutter ablation in April.   Last seen by Dr. Aundra Dubin on 07/17/22. Palpitations improved on amiodarone.  She denied dyspnea and chest pain with her usual activities. Weight was up about 6 lbs, but no other symptoms. Entresto was increased and Lasix was decreased to every other day.  Today she returns to HF clinic for pharmacist medication titration. She reports overall feeling pretty good. Reports feeling somewhat sleepy and lethargic throughout the day, but gets slightly worse after taking medications in the AM. Reports she has felt this way since her last acute HF admission. Denies dizziness and lightheadedness. Rarely check BP at home, but is generally 120s/80. Denies chest pain and palpitations. Denies issues breathing. No SOB walking on flat surfaces, but has symptoms with walking fast or up hills. She attributes it to physical condition rather than fluid. Able to complete all ADLs. Activity level is minimal at baseline. Weight at home is generally 273-288 pounds. Takes furosemide 20 mg every other daily. No LEE, PND, or orthopnea. Appetite is  fair. Adheres fine to low-salt fairly well.  HF Medications: Carvedilol 6.25 mg twice daily Entresto 97/203 mg twice daily Spironolactone 25 mg daily Jardiance 10 mg daily Lasix 20 mg every other day  Has the patient been experiencing any side effects to the medications prescribed?  No  Does the patient have any problems obtaining medications due to transportation or finances?   Reports Delene Loll is too expensive (~$500) at [facility]. [facility] since her [employer]  plan now prefers it.  Understanding of regimen: excellent Understanding of indications: excellent Potential of compliance:  Eliquis  Patient understands to avoid NSAIDs. Patient understands to avoid decongestants.    Pertinent Lab Values (08/18/22): Serum creatinine 1.12, BUN 9, Potassium 4.2, Sodium 137, Magnesium 1.9   Vital Signs: Weight: 288.2 lbs (last clinic weight: 289 lbs) Blood pressure: 144/108 mmHg  Heart rate: 65   Assessment/Plan: 1. Chronic systolic CHF: Nonischemic cardiomyopathy. No CAD on coronary angiography in 2011 and 3/22.  She had 2 sons and a brother with Duchenne's muscular dystrophy.  She notes chronic muscle weakness with squat->stand.  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  TEE in 3/22 showed EF 20-25% with severe RV dysfunction.  Cardiac MRI in 6/22 showed LV EF 31%, RVEF 52%, basal-mid lateral subendocardial LGE (>50% wall thickness), septal mid-wall LGE.  LGE, especially in the inferolateral wall, is seen with DMD-related cardiomyopathy. Echo in 10/22 showed EF 20-25%, moderate LV dilation, normal RV.  NYHA class I, not volume overloaded. - Continue Lasix to 20 mg every other day.  - Increase Coreg to 12.5 mg twice daily. HR stable.  - Continue Entresto 97/103 twice daily. BMP 07/28/22 stable after dose increase. Did not take medicatons at all before last visit with MD. Evelina Bucy meds 30 minutes before this visit today, so BP may not reflect home readings. CMP 2/5 showed mildly increased creatinine after increasing Entresto. Will monitor. - Continue spironolactone 25 mg daily. - Continue Jardiance 10 mg daily.   - Echo in March.  - Declined ICD at last visit. 2. HTN: Increase carvedilol as above. BP at last visit was elevated because she has not taken medications yet. BP today is similarly elevated because she took her medications 30 minutes prior to  the appointment. Has mild increase in fatigue after taking HF medications in the AM.  -Monitor BP at home, record reading, and bring to next appointment. 3. H/o hypothyroidism: No thyroid replacement. Previously following with endocrinology for goiter.  - Endocrinology appointment 03/19/23 4. Obesity: We discussed diet and exercise for weight loss again.   - Her insurance will not cover semaglutide for weight loss. 5. Atrial flutter: Typical flutter in 3/22 with CHF decompensation.  Now s/p DCCV back to NSR and atrial flutter ablation.  - Atrial flutter ablation planned in April. 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 - - Management per primary 7. PVCs: Frequent on 8/22 Zio monitor, 8% total. 5.7% on 1/23 Zio monitor.  - She is now on amiodarone with PAF.  - Increase Coreg as above.  8. SVT: Noted on Zio in 1/23, ? atrial tachy.  9. Atrial fibrillation: Paroxysmal, noted in 03/2022 and converted to NSR with amiodarone (she was symptomatic in AF).  - Plan for ablation in 10/2022.  - Continue amiodarone 200 mg daily for now.   -  Continue Eliquis. She will need to continue this long-term.    Followup with Dr. Aundra Dubin in March for repeat echo.   Suzanne Long, PharmD, BCPS, BCCP, CPP Heart Failure Clinic Pharmacist [phone]

## 2022-09-16 ENCOUNTER — Encounter: Payer: Self-pay | Admitting: *Deleted

## 2022-09-16 ENCOUNTER — Telehealth: Payer: Self-pay | Admitting: *Deleted

## 2022-09-16 DIAGNOSIS — I480 Paroxysmal atrial fibrillation: Secondary | ICD-10-CM

## 2022-09-16 NOTE — Telephone Encounter (Signed)
Pt aware I will send CT and ablation instructions via mychart today. She understands we will go over this information at her 3/25 OV, along with getting non-fasting labs. Patient verbalized understanding and agreeable to plan.

## 2022-10-02 ENCOUNTER — Ambulatory Visit (HOSPITAL_COMMUNITY)
Admission: RE | Admit: 2022-10-02 | Discharge: 2022-10-02 | Disposition: A | Discharge status: Home / Self Care | Source: Ambulatory Visit | Attending: Cardiology | Admitting: Cardiology

## 2022-10-02 ENCOUNTER — Encounter (HOSPITAL_COMMUNITY): Payer: Self-pay | Admitting: Cardiology

## 2022-10-02 ENCOUNTER — Ambulatory Visit (HOSPITAL_BASED_OUTPATIENT_CLINIC_OR_DEPARTMENT_OTHER)
Admission: RE | Admit: 2022-10-02 | Discharge: 2022-10-02 | Disposition: A | Discharge status: Home / Self Care | Source: Ambulatory Visit | Attending: Cardiology | Admitting: Cardiology

## 2022-10-02 VITALS — BP 110/70 | HR 56 | Wt 279.6 lb

## 2022-10-02 DIAGNOSIS — E669 Obesity, unspecified: Secondary | ICD-10-CM | POA: Insufficient documentation

## 2022-10-02 DIAGNOSIS — I428 Other cardiomyopathies: Secondary | ICD-10-CM | POA: Insufficient documentation

## 2022-10-02 DIAGNOSIS — M6281 Muscle weakness (generalized): Secondary | ICD-10-CM | POA: Diagnosis not present

## 2022-10-02 DIAGNOSIS — Z6841 Body Mass Index (BMI) 40.0 and over, adult: Secondary | ICD-10-CM | POA: Diagnosis not present

## 2022-10-02 DIAGNOSIS — I480 Paroxysmal atrial fibrillation: Secondary | ICD-10-CM | POA: Insufficient documentation

## 2022-10-02 DIAGNOSIS — Z7901 Long term (current) use of anticoagulants: Secondary | ICD-10-CM | POA: Insufficient documentation

## 2022-10-02 DIAGNOSIS — E039 Hypothyroidism, unspecified: Secondary | ICD-10-CM | POA: Insufficient documentation

## 2022-10-02 DIAGNOSIS — I5042 Chronic combined systolic (congestive) and diastolic (congestive) heart failure: Secondary | ICD-10-CM | POA: Insufficient documentation

## 2022-10-02 DIAGNOSIS — I493 Ventricular premature depolarization: Secondary | ICD-10-CM | POA: Diagnosis not present

## 2022-10-02 DIAGNOSIS — Z7984 Long term (current) use of oral hypoglycemic drugs: Secondary | ICD-10-CM | POA: Insufficient documentation

## 2022-10-02 DIAGNOSIS — I483 Typical atrial flutter: Secondary | ICD-10-CM | POA: Insufficient documentation

## 2022-10-02 DIAGNOSIS — I10 Essential (primary) hypertension: Secondary | ICD-10-CM | POA: Diagnosis not present

## 2022-10-02 DIAGNOSIS — I5022 Chronic systolic (congestive) heart failure: Secondary | ICD-10-CM

## 2022-10-02 DIAGNOSIS — I110 Hypertensive heart disease with heart failure: Secondary | ICD-10-CM | POA: Diagnosis not present

## 2022-10-02 DIAGNOSIS — Z79899 Other long term (current) drug therapy: Secondary | ICD-10-CM | POA: Diagnosis not present

## 2022-10-02 LAB — CBC
HCT: 45.5 % (ref 36.0–46.0)
Hemoglobin: 15 g/dL (ref 12.0–15.0)
MCH: 29.9 pg (ref 26.0–34.0)
MCHC: 33 g/dL (ref 30.0–36.0)
MCV: 90.8 fL (ref 80.0–100.0)
Platelets: 275 10*3/uL (ref 150–400)
RBC: 5.01 MIL/uL (ref 3.87–5.11)
RDW: 13.3 % (ref 11.5–15.5)
WBC: 4.2 10*3/uL (ref 4.0–10.5)
nRBC: 0 % (ref 0.0–0.2)

## 2022-10-02 LAB — ECHOCARDIOGRAM COMPLETE
Area-P 1/2: 5.79 cm2
Calc EF: 44 %
S' Lateral: 5.4 cm
Single Plane A2C EF: 43.9 %
Single Plane A4C EF: 42.5 %

## 2022-10-02 LAB — COMPREHENSIVE METABOLIC PANEL
ALT: 23 U/L (ref 0–44)
AST: 25 U/L (ref 15–41)
Albumin: 3.7 g/dL (ref 3.5–5.0)
Alkaline Phosphatase: 96 U/L (ref 38–126)
Anion gap: 7 (ref 5–15)
BUN: 16 mg/dL (ref 6–20)
CO2: 27 mmol/L (ref 22–32)
Calcium: 9.8 mg/dL (ref 8.9–10.3)
Chloride: 104 mmol/L (ref 98–111)
Creatinine, Ser: 1.18 mg/dL — ABNORMAL HIGH (ref 0.44–1.00)
GFR, Estimated: 56 mL/min — ABNORMAL LOW (ref >60)
Glucose, Bld: 95 mg/dL (ref 70–99)
Potassium: 5 mmol/L (ref 3.5–5.1)
Sodium: 138 mmol/L (ref 135–145)
Total Bilirubin: 0.8 mg/dL (ref 0.3–1.2)
Total Protein: 7.3 g/dL (ref 6.5–8.1)

## 2022-10-02 LAB — TSH: TSH: 1.963 u[IU]/mL (ref 0.350–4.500)

## 2022-10-02 MED ORDER — ISOSORB DINITRATE-HYDRALAZINE 20-37.5 MG PO TABS: 0.5000 | ORAL_TABLET | Freq: Three times a day (TID) | ORAL | 3 refills | Status: DC

## 2022-10-02 MED ORDER — PERFLUTREN LIPID MICROSPHERE
1.0000 mL | INTRAVENOUS | Status: DC | PRN
  Administered 2022-10-02: 3 mL via INTRAVENOUS

## 2022-10-02 NOTE — Patient Instructions (Signed)
START Bidil 1/2 tab Three times a day  Labs done today, your results will be available in MyChart, we will contact you for abnormal readings.  Your physician recommends that you schedule a follow-up appointment in: 3 months  If you have any questions or concerns before your next appointment please send [place] a message through [place] or call our office at [phone].    TO LEAVE A MESSAGE FOR THE NURSE SELECT OPTION 2, PLEASE LEAVE A MESSAGE INCLUDING: YOUR NAME DATE OF BIRTH CALL BACK NUMBER REASON FOR CALL**this is important as we prioritize the call backs  YOU WILL RECEIVE A CALL BACK THE SAME DAY AS LONG AS YOU CALL BEFORE 4:00 PM  At the [facility], you and your health needs are our priority. As part of our continuing mission to provide you with exceptional heart care, we have created designated Provider Care Teams. These Care Teams include your primary Cardiologist (physician) and Advanced Practice Providers (APPs- Physician Assistants and Nurse Practitioners) who all work together to provide you with the care you need, when you need it.   You may see any of the following providers on your designated Care Team at your next follow up: Dr Glori Bickers Dr Loralie Champagne Dr. Roxana Hires, NP Lyda Jester, [place] [facility] [place], [place] Forestine Na, NP Audry Riles, PharmD   Please be sure to bring in all your medications bottles to every appointment.    Thank you for choosing [facility]

## 2022-10-02 NOTE — Progress Notes (Signed)
Patient ID: Suzanne Long, female   DOB: 1970/12/18, 52 y.o.   MRN: [id number] PCP: Dr. Margarita Rana, [facility] Cardiology: Dr. Aundra Dubin  52 y.o. with history of HTN and nonischemic cardiomyopathy presents for followup of CHF.      She was diagnosed with CHF initially in 2011.  At that time, she was hospitalized with volume overload.  Coronary angiography in 2011 showed no significant CAD, but EF was about 25%.  BP was very high.  She was started on cardiac meds and BP improved.  Per her report, EF improved.  She eventually stopped all her medications.  She was then hospitalized in 11/16 with CHF exacerbation. EF was 30-35% on 11/16 echo and 30-35% on 2/17 echo.   Echo in 7/17 showed EF up to 40%.   She stopped her medications again.  She was then admitted in 3/22 with atrial flutter/RVR and CHF.  Unsure how long atrial flutter was present as she did not feel palpitations.  She had TEE-guided DCCV.  TEE showed EF 20-25% with severely decreased RV function, mild MR.  LHC in 3/22 showed no significant CAD.  Cardiac MRI in 6/22 showed LV EF 31%, RVEF 52%, basal-mid lateral subendocardial LGE (>50% wall thickness), septal mid-wall LGE.  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 She was seen by Dr. Broadus John, and this gene mutation was actually not thought to be the cause of DMD in her family (she likely has a mutation that was not picked up by the [employer] testing).   In 7/22, she had atrial flutter ablation.   Echo in 10/22 showed EF 20-25% with moderate LV dilation, normal RV, normal IVC.   Patient went to the ER in 06/19/21 with SVT, ?atypical flutter with 2:1 block.  She was in the ER 07/11/21 with SVT rate >200, she was cardioverted by EMS and she was in NSR in the ER.  Zio monitor in 1/23 showed 8 runs NSVT, longest 19 beats and 23 runs SVT (?atrial tachycardia), longest 16 beats.  No atrial fibrillation.   Follow up 1/23 She felt Coreg eliminated  palpitations better than Toprol XL. She continued with chronic muscle ache with activity and BP on the low side. Hydralazine and Imdur were stopped.   Follow up with Dr. Curt Bears 1/23, discussed ICD but she is hesitant due to her keloids. Eliquis stopped.   In 9/23, she had both atypical atrial flutter and atrial fibrillation, she converted to NSR with amiodarone.   Echo was done today and reviewed, EF 25-30%, mild RV dysfunction.   Today she returns for HF follow up with her husband. She has had no further palpitations on amiodarone. Weight is down 10 lbs.  She has been doing to the gym where she walks on the treadmill, rides, exercise bike, and lifts weights.  No significant exertional dyspnea.  No chest pain.  No orthopnea/PND.  No lightheadedness.  She has had rectal bleeding in the past from hemorrhoids but no BRBPR/melena recently.   Labs (3/17): K 3.7, creatinine 0.84, SPEP negative, TSH normal.  Labs (5/17): K 3.9, creatinine 0.81, BNP normal Labs (3/22): K 5.2, creatinine 1.0 Labs (7/22): CK 1214 Labs (9/22): K 4.3, creatinine 1.04 Labs (12/22): TSH elevated at 6.6, free T4 normal, K 3.6, creatinine 1.14, BNP 161 Labs (1/23): K 4.3, creatinine 0.89 Labs (3/23): K 4.4, creatinine 1.07, BNP 87 Labs (6/23): K 4.1, creatinine 0.94 Labs (9/23): K 4, creatinine 1.13  ECG (personally reviewed): NSR, septal Qs, HR 55  PMH: 1. Chronic systolic CHF: 1st noted in 2011.  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  - LHC (2011) with no significant CAD, EF 25%.   - Echo (11/16) with EF 30-35%, diffuse hypokinesis. - Echo (2/17) with EF 30-35%, moderate LVH, diffuse hypokinesis.  - Echo (7/17) with EF 40%, severe LV dilation, grade II diastolic dysfunction.  - TEE (3/22) with EF 20-25% with severely decreased RV function, mild MR - LHC (3/22): No significant CAD.  -  Cardiac MRI (6/22): LV EF 31%, RVEF 52%, basal-mid lateral subendocardial LGE (>50% wall thickness), septal mid-wall LGE.   - Echo (10/22): EF 20-25% with moderate LV dilation, normal RV, normal IVC.  - Echo (3/24): EF 25-30%, mild RV dysfunction.  2. HTN 3. Hypothyroidism 4. Obesity.  5. Goiter 6. Atrial flutter: 3/22, s/p DCCV.  - Ablation in 7/22.  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  8. PVCs: Zio monitor (8/22) with 8% PVCs, no AF/AFL 9. SVT: noted in ER x 2 in 12/22, once possible atypical flutter and once cardioverted before reached ER.  - Zio (1/23): 8 runs NSVT, longest 19 beats and 23 runs SVT (?atrial tachycardia), longest 16 beats.  No atrial fibrillation.  10. Atrial fibrillation noted in 9/23.   SH:  Nonsmoker.  Rare ETOH.  No drugs.    FH: Mother, grandmother with HTN.  Uncle with MI. 2 sons with Duchenne muscular dystrophy (both have passed away).  Brother with Duchenne muscular dystrophy.  ROS: All systems reviewed and negative except as per HPI.   Current Outpatient Medications  Medication Sig Dispense Refill   amiodarone (PACERONE) 200 MG tablet Take 1 tablet (200 mg total) by mouth daily. 30 tablet 3   apixaban (ELIQUIS) 5 MG TABS tablet Take 1 tablet (5 mg total) by mouth 2 (two) times daily. 180 tablet 3   carvedilol (COREG) 12.5 MG tablet TAKE 1 TABLET BY MOUTH TWICE DAILY 60 tablet 11   empagliflozin (JARDIANCE) 10 MG TABS tablet Take 1 tablet (10 mg total) by mouth daily. 90 tablet 1   furosemide (LASIX) 20 MG tablet Take 1 tablet (20 mg total) by mouth every other day. 90 tablet 3   isosorbide-hydrALAZINE (BIDIL) 20-37.5 MG tablet Take 0.5 tablets by mouth 3 (three) times daily. 90 tablet 3   Multiple Vitamin (MULTIVITAMIN WITH MINERALS) TABS tablet Take 1 tablet by mouth daily. As needed     polyvinyl alcohol (LIQUIFILM TEARS) 1.4 % ophthalmic solution Place 1 drop into both eyes 3 (three) times daily as needed for dry eyes.      sacubitril-valsartan (ENTRESTO) 97-103 MG Take 1 tablet by mouth 2 (two) times daily. 60 tablet 11   spironolactone (ALDACTONE) 25 MG tablet Take 1 tablet (25 mg total) by mouth daily. 90 tablet 3   Vitamin D, Ergocalciferol, (DRISDOL) 1.25 MG (50000 UNIT) CAPS capsule TAKE 1 CAPSULE BY MOUTH EVERY 7 DAYS 8 capsule 0   No current facility-administered medications for this encounter.   Wt Readings from Last 3 Encounters:  10/02/22 126.8 kg (279 lb 9.6 oz)  08/20/22 130.7 kg (288 lb 3.2 oz)  08/18/22 127 kg (280 lb)   BP 110/70   Pulse (!) 56   Wt 126.8 kg (279 lb 9.6 oz)   SpO2 100%   BMI 42.51 kg/m  General: NAD, obese Neck: No JVD,  no thyromegaly or thyroid nodule.  Lungs: Clear to auscultation bilaterally with normal respiratory effort. CV: Nondisplaced PMI.  Heart regular S1/S2, no S3/S4, no murmur.  No peripheral edema.  No carotid bruit.  Normal pedal pulses.  Abdomen: Soft, nontender, no hepatosplenomegaly, no distention.  Skin: Intact without lesions or rashes.  Neurologic: Alert and oriented x 3.  Psych: Normal affect. Extremities: No clubbing or cyanosis.  HEENT: Normal.   Assessment/Plan: 1. Chronic systolic CHF: Nonischemic cardiomyopathy. No CAD on coronary angiography in 2011 and 3/22.  She had 2 sons and a brother with Duchenne's muscular dystrophy.  She notes chronic muscle weakness with squat->stand.  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  TEE in 3/22 showed EF 20-25% with severe RV dysfunction.  Cardiac MRI in 6/22 showed LV EF 31%, RVEF 52%, basal-mid lateral subendocardial LGE (>50% wall thickness), septal mid-wall LGE.  LGE, especially in the inferolateral wall, is seen with DMD-related cardiomyopathy. Echo in 10/22 showed EF 20-25%, moderate LV dilation, normal RV.  Echo today showed EF 25-30%, mild RV dysfunction.  NYHA class I, she is not  volume overloaded. - Continue Coreg 6.25 mg bid, will not increase with HR in 50s.  - Increase Entresto to 97/103 bid, BMET today.   - Continue Lasix 20 mg every other day.  - Continue spironolactone 25 mg daily. - Continue Jardiance 10 mg daily.   - Start Bidil 1/2 tab tid.  - She does not want to have an ICD placed though I have strongly recommended it given significant LGE present on cMRI.  She is very concerned about keloid formation, apparently very pruritic and bothersome when these have formed on her in the past.  We discussed this again today, she continues to decline. 2. HTN: BP controlled.  3. H/o hypothyroidism: No thyroid replacement. Previously following with endocrinology for goiter. Advised follow up. - Check TSH today.  4. Obesity: We discussed diet and exercise for weight loss again.  Body mass index is 42.51 kg/m. - She says that her PCP will start [facility].  - Important to lose weight so that heart transplant would be option for her in future if needed. 5. Atrial flutter: Typical flutter in 3/22 with CHF decompensation.  Now s/p DCCV back to NSR and atrial flutter ablation.  6. DMD heterozygote: I suspect this is the cause of her mild chronic muscle weakness. CPK has been mildly elevated.  7. PVCs: Frequent on 8/22 Zio monitor, 8% total. 5.7% on 1/23 Zio monitor.  - She is now on amiodarone with PAF.   - Continue Coreg as above.  8. SVT: Noted on Zio in 1/23, ? atrial tachy.  9. Atrial fibrillation: Paroxysmal, noted in 9/23 and converted to NSR with amiodarone (she was symptomatic in AF).  - Plan for atrial fibrillation ablation in 4/24.  - Continue amiodarone 200 mg daily for now.  Check LFTs and TSH today. She will need regular eye exam. Eventually after ablation, would like to stop amiodarone.  - Continue Eliquis.  She will need to continue this long-term. CBC today.   Followup in 3 months with APP.   Loralie Champagne 10/02/2022

## 2022-10-03 ENCOUNTER — Other Ambulatory Visit (HOSPITAL_COMMUNITY)

## 2022-10-03 ENCOUNTER — Encounter (HOSPITAL_COMMUNITY): Admitting: Cardiology

## 2022-10-06 ENCOUNTER — Telehealth (HOSPITAL_COMMUNITY): Payer: Self-pay

## 2022-10-06 ENCOUNTER — Encounter: Payer: Self-pay | Admitting: Cardiology

## 2022-10-06 ENCOUNTER — Ambulatory Visit: Attending: Cardiology | Admitting: Cardiology

## 2022-10-06 VITALS — BP 118/80 | HR 54 | Ht 68.0 in | Wt 280.0 lb

## 2022-10-06 DIAGNOSIS — I5022 Chronic systolic (congestive) heart failure: Secondary | ICD-10-CM | POA: Diagnosis not present

## 2022-10-06 DIAGNOSIS — D6869 Other thrombophilia: Secondary | ICD-10-CM | POA: Diagnosis not present

## 2022-10-06 DIAGNOSIS — I4819 Other persistent atrial fibrillation: Secondary | ICD-10-CM

## 2022-10-06 MED ORDER — PREDNISONE 50 MG PO TABS: ORAL_TABLET | ORAL | 0 refills | Status: DC

## 2022-10-06 NOTE — H&P (View-Only) (Signed)
 Electrophysiology Office Note   Date:  10/06/2022   ID:  Suzanne Long, DOB 05/09/1971, MRN [id number]  PCP:  Bland, Veita, MD  Cardiologist:  [place] Primary Electrophysiologist:  Tesa Meadors Martin Seher Schlagel, MD    Chief Complaint: Atrial flutter   History of Present Illness: Suzanne Long is a 52 y.o. female who is being seen today for the evaluation of atrial flutter at the request of No ref. provider found. Presenting today for electrophysiology evaluation.  She has a history significant for chronic systolic heart failure due to nonischemic cardiomyopathy, hypertension, obesity, atrial flutter, PVCs.  She had atrial flutter ablation 01/22/2021.  She wore a cardiac monitor that showed a 5% PVC burden.  She presented there is here 03/29/2022 with palpitations and shortness of breath.  She was found to be in a wide-complex tachycardia.  She was started on amiodarone which converted her to 2-1 atrial flutter.  She then converted to sinus rhythm into atrial fibrillation.  She was discharged on amiodarone  Today, denies symptoms of palpitations, chest pain, shortness of breath, orthopnea, PND, lower extremity edema, claudication, dizziness, presyncope, syncope, bleeding, or neurologic sequela. The patient is tolerating medications without difficulties.  Today she is feeling well.  She has plans for atrial fibrillation ablation 10/21/22.     Past Medical History:  Diagnosis Date   CHF (congestive heart failure) (HCC)    Dyspnea    Hypertension    Hypothyroidism    Joint pain    Multiple food allergies    NICM (nonischemic cardiomyopathy) (HCC)    a. Echo 2/17:  moderate LVH, EF 30-35%, diffuse HK, moderate LAE   NICM (nonischemic cardiomyopathy) (HCC) 09/26/2020   Non-ST elevation (NSTEMI) myocardial infarction (HCC) 09/26/2020   Past Surgical History:  Procedure Laterality Date   A-FLUTTER ABLATION N/A 01/22/2021   Procedure: A-FLUTTER ABLATION;  Surgeon: Brandonlee Navis Martin, MD;   [facility];  Service: Cardiovascular;  Laterality: N/A;   BUBBLE STUDY  09/26/2020   Procedure: BUBBLE STUDY;  Surgeon: Pemberton, Heather E, MD;  Location: [facility];  Service: Cardiovascular;;   CARDIOVERSION N/A 09/26/2020   Procedure: CARDIOVERSION;  Surgeon: Pemberton, Heather E, MD;  [facility];  Service: Cardiovascular;  Laterality: N/A;   LEFT HEART CATH AND CORONARY ANGIOGRAPHY N/A 09/24/2020   Procedure: LEFT HEART CATH AND CORONARY ANGIOGRAPHY;  Surgeon: Berry, Jonathan J, MD;  [facility];  Service: Cardiovascular;  Laterality: N/A;   MYOMECTOMY     None     TEE WITHOUT CARDIOVERSION N/A 09/26/2020   Procedure: TRANSESOPHAGEAL ECHOCARDIOGRAM (TEE);  Surgeon: Pemberton, Heather E, MD;  Location[facility];  Service: Cardiovascular;  Laterality: N/A;     Current Outpatient Medications  Medication Sig Dispense Refill   amiodarone (PACERONE) 200 MG tablet Take 1 tablet (200 mg total) by mouth daily. 30 tablet 3   apixaban (ELIQUIS) 5 MG TABS tablet Take 1 tablet (5 mg total) by mouth 2 (two) times daily. 180 tablet 3   carvedilol (COREG) 12.5 MG tablet TAKE 1 TABLET BY MOUTH TWICE DAILY 60 tablet 11   empagliflozin (JARDIANCE) 10 MG TABS tablet Take 1 tablet (10 mg total) by mouth daily. 90 tablet 1   furosemide (LASIX) 20 MG tablet Take 1 tablet (20 mg total) by mouth every other day. 90 tablet 3   isosorbide-hydrALAZINE (BIDIL) 20-37.5 MG tablet Take 0.5 tablets by mouth 3 (three) times daily. 90 tablet 3   Multiple Vitamin (MULTIVITAMIN WITH MINERALS) TABS   tablet Take 1 tablet by mouth daily. As needed     polyvinyl alcohol (LIQUIFILM TEARS) 1.4 % ophthalmic solution Place 1 drop into both eyes 3 (three) times daily as needed for dry eyes.     predniSONE (DELTASONE) 50 MG tablet Take 1 tablet (50 mg total) 13 hours prior, 7 hours prior and 1 hour prior to CT testing 3 tablet 0   sacubitril-valsartan (ENTRESTO) 97-103 MG Take 1  tablet by mouth 2 (two) times daily. 60 tablet 11   spironolactone (ALDACTONE) 25 MG tablet Take 1 tablet (25 mg total) by mouth daily. 90 tablet 3   Vitamin D, Ergocalciferol, (DRISDOL) 1.25 MG (50000 UNIT) CAPS capsule TAKE 1 CAPSULE BY MOUTH EVERY 7 DAYS 8 capsule 0   No current facility-administered medications for this visit.    Allergies:   Shellfish allergy, Gadolinium derivatives, Iodine, and Latex   Social History:  The patient  reports that she has never smoked. She has never used smokeless tobacco. She reports current alcohol use of about 2.0 - 3.0 standard drinks of alcohol per week. She reports that she does not use drugs.   Family History:  The patient's family history includes Depression in her mother; Diabetes in her mother; Hypertension in her mother; Kidney failure in her brother; Muscular dystrophy in her brother and son; Obesity in her mother.   ROS:  Please see the history of present illness.   Otherwise, review of systems is positive for none.   All other systems are reviewed and negative.   PHYSICAL EXAM: VS:  BP 118/80   Pulse (!) 54   Ht 5' 8" (1.727 m)   Wt 280 lb (127 kg)   SpO2 98%   BMI 42.57 kg/m  , BMI Body mass index is 42.57 kg/m. GEN: Well nourished, well developed, in no acute distress  HEENT: normal  Neck: no JVD, carotid bruits, or masses Cardiac: RRR; no murmurs, rubs, or gallops,no edema  Respiratory:  clear to auscultation bilaterally, normal work of breathing GI: soft, nontender, nondistended, + BS MS: no deformity or atrophy  Skin: warm and dry Neuro:  Strength and sensation are intact Psych: euthymic mood, full affect  EKG:  EKG is not ordered today. Personal review of the ekg ordered 10/02/22 shows sinus rhythm  Recent Labs: 08/18/2022: Magnesium 1.9 10/02/2022: ALT 23; BUN 16; Creatinine, Ser 1.18; Hemoglobin 15.0; Platelets 275; Potassium 5.0; Sodium 138; TSH 1.963    Lipid Panel     Component Value Date/Time   CHOL 170  09/23/2020 0433   CHOL 175 05/31/2019 1140   TRIG 80 09/23/2020 0433   HDL 45 09/23/2020 0433   HDL 51 05/31/2019 1140   CHOLHDL 3.8 09/23/2020 0433   VLDL 16 09/23/2020 0433   LDLCALC 109 (H) 09/23/2020 0433   LDLCALC 112 (H) 05/31/2019 1140     Wt Readings from Last 3 Encounters:  10/06/22 280 lb (127 kg)  10/02/22 279 lb 9.6 oz (126.8 kg)  08/20/22 288 lb 3.2 oz (130.7 kg)      Other studies Reviewed: Additional studies/ records that were reviewed today include: Left heart catheterization 09/24/2020 Review of the above records today demonstrates:  There is severe left ventricular systolic dysfunction. LV end diastolic pressure is mildly elevated. The left ventricular ejection fraction is less than 25% by visual estimate.  Cardiac monitor 08/02/2021 personally reviewed 1. Predominantly NSR 2. 8 runs NSVT, longest 19 beats. 3. 23 runs SVT, longest 16 beats (?atrial tachycardia) 4. 5.7% PVCs    TTE 04/29/2021  1. Left ventricular ejection fraction, by estimation, is 20 to 25%. The  left ventricle has severely decreased function. The left ventricle  demonstrates global hypokinesis. The left ventricular internal cavity size  was moderately dilated. Left  ventricular diastolic parameters are consistent with Grade I diastolic  dysfunction (impaired relaxation). The average left ventricular global  longitudinal strain is -13.7 %. The global longitudinal strain is  abnormal.   2. Right ventricular systolic function is normal. The right ventricular  size is normal. Tricuspid regurgitation signal is inadequate for assessing  PA pressure.   3. The mitral valve is normal in structure. Trivial mitral valve  regurgitation. No evidence of mitral stenosis.   4. The aortic valve is tricuspid. Aortic valve regurgitation is not  visualized. No aortic stenosis is present.   5. The inferior vena cava is normal in size with greater than 50%  respiratory variability, suggesting right atrial  pressure of 3 mmHg.   ASSESSMENT AND PLAN:  1.  Typical atrial flutter: Status post ablation 01/22/2021.  No obvious recurrence.  2.  Atrial fibrillation/atypical atrial flutter: CHA2DS2-VASc of 3.  Currently on amiodarone carvedilol, Eliquis.  His plan for ablation 10/21/22.  All questions were answered today.  Plan for CT scan prior to ablation.  3.  Second hypercoagulable state: Currently on Eliquis for atrial fibrillation  4.  Chronic systolic heart failure: Due to nonischemic cardiomyopathy.  Currently on Lasix, Toprol-XL, Entresto, Aldactone, Jardiance.  Plan per primary cardiology.  5.  Hypertension: Currently well-controlled  Current medicines are reviewed at length with the patient today.   The patient does not have concerns regarding her medicines.  The following changes were made today: None  Labs/ tests ordered today include:  No orders of the defined types were placed in this encounter.     Disposition:   FU 3 months  Signed, Deveney Bayon Martin Kinzley Savell, MD  10/06/2022 1:34 PM     [facility] [address] [place] [address] [phone] (office) [phone] (fax) 

## 2022-10-06 NOTE — Progress Notes (Signed)
Electrophysiology Office Note   Date:  99991111   ID:  Suzanne Long, DOB [id number], MRN [id number]  PCP:  Lucianne Lei, MD  Cardiologist:  Aundra Dubin Primary Electrophysiologist:  Yordin Rhoda Meredith Leeds, MD    Chief Complaint: Atrial flutter   History of Present Illness: Suzanne Long is a 52 y.o. female who is being seen today for the evaluation of atrial flutter at the request of No ref. provider found. Presenting today for electrophysiology evaluation.  She has a history significant for chronic systolic heart failure due to nonischemic cardiomyopathy, hypertension, obesity, atrial flutter, PVCs.  She had atrial flutter ablation 01/22/2021.  She wore a cardiac monitor that showed a 5% PVC burden.  She presented there is here 03/29/2022 with palpitations and shortness of breath.  She was found to be in a wide-complex tachycardia.  She was started on amiodarone which converted her to 2-1 atrial flutter.  She then converted to sinus rhythm into atrial fibrillation.  She was discharged on amiodarone  Today, denies symptoms of palpitations, chest pain, shortness of breath, orthopnea, PND, lower extremity edema, claudication, dizziness, presyncope, syncope, bleeding, or neurologic sequela. The patient is tolerating medications without difficulties.  Today she is feeling well.  She has plans for atrial fibrillation ablation 10/21/22.     Past Medical History:  Diagnosis Date   CHF (congestive heart failure) (HCC)    Dyspnea    Hypertension    Hypothyroidism    Joint pain    Multiple food allergies    NICM (nonischemic cardiomyopathy) ([place])    a. Echo 2/17:  moderate LVH, EF 30-35%, diffuse HK, moderate LAE   NICM (nonischemic cardiomyopathy) ([place]) 09/26/2020   Non-ST elevation (NSTEMI) myocardial infarction ([place]) 09/26/2020   Past Surgical History:  Procedure Laterality Date   A-FLUTTER ABLATION N/A 01/22/2021   Procedure: A-FLUTTER ABLATION;  Surgeon: Constance Haw, MD;   Location: [place] CV [facility];  Service: Cardiovascular;  Laterality: N/A;   BUBBLE STUDY  09/26/2020   Procedure: BUBBLE STUDY;  Surgeon: Freada Bergeron, MD;  Location: [place];  Service: Cardiovascular;;   CARDIOVERSION N/A 09/26/2020   Procedure: CARDIOVERSION;  Surgeon: Freada Bergeron, MD;  Location: [place];  Service: Cardiovascular;  Laterality: N/A;   LEFT HEART CATH AND CORONARY ANGIOGRAPHY N/A 09/24/2020   Procedure: LEFT HEART CATH AND CORONARY ANGIOGRAPHY;  Surgeon: Lorretta Harp, MD;  Location: [place] CV LAB;  Service: Cardiovascular;  Laterality: N/A;   MYOMECTOMY     None     TEE WITHOUT CARDIOVERSION N/A 09/26/2020   Procedure: TRANSESOPHAGEAL ECHOCARDIOGRAM (TEE);  Surgeon: Freada Bergeron, MD;  [facility];  Service: Cardiovascular;  Laterality: N/A;     Current Outpatient Medications  Medication Sig Dispense Refill   amiodarone (PACERONE) 200 MG tablet Take 1 tablet (200 mg total) by mouth daily. 30 tablet 3   apixaban (ELIQUIS) 5 MG TABS tablet Take 1 tablet (5 mg total) by mouth 2 (two) times daily. 180 tablet 3   carvedilol (COREG) 12.5 MG tablet TAKE 1 TABLET BY MOUTH TWICE DAILY 60 tablet 11   empagliflozin (JARDIANCE) 10 MG TABS tablet Take 1 tablet (10 mg total) by mouth daily. 90 tablet 1   furosemide (LASIX) 20 MG tablet Take 1 tablet (20 mg total) by mouth every other day. 90 tablet 3   isosorbide-hydrALAZINE (BIDIL) 20-37.5 MG tablet Take 0.5 tablets by mouth 3 (three) times daily. 90 tablet 3   Multiple Vitamin (MULTIVITAMIN WITH MINERALS) TABS  tablet Take 1 tablet by mouth daily. As needed     polyvinyl alcohol (LIQUIFILM TEARS) 1.4 % ophthalmic solution Place 1 drop into both eyes 3 (three) times daily as needed for dry eyes.     predniSONE (DELTASONE) 50 MG tablet Take 1 tablet (50 mg total) 13 hours prior, 7 hours prior and 1 hour prior to CT testing 3 tablet 0   sacubitril-valsartan (ENTRESTO) 97-103 MG Take 1  tablet by mouth 2 (two) times daily. 60 tablet 11   spironolactone (ALDACTONE) 25 MG tablet Take 1 tablet (25 mg total) by mouth daily. 90 tablet 3   Vitamin D, Ergocalciferol, (DRISDOL) 1.25 MG (50000 UNIT) CAPS capsule TAKE 1 CAPSULE BY MOUTH EVERY 7 DAYS 8 capsule 0   No current facility-administered medications for this visit.    Allergies:   Shellfish allergy, Gadolinium derivatives, Iodine, and Latex   Social History:  The patient  reports that she has never smoked. She has never used smokeless tobacco. She reports current alcohol use of about 2.0 - 3.0 standard drinks of alcohol per week. She reports that she does not use drugs.   Family History:  The patient's family history includes Depression in her mother; Diabetes in her mother; Hypertension in her mother; Kidney failure in her brother; Muscular dystrophy in her brother and son; Obesity in her mother.   ROS:  Please see the history of present illness.   Otherwise, review of systems is positive for none.   All other systems are reviewed and negative.   PHYSICAL EXAM: VS:  BP 118/80   Pulse (!) 54   Ht 5\' 8"  (1.727 m)   Wt 280 lb (127 kg)   SpO2 98%   BMI 42.57 kg/m  , BMI Body mass index is 42.57 kg/m. GEN: Well nourished, well developed, in no acute distress  HEENT: normal  Neck: no JVD, carotid bruits, or masses Cardiac: RRR; no murmurs, rubs, or gallops,no edema  Respiratory:  clear to auscultation bilaterally, normal work of breathing GI: soft, nontender, nondistended, + BS MS: no deformity or atrophy  Skin: warm and dry Neuro:  Strength and sensation are intact Psych: euthymic mood, full affect  EKG:  EKG is not ordered today. Personal review of the ekg ordered 10/02/22 shows sinus rhythm  Recent Labs: 08/18/2022: Magnesium 1.9 10/02/2022: ALT 23; BUN 16; Creatinine, Ser 1.18; Hemoglobin 15.0; Platelets 275; Potassium 5.0; Sodium 138; TSH 1.963    Lipid Panel     Component Value Date/Time   CHOL 170  09/23/2020 0433   CHOL 175 05/31/2019 1140   TRIG 80 09/23/2020 0433   HDL 45 09/23/2020 0433   HDL 51 05/31/2019 1140   CHOLHDL 3.8 09/23/2020 0433   VLDL 16 09/23/2020 0433   LDLCALC 109 (H) 09/23/2020 0433   LDLCALC 112 (H) 05/31/2019 1140     Wt Readings from Last 3 Encounters:  10/06/22 280 lb (127 kg)  10/02/22 279 lb 9.6 oz (126.8 kg)  08/20/22 288 lb 3.2 oz (130.7 kg)      Other studies Reviewed: Additional studies/ records that were reviewed today include: Left heart catheterization 09/24/2020 Review of the above records today demonstrates:  There is severe left ventricular systolic dysfunction. LV end diastolic pressure is mildly elevated. The left ventricular ejection fraction is less than 25% by visual estimate.  Cardiac monitor 08/02/2021 personally reviewed 1. Predominantly NSR 2. 8 runs NSVT, longest 19 beats. 3. 23 runs SVT, longest 16 beats (?atrial tachycardia) 4. 5.7% PVCs  TTE 04/29/2021  1. Left ventricular ejection fraction, by estimation, is 20 to 25%. The  left ventricle has severely decreased function. The left ventricle  demonstrates global hypokinesis. The left ventricular internal cavity size  was moderately dilated. Left  ventricular diastolic parameters are consistent with Grade I diastolic  dysfunction (impaired relaxation). The average left ventricular global  longitudinal strain is -13.7 %. The global longitudinal strain is  abnormal.   2. Right ventricular systolic function is normal. The right ventricular  size is normal. Tricuspid regurgitation signal is inadequate for assessing  PA pressure.   3. The mitral valve is normal in structure. Trivial mitral valve  regurgitation. No evidence of mitral stenosis.   4. The aortic valve is tricuspid. Aortic valve regurgitation is not  visualized. No aortic stenosis is present.   5. The inferior vena cava is normal in size with greater than 50%  respiratory variability, suggesting right atrial  pressure of 3 mmHg.   ASSESSMENT AND PLAN:  1.  Typical atrial flutter: Status post ablation 01/22/2021.  No obvious recurrence.  2.  Atrial fibrillation/atypical atrial flutter: CHA2DS2-VASc of 3.  Currently on amiodarone carvedilol, Eliquis.  His plan for ablation 10/21/22.  All questions were answered today.  Plan for CT scan prior to ablation.  3.  Second hypercoagulable state: Currently on Eliquis for atrial fibrillation  4.  Chronic systolic heart failure: Due to nonischemic cardiomyopathy.  Currently on Lasix, Toprol-XL, Entresto, Aldactone, Jardiance.  Plan per primary cardiology.  5.  Hypertension: Currently well-controlled  Current medicines are reviewed at length with the patient today.   The patient does not have concerns regarding her medicines.  The following changes were made today: None  Labs/ tests ordered today include:  No orders of the defined types were placed in this encounter.     Disposition:   FU 3 months  Signed, Jammy Plotkin Meredith Leeds, MD  10/06/2022 1:34 PM     [place] [address] [phone] (office) [phone] (fax)

## 2022-10-06 NOTE — Telephone Encounter (Signed)
Patient aware of labs.  

## 2022-10-06 NOTE — Patient Instructions (Signed)
Medication Instructions:  Your physician recommends that you continue on your current medications as directed. Please refer to the Current Medication list given to you today.  *If you need a refill on your cardiac medications before your next appointment, please call your pharmacy*   Lab Work: None ordered   Testing/Procedures: You are scheduled for an AFib ablation on 10/21/22   Follow-Up: At [facility], you and your health needs are our priority.  As part of our continuing mission to provide you with exceptional heart care, we have created designated Provider Care Teams.  These Care Teams include your primary Cardiologist (physician) and Advanced Practice Providers (APPs -  Physician Assistants and Nurse Practitioners) who all work together to provide you with the care you need, when you need it.  Your next appointment:   Keep your scheduled post  procedure follow up  The format for your next appointment:   In Person  Provider:   Allegra Lai, MD    Thank you for choosing [facility]!!   Trinidad Curet, RN [phone]

## 2022-10-13 ENCOUNTER — Telehealth (HOSPITAL_COMMUNITY): Payer: Self-pay | Admitting: Emergency Medicine

## 2022-10-13 ENCOUNTER — Other Ambulatory Visit: Payer: Self-pay

## 2022-10-13 MED ORDER — AMIODARONE HCL 200 MG PO TABS: 200.0000 mg | ORAL_TABLET | Freq: Every day | ORAL | 3 refills | Status: DC

## 2022-10-13 NOTE — Telephone Encounter (Signed)
Attempted to call patient regarding upcoming cardiac CT appointment. °Left message on voicemail with name and callback number °Johnatan Baskette RN Navigator Cardiac Imaging °[facility] °[phone] Office °[phone] Cell ° °

## 2022-10-13 NOTE — Telephone Encounter (Signed)
Reaching out to patient to offer assistance regarding upcoming cardiac imaging study; pt verbalizes understanding of appt date/time, parking situation and where to check in, pre-test NPO status and medications ordered, and verified current allergies; name and call back number provided for further questions should they arise Marchia Bond RN Navigator Cardiac Imaging [facility] [phone] office [phone] cell  Arrival 1030  Reviewed 13 hr prep times Denies iv issues Aware contrast

## 2022-10-14 ENCOUNTER — Ambulatory Visit (HOSPITAL_COMMUNITY)
Admission: RE | Admit: 2022-10-14 | Discharge: 2022-10-14 | Disposition: A | Discharge status: Home / Self Care | Source: Ambulatory Visit | Attending: Cardiology | Admitting: Cardiology

## 2022-10-14 DIAGNOSIS — I480 Paroxysmal atrial fibrillation: Secondary | ICD-10-CM | POA: Insufficient documentation

## 2022-10-14 MED ORDER — IOHEXOL 350 MG/ML SOLN
100.0000 mL | Freq: Once | INTRAVENOUS | Status: AC | PRN
  Administered 2022-10-14: 100 mL via INTRAVENOUS

## 2022-10-14 MED ORDER — NITROGLYCERIN 0.4 MG SL SUBL
SUBLINGUAL_TABLET | SUBLINGUAL | Status: AC
  Filled 2022-10-14: qty 2

## 2022-10-20 NOTE — Pre-Procedure Instructions (Signed)
Instructed patient on the following items: Arrival time 0515 Nothing to eat or drink after midnight No meds AM of procedure Responsible person to drive you home and stay with you for 24 hrs  Have you missed any doses of anti-coagulant Eliquis- takes twice a day, hasn't missed any doses, don't take dose in the morning

## 2022-10-21 ENCOUNTER — Ambulatory Visit (HOSPITAL_COMMUNITY)
Admission: RE | Disposition: A | Discharge status: Home / Self Care | Payer: Self-pay | Source: Home / Self Care | Attending: Cardiology

## 2022-10-21 ENCOUNTER — Ambulatory Visit (HOSPITAL_COMMUNITY): Admitting: Anesthesiology

## 2022-10-21 ENCOUNTER — Encounter (HOSPITAL_COMMUNITY): Payer: Self-pay | Admitting: Cardiology

## 2022-10-21 ENCOUNTER — Ambulatory Visit (HOSPITAL_BASED_OUTPATIENT_CLINIC_OR_DEPARTMENT_OTHER): Admitting: Anesthesiology

## 2022-10-21 ENCOUNTER — Ambulatory Visit (HOSPITAL_COMMUNITY)
Admission: RE | Admit: 2022-10-21 | Discharge: 2022-10-21 | Disposition: A | Discharge status: Home / Self Care | Attending: Cardiology | Admitting: Cardiology

## 2022-10-21 DIAGNOSIS — I428 Other cardiomyopathies: Secondary | ICD-10-CM | POA: Insufficient documentation

## 2022-10-21 DIAGNOSIS — I4891 Unspecified atrial fibrillation: Secondary | ICD-10-CM | POA: Diagnosis not present

## 2022-10-21 DIAGNOSIS — I110 Hypertensive heart disease with heart failure: Secondary | ICD-10-CM

## 2022-10-21 DIAGNOSIS — D6859 Other primary thrombophilia: Secondary | ICD-10-CM | POA: Diagnosis not present

## 2022-10-21 DIAGNOSIS — I5022 Chronic systolic (congestive) heart failure: Secondary | ICD-10-CM | POA: Insufficient documentation

## 2022-10-21 DIAGNOSIS — Z7901 Long term (current) use of anticoagulants: Secondary | ICD-10-CM | POA: Diagnosis not present

## 2022-10-21 DIAGNOSIS — E039 Hypothyroidism, unspecified: Secondary | ICD-10-CM

## 2022-10-21 DIAGNOSIS — I4819 Other persistent atrial fibrillation: Secondary | ICD-10-CM | POA: Diagnosis present

## 2022-10-21 DIAGNOSIS — I484 Atypical atrial flutter: Secondary | ICD-10-CM | POA: Insufficient documentation

## 2022-10-21 DIAGNOSIS — I483 Typical atrial flutter: Secondary | ICD-10-CM

## 2022-10-21 DIAGNOSIS — Z79899 Other long term (current) drug therapy: Secondary | ICD-10-CM | POA: Insufficient documentation

## 2022-10-21 DIAGNOSIS — I509 Heart failure, unspecified: Secondary | ICD-10-CM | POA: Diagnosis not present

## 2022-10-21 HISTORY — PX: ATRIAL FIBRILLATION ABLATION: EP1191

## 2022-10-21 LAB — POCT ACTIVATED CLOTTING TIME
Activated Clotting Time: 320 seconds
Activated Clotting Time: 336 seconds

## 2022-10-21 LAB — PREGNANCY, URINE: Preg Test, Ur: NEGATIVE

## 2022-10-21 SURGERY — ATRIAL FIBRILLATION ABLATION
Anesthesia: General

## 2022-10-21 MED ORDER — ONDANSETRON HCL 4 MG/2ML IJ SOLN: 4.0000 mg | Freq: Four times a day (QID) | INTRAMUSCULAR | Status: DC | PRN

## 2022-10-21 MED ORDER — HEPARIN SODIUM (PORCINE) 1000 UNIT/ML IJ SOLN
INTRAMUSCULAR | Status: DC | PRN
  Administered 2022-10-21: 15000 [IU] via INTRAVENOUS
  Administered 2022-10-21 (×2): 2000 [IU] via INTRAVENOUS

## 2022-10-21 MED ORDER — PHENYLEPHRINE HCL-NACL 20-0.9 MG/250ML-% IV SOLN
0.0000 ug/min | INTRAVENOUS | Status: DC
  Administered 2022-10-21: 50 ug/min via INTRAVENOUS
  Filled 2022-10-21: qty 250

## 2022-10-21 MED ORDER — DIPHENHYDRAMINE HCL 50 MG/ML IJ SOLN
INTRAMUSCULAR | Status: AC
  Administered 2022-10-21: 25 mg via INTRAVENOUS
  Filled 2022-10-21: qty 1

## 2022-10-21 MED ORDER — SUGAMMADEX SODIUM 200 MG/2ML IV SOLN
INTRAVENOUS | Status: DC | PRN
  Administered 2022-10-21: 200 mg via INTRAVENOUS

## 2022-10-21 MED ORDER — FENTANYL CITRATE (PF) 250 MCG/5ML IJ SOLN
INTRAMUSCULAR | Status: DC | PRN
  Administered 2022-10-21: 50 ug via INTRAVENOUS

## 2022-10-21 MED ORDER — PHENYLEPHRINE HCL-NACL 20-0.9 MG/250ML-% IV SOLN
INTRAVENOUS | Status: DC | PRN
  Administered 2022-10-21: 30 ug/min via INTRAVENOUS

## 2022-10-21 MED ORDER — HEPARIN (PORCINE) IN NACL 1000-0.9 UT/500ML-% IV SOLN
INTRAVENOUS | Status: DC | PRN
  Administered 2022-10-21 (×4): 500 mL

## 2022-10-21 MED ORDER — PROTAMINE SULFATE 10 MG/ML IV SOLN
INTRAVENOUS | Status: DC | PRN
  Administered 2022-10-21: 40 mg via INTRAVENOUS

## 2022-10-21 MED ORDER — HEPARIN SODIUM (PORCINE) 1000 UNIT/ML IJ SOLN
INTRAMUSCULAR | Status: AC
  Filled 2022-10-21: qty 10

## 2022-10-21 MED ORDER — LIDOCAINE 2% (20 MG/ML) 5 ML SYRINGE
INTRAMUSCULAR | Status: DC | PRN
  Administered 2022-10-21: 20 mg via INTRAVENOUS

## 2022-10-21 MED ORDER — SODIUM CHLORIDE 0.9% FLUSH: 3.0000 mL | Freq: Two times a day (BID) | INTRAVENOUS | Status: DC

## 2022-10-21 MED ORDER — HEPARIN SODIUM (PORCINE) 1000 UNIT/ML IJ SOLN
INTRAMUSCULAR | Status: DC | PRN
  Administered 2022-10-21: 1000 [IU] via INTRAVENOUS

## 2022-10-21 MED ORDER — ONDANSETRON HCL 4 MG/2ML IJ SOLN
INTRAMUSCULAR | Status: DC | PRN
  Administered 2022-10-21: 4 mg via INTRAVENOUS

## 2022-10-21 MED ORDER — DIPHENHYDRAMINE HCL 50 MG/ML IJ SOLN: 25.0000 mg | Freq: Once | INTRAMUSCULAR | Status: DC

## 2022-10-21 MED ORDER — ROCURONIUM BROMIDE 10 MG/ML (PF) SYRINGE
PREFILLED_SYRINGE | INTRAVENOUS | Status: DC | PRN
  Administered 2022-10-21: 60 mg via INTRAVENOUS

## 2022-10-21 MED ORDER — VASOPRESSIN 20 UNIT/ML IV SOLN
INTRAVENOUS | Status: DC | PRN
  Administered 2022-10-21: 1 [IU] via INTRAVENOUS

## 2022-10-21 MED ORDER — PROPOFOL 10 MG/ML IV BOLUS
INTRAVENOUS | Status: DC | PRN
  Administered 2022-10-21: 50 mg via INTRAVENOUS
  Administered 2022-10-21: 30 mg via INTRAVENOUS
  Administered 2022-10-21: 50 mg via INTRAVENOUS

## 2022-10-21 MED ORDER — NOREPINEPHRINE BITARTRATE 1 MG/ML IV SOLN
INTRAVENOUS | Status: AC
  Filled 2022-10-21: qty 4

## 2022-10-21 MED ORDER — ACETAMINOPHEN 500 MG PO TABS
1000.0000 mg | ORAL_TABLET | Freq: Once | ORAL | Status: AC
  Administered 2022-10-21: 1000 mg via ORAL
  Filled 2022-10-21: qty 2

## 2022-10-21 MED ORDER — SODIUM CHLORIDE 0.9% FLUSH: 3.0000 mL | INTRAVENOUS | Status: DC | PRN

## 2022-10-21 MED ORDER — MIDAZOLAM HCL 2 MG/2ML IJ SOLN
INTRAMUSCULAR | Status: AC
  Filled 2022-10-21: qty 2

## 2022-10-21 MED ORDER — DOBUTAMINE INFUSION FOR EP/ECHO/NUC (1000 MCG/ML)
INTRAVENOUS | Status: AC
  Filled 2022-10-21: qty 250

## 2022-10-21 MED ORDER — DIPHENHYDRAMINE HCL 50 MG/ML IJ SOLN: 25.0000 mg | Freq: Once | INTRAMUSCULAR | Status: AC

## 2022-10-21 MED ORDER — ALBUMIN HUMAN 5 % IV SOLN
INTRAVENOUS | Status: AC
  Filled 2022-10-21: qty 250

## 2022-10-21 MED ORDER — MIDAZOLAM HCL 2 MG/2ML IJ SOLN
INTRAMUSCULAR | Status: DC | PRN
  Administered 2022-10-21: 2 mg via INTRAVENOUS

## 2022-10-21 MED ORDER — FENTANYL CITRATE (PF) 100 MCG/2ML IJ SOLN
INTRAMUSCULAR | Status: AC
  Filled 2022-10-21: qty 2

## 2022-10-21 MED ORDER — DEXAMETHASONE SODIUM PHOSPHATE 10 MG/ML IJ SOLN
INTRAMUSCULAR | Status: DC | PRN
  Administered 2022-10-21: 4 mg via INTRAVENOUS

## 2022-10-21 MED ORDER — DOBUTAMINE INFUSION FOR EP/ECHO/NUC (1000 MCG/ML)
INTRAVENOUS | Status: DC | PRN
  Administered 2022-10-21: 20 ug/kg/min via INTRAVENOUS

## 2022-10-21 MED ORDER — SODIUM CHLORIDE 0.9 % IV SOLN: 250.0000 mL | INTRAVENOUS | Status: DC | PRN

## 2022-10-21 MED ORDER — SODIUM CHLORIDE 0.9 % IV SOLN: INTRAVENOUS | Status: DC

## 2022-10-21 MED ORDER — ACETAMINOPHEN 325 MG PO TABS: 650.0000 mg | ORAL_TABLET | ORAL | Status: DC | PRN

## 2022-10-21 MED ORDER — PHENYLEPHRINE 80 MCG/ML (10ML) SYRINGE FOR IV PUSH (FOR BLOOD PRESSURE SUPPORT)
PREFILLED_SYRINGE | INTRAVENOUS | Status: DC | PRN
  Administered 2022-10-21 (×4): 80 ug via INTRAVENOUS
  Administered 2022-10-21: 160 ug via INTRAVENOUS
  Administered 2022-10-21: 80 ug via INTRAVENOUS

## 2022-10-21 SURGICAL SUPPLY — 20 items
BAG SNAP BAND KOVER 36X36 (MISCELLANEOUS) IMPLANT
CATH ABLAT QDOT MICRO BI TC DF (CATHETERS) IMPLANT
CATH OCTARAY 2.0 F 3-3-3-3-3 (CATHETERS) IMPLANT
CATH PIGTAIL STEERABLE D1 8.7 (WIRE) IMPLANT
CATH S-M CIRCA TEMP PROBE (CATHETERS) IMPLANT
CATH SOUNDSTAR ECO 8FR (CATHETERS) IMPLANT
CATH WEBSTER BI DIR CS D-F CRV (CATHETERS) IMPLANT
CLOSURE PERCLOSE PROSTYLE (VASCULAR PRODUCTS) IMPLANT
COVER SWIFTLINK CONNECTOR (BAG) ×1 IMPLANT
MAT PREVALON FULL STRYKER (MISCELLANEOUS) IMPLANT
PACK EP LATEX FREE (CUSTOM PROCEDURE TRAY) ×1
PACK EP LF (CUSTOM PROCEDURE TRAY) ×1 IMPLANT
PAD DEFIB RADIO PHYSIO CONN (PAD) ×1 IMPLANT
PATCH CARTO3 (PAD) IMPLANT
SHEATH CARTO VIZIGO SM CVD (SHEATH) IMPLANT
SHEATH PINNACLE 7F 10CM (SHEATH) IMPLANT
SHEATH PINNACLE 8F 10CM (SHEATH) IMPLANT
SHEATH PINNACLE 9F 10CM (SHEATH) IMPLANT
SHEATH PROBE COVER 6X72 (BAG) IMPLANT
TUBING SMART ABLATE COOLFLOW (TUBING) IMPLANT

## 2022-10-21 NOTE — Anesthesia Procedure Notes (Addendum)
Procedure Name: Intubation Date/Time: 10/21/2022 8:01 AM  Performed by: Katina Degree, CRNAPre-anesthesia Checklist: Patient identified, Emergency Drugs available, Suction available and Patient being monitored Patient Re-evaluated:Patient Re-evaluated prior to induction Oxygen Delivery Method: Circle system utilized Preoxygenation: Pre-oxygenation with 100% oxygen Induction Type: IV induction Ventilation: Oral airway inserted - appropriate to patient size and Mask ventilation without difficulty Laryngoscope Size: Mac and 4 Grade View: Grade I Tube type: Oral Tube size: 7.0 mm Number of attempts: 1 Airway Equipment and Method: Stylet and Oral airway Placement Confirmation: ETT inserted through vocal cords under direct vision, positive ETCO2 and breath sounds checked- equal and bilateral Secured at: 23 cm Tube secured with: Tape Dental Injury: Teeth and Oropharynx as per pre-operative assessment

## 2022-10-21 NOTE — Discharge Instructions (Signed)

## 2022-10-21 NOTE — Interval H&P Note (Signed)
History and Physical Interval Note:  10/21/2022 7:09 AM  Suzanne Long  has presented today for surgery, with the diagnosis of afib.  The various methods of treatment have been discussed with the patient and family. After consideration of risks, benefits and other options for treatment, the patient has consented to  Procedure(s): ATRIAL FIBRILLATION ABLATION (N/A) as a surgical intervention.  The patient's history has been reviewed, patient examined, no change in status, stable for surgery.  I have reviewed the patient's chart and labs.  Questions were answered to the patient's satisfaction.     Nathin Saran [employer]

## 2022-10-21 NOTE — Progress Notes (Signed)
Pt arrived to holding area post anesthesia care and was hypotensive with BP 65/46. CRNA Tania started neosynephrine gtt and albumin. Pt remained alert and neurologically intact. C/o itching for which 25mg  of benadryl was given per Dr. Myrtis Hopping order. A large rash with redness and swelling was noted and outlined. Dr. Elberta Fortis observed and agreed with benadryl administration.

## 2022-10-21 NOTE — Transfer of Care (Addendum)
Immediate Anesthesia Transfer of Care Note  Patient: Suzanne Long  Procedure(s) Performed: ATRIAL FIBRILLATION ABLATION  Patient Location: Cath Lab  Anesthesia Type:General  Level of Consciousness: awake and alert   Airway & Oxygen Therapy: Patient Spontanous Breathing and Patient connected to nasal cannula oxygen  Post-op Assessment: Report given to RN and Post -op Vital signs reviewed and stable. Ephedrine (25mg ) and albumin given for BP and neosynephrine drip started.  Post vital signs: Reviewed and stable  Last Vitals:  Vitals Value Taken Time  BP 95/61 10/21/22 1030  Temp 36.8 C 10/21/22 1025  Pulse 62 10/21/22 1034  Resp 14 10/21/22 1034  SpO2 100 % 10/21/22 1034  Vitals shown include unvalidated device data.  Last Pain:  Vitals:   10/21/22 1025  TempSrc: Temporal  PainSc: 0-No pain         Complications: There were no known notable events for this encounter.

## 2022-10-21 NOTE — Anesthesia Postprocedure Evaluation (Signed)
Anesthesia Post Note  Patient: Suzanne Long  Procedure(s) Performed: ATRIAL FIBRILLATION ABLATION     Patient location during evaluation: PACU Anesthesia Type: General Level of consciousness: awake and alert Pain management: pain level controlled Vital Signs Assessment: post-procedure vital signs reviewed and stable Respiratory status: spontaneous breathing, nonlabored ventilation, respiratory function stable and patient connected to nasal cannula oxygen Cardiovascular status: blood pressure returned to baseline and stable Postop Assessment: no apparent nausea or vomiting Anesthetic complications: no   There were no known notable events for this encounter.  Last Vitals:  Vitals:   10/21/22 1300 10/21/22 1330  BP: (!) 88/68 92/64  Pulse: (!) 58 73  Resp: 12 20  Temp:    SpO2: 100% 100%    Last Pain:  Vitals:   10/21/22 1200  TempSrc:   PainSc: 0-No pain                 Kennieth Rad

## 2022-10-21 NOTE — Anesthesia Preprocedure Evaluation (Signed)
Anesthesia Evaluation  Patient identified by MRN, date of birth, ID band Patient awake    Reviewed: Allergy & Precautions, NPO status , Patient's Chart, lab work & pertinent test results  Airway Mallampati: II  TM Distance: >3 FB Neck ROM: Full    Dental  (+) Dental Advisory Given   Pulmonary neg pulmonary ROS   breath sounds clear to auscultation       Cardiovascular hypertension, Pt. on medications +CHF   Rhythm:Regular Rate:Normal  EF 25-30%.   Neuro/Psych negative neurological ROS     GI/Hepatic negative GI ROS, Neg liver ROS,,,  Endo/Other  Hypothyroidism    Renal/GU negative Renal ROS     Musculoskeletal   Abdominal   Peds  Hematology negative hematology ROS (+)   Anesthesia Other Findings   Reproductive/Obstetrics                             Anesthesia Physical Anesthesia Plan  ASA: 4  Anesthesia Plan: General   Post-op Pain Management: Tylenol PO (pre-op)*   Induction: Intravenous  PONV Risk Score and Plan: 3 and Ondansetron, Dexamethasone, Midazolam and Treatment may vary due to age or medical condition  Airway Management Planned: Oral ETT  Additional Equipment: ClearSight  Intra-op Plan:   Post-operative Plan: Extubation in OR  Informed Consent: I have reviewed the patients History and Physical, chart, labs and discussed the procedure including the risks, benefits and alternatives for the proposed anesthesia with the patient or authorized representative who has indicated his/her understanding and acceptance.     Dental advisory given  Plan Discussed with: CRNA  Anesthesia Plan Comments:        Anesthesia Quick Evaluation

## 2022-10-22 MED FILL — Norepinephrine Bitartrate IV Soln 1 MG/ML (Base Equivalent): INTRAVENOUS | Qty: 4 | Status: AC

## 2022-11-18 ENCOUNTER — Encounter (HOSPITAL_COMMUNITY): Payer: Self-pay | Admitting: Physician Assistant

## 2022-11-18 ENCOUNTER — Ambulatory Visit (HOSPITAL_COMMUNITY)
Admission: RE | Admit: 2022-11-18 | Discharge: 2022-11-18 | Disposition: A | Discharge status: Home / Self Care | Source: Ambulatory Visit | Attending: Physician Assistant | Admitting: Physician Assistant

## 2022-11-18 VITALS — BP 124/72 | HR 52 | Ht 68.5 in | Wt 283.8 lb

## 2022-11-18 DIAGNOSIS — I4892 Unspecified atrial flutter: Secondary | ICD-10-CM | POA: Diagnosis not present

## 2022-11-18 DIAGNOSIS — E039 Hypothyroidism, unspecified: Secondary | ICD-10-CM | POA: Insufficient documentation

## 2022-11-18 DIAGNOSIS — I5022 Chronic systolic (congestive) heart failure: Secondary | ICD-10-CM | POA: Diagnosis not present

## 2022-11-18 DIAGNOSIS — Z560 Unemployment, unspecified: Secondary | ICD-10-CM | POA: Insufficient documentation

## 2022-11-18 DIAGNOSIS — Z7901 Long term (current) use of anticoagulants: Secondary | ICD-10-CM | POA: Insufficient documentation

## 2022-11-18 DIAGNOSIS — Z5181 Encounter for therapeutic drug level monitoring: Secondary | ICD-10-CM

## 2022-11-18 DIAGNOSIS — I110 Hypertensive heart disease with heart failure: Secondary | ICD-10-CM | POA: Diagnosis not present

## 2022-11-18 DIAGNOSIS — Z79899 Other long term (current) drug therapy: Secondary | ICD-10-CM | POA: Diagnosis not present

## 2022-11-18 DIAGNOSIS — D6869 Other thrombophilia: Secondary | ICD-10-CM | POA: Diagnosis not present

## 2022-11-18 DIAGNOSIS — G7101 Duchenne or Becker muscular dystrophy: Secondary | ICD-10-CM | POA: Diagnosis not present

## 2022-11-18 DIAGNOSIS — I480 Paroxysmal atrial fibrillation: Secondary | ICD-10-CM | POA: Diagnosis present

## 2022-11-18 DIAGNOSIS — Z8249 Family history of ischemic heart disease and other diseases of the circulatory system: Secondary | ICD-10-CM | POA: Insufficient documentation

## 2022-11-18 DIAGNOSIS — Z6841 Body Mass Index (BMI) 40.0 and over, adult: Secondary | ICD-10-CM | POA: Insufficient documentation

## 2022-11-18 DIAGNOSIS — E669 Obesity, unspecified: Secondary | ICD-10-CM | POA: Diagnosis not present

## 2022-11-18 NOTE — Progress Notes (Signed)
Primary Care Physician: Renaye Rakers, MD Primary Cardiologist: Dr Katrinka Blazing Primary Electrophysiologist: Dr Elberta Fortis  [facility]: Dr Shirlee Latch Referring Physician: Dr Alphia Kava is a 52 y.o. female with a history of chronic systolic CHF, HTN, hypothyroidism, Duchenne muscular dystrophy, atrial flutter, atrial fibrillation who presents for follow up in the [facility]. Patient is s/p typical flutter ablation 01/22/21 but has since developed atypical atrial flutter and atrial fibrillation. Patient is on Eliquis for a CHADS2VASC score of 3. She was seen at the ED 03/29/22 with palpitations and SOB. She had a wide-complex tachycardia, heart rate 224 bpm.  She was given adenosine which did not convert her to sinus rhythm.  She was started on IV amiodarone which slowed her tachycardia and revealing a 2-1 atrial flutter.  She converted to SR, then afib, then SR again. She was discharged on amiodarone load.  On follow up today, patient is s/p afib and flutter ablation with Dr Elberta Fortis on 10/21/22. She reports that she has done well since the ablation with no tachypalpitations. She denies chest pain, swallowing pain, or groin issues. No bleeding issues on anticoagulation.   Today, she denies symptoms of palpitations, chest pain, shortness of breath, orthopnea, PND, lower extremity edema, dizziness, presyncope, syncope, snoring, daytime somnolence, bleeding, or neurologic sequela. The patient is tolerating medications without difficulties and is otherwise without complaint today.    Atrial Fibrillation Risk Factors:  she does not have symptoms or diagnosis of sleep apnea. she does not have a history of rheumatic fever.   she has a BMI of Body mass index is 42.52 kg/m.Marland Kitchen Filed Weights   11/18/22 1146  Weight: 128.7 kg    Family History  Problem Relation Age of Onset   Diabetes Mother    Hypertension Mother    Depression Mother    Obesity Mother    Muscular dystrophy  Brother    Kidney failure Brother    Muscular dystrophy Son    Migraines Neg Hx    Multiple sclerosis Neg Hx    Thyroid disease Neg Hx      Atrial Fibrillation Management history:  Previous antiarrhythmic drugs: amiodarone  Previous cardioversions: 09/26/20 Previous ablations: flutter 01/22/21, 10/21/22 CHADS2VASC score: 3 Anticoagulation history: Eliquis   Past Medical History:  Diagnosis Date   CHF (congestive heart failure) (HCC)    Dyspnea    Hypertension    Hypothyroidism    Joint pain    Multiple food allergies    NICM (nonischemic cardiomyopathy) (HCC)    a. Echo 2/17:  moderate LVH, EF 30-35%, diffuse HK, moderate LAE   NICM (nonischemic cardiomyopathy) (HCC) 09/26/2020   Non-ST elevation (NSTEMI) myocardial infarction (HCC) 09/26/2020   Past Surgical History:  Procedure Laterality Date   A-FLUTTER ABLATION N/A 01/22/2021   Procedure: A-FLUTTER ABLATION;  Surgeon: Regan Lemming, MD;  Location: [facility] INVASIVE CV LAB;  Service: Cardiovascular;  Laterality: N/A;   ATRIAL FIBRILLATION ABLATION N/A 10/21/2022   Procedure: ATRIAL FIBRILLATION ABLATION;  Surgeon: Regan Lemming, MD;  Location: [facility] INVASIVE CV LAB;  Service: Cardiovascular;  Laterality: N/A;   BUBBLE STUDY  09/26/2020   Procedure: BUBBLE STUDY;  Surgeon: Meriam Sprague, MD;  Location: [facility] ENDOSCOPY;  Service: Cardiovascular;;   CARDIOVERSION N/A 09/26/2020   Procedure: CARDIOVERSION;  Surgeon: Meriam Sprague, MD;  Location: [facility] ENDOSCOPY;  Service: Cardiovascular;  Laterality: N/A;   LEFT HEART CATH AND CORONARY ANGIOGRAPHY N/A 09/24/2020   Procedure: LEFT HEART CATH AND  CORONARY ANGIOGRAPHY;  Surgeon: Runell Gess, MD;  Location[facility] INVASIVE CV LAB;  Service: Cardiovascular;  Laterality: N/A;   MYOMECTOMY     None     TEE WITHOUT CARDIOVERSION N/A 09/26/2020   Procedure: TRANSESOPHAGEAL ECHOCARDIOGRAM (TEE);  Surgeon: Meriam Sprague, MD;  Location: [facility] ENDOSCOPY;  Service:  Cardiovascular;  Laterality: N/A;    Current Outpatient Medications  Medication Sig Dispense Refill   amiodarone (PACERONE) 200 MG tablet Take 1 tablet (200 mg total) by mouth daily. 90 tablet 3   apixaban (ELIQUIS) 5 MG TABS tablet Take 1 tablet (5 mg total) by mouth 2 (two) times daily. 180 tablet 3   BIOTIN PO Take 1 tablet by mouth daily.     carvedilol (COREG) 12.5 MG tablet TAKE 1 TABLET BY MOUTH TWICE DAILY 60 tablet 11   Cholecalciferol (VITAMIN D-3 PO) Take 1 tablet by mouth daily.     empagliflozin (JARDIANCE) 10 MG TABS tablet Take 1 tablet (10 mg total) by mouth daily. 90 tablet 1   furosemide (LASIX) 20 MG tablet Take 1 tablet (20 mg total) by mouth every other day. 90 tablet 3   isosorbide-hydrALAZINE (BIDIL) 20-37.5 MG tablet Take 0.5 tablets by mouth 3 (three) times daily. 90 tablet 3   sacubitril-valsartan (ENTRESTO) 97-103 MG Take 1 tablet by mouth 2 (two) times daily. 60 tablet 11   spironolactone (ALDACTONE) 25 MG tablet Take 1 tablet (25 mg total) by mouth daily. 90 tablet 3   Vitamin D, Ergocalciferol, (DRISDOL) 1.25 MG (50000 UNIT) CAPS capsule TAKE 1 CAPSULE BY MOUTH EVERY 7 DAYS 8 capsule 0   Multiple Vitamin (MULTIVITAMIN WITH MINERALS) TABS tablet Take 1 tablet by mouth 3 (three) times a week. (Patient not taking: Reported on 11/18/2022)     polyvinyl alcohol (LIQUIFILM TEARS) 1.4 % ophthalmic solution Place 1 drop into both eyes in the morning, at noon, and at bedtime.     predniSONE (DELTASONE) 50 MG tablet Take 1 tablet (50 mg total) 13 hours prior, 7 hours prior and 1 hour prior to CT testing 3 tablet 0   No current facility-administered medications for this encounter.    Allergies  Allergen Reactions   Shellfish Allergy Anaphylaxis    Uncoded Allergy. Allergen: SHELLFISH   Gadolinium Derivatives Nausea And Vomiting    Vomiting after 20cc multihance injection   Iodine Itching   Latex Rash    Social History   Socioeconomic History   Marital status:  Married    Spouse name: Danny   Number of children: 2   Years of education: 12   Highest education level: Not on file  Occupational History   Occupation: unemployed  Tobacco Use   Smoking status: Never   Smokeless tobacco: Never   Tobacco comments:    Never smoke 04/28/22  Vaping Use   Vaping Use: Never used  Substance and Sexual Activity   Alcohol use: Yes    Alcohol/week: 2.0 - 3.0 standard drinks of alcohol    Types: 2 - 3 Standard drinks or equivalent per week    Comment: 2-3 a year 04/28/22   Drug use: Never   Sexual activity: Not on file  Other Topics Concern   Not on file  Social History Narrative   Patient is married husband name Danny    Patient has 2 children. Both have past away.    Patient is right handed.    Patient is unemployed    Patient has a high school education  Social Determinants of Health   Financial Resource Strain: Low Risk  (09/24/2020)   Overall Financial Resource Strain (CARDIA)    Difficulty of Paying Living Expenses: Not hard at all  Food Insecurity: No Food Insecurity (09/24/2020)   Hunger Vital Sign    Worried About Running Out of Food in the Last Year: Never true    Ran Out of Food in the Last Year: Never true  Transportation Needs: No Transportation Needs (09/24/2020)   PRAPARE - Administrator, Civil Service (Medical): No    Lack of Transportation (Non-Medical): No  Physical Activity: Inactive (09/24/2020)   Exercise Vital Sign    Days of Exercise per Week: 0 days    Minutes of Exercise per Session: 0 min  Stress: Not on file  Social Connections: Not on file  Intimate Partner Violence: Not on file     ROS- All systems are reviewed and negative except as per the HPI above.  Physical Exam: Vitals:   11/18/22 1146  BP: 124/72  Pulse: (!) 52  Weight: 128.7 kg  Height: 5' 8.5" (1.74 m)    GEN- The patient is a well appearing female, alert and oriented x 3 today.   HEENT-head normocephalic, atraumatic, sclera  clear, conjunctiva pink, hearing intact, trachea midline. Lungs- Clear to ausculation bilaterally, normal work of breathing Heart- Regular rate and rhythm, bradycardia, no murmurs, rubs or gallops  GI- soft, NT, ND, + BS Extremities- no clubbing, cyanosis, or edema MS- no significant deformity or atrophy Skin- no rash or lesion Psych- euthymic mood, full affect Neuro- strength and sensation are intact   Wt Readings from Last 3 Encounters:  11/18/22 128.7 kg  10/21/22 126.6 kg  10/06/22 127 kg    EKG today demonstrates  SB, NST Vent. rate 52 BPM PR interval 154 ms QRS duration 98 ms QT/QTcB 412/383 ms  Echo 04/29/21 demonstrated   1. Left ventricular ejection fraction, by estimation, is 20 to 25%. The  left ventricle has severely decreased function. The left ventricle  demonstrates global hypokinesis. The left ventricular internal cavity size was moderately dilated. Left ventricular diastolic parameters are consistent with Grade I diastolic dysfunction (impaired relaxation). The average left ventricular global longitudinal strain is -13.7 %. The global longitudinal strain is abnormal.   2. Right ventricular systolic function is normal. The right ventricular  size is normal. Tricuspid regurgitation signal is inadequate for assessing PA pressure.   3. The mitral valve is normal in structure. Trivial mitral valve  regurgitation. No evidence of mitral stenosis.   4. The aortic valve is tricuspid. Aortic valve regurgitation is not  visualized. No aortic stenosis is present.   5. The inferior vena cava is normal in size with greater than 50%  respiratory variability, suggesting right atrial pressure of 3 mmHg.   Epic records are reviewed at length today  CHA2DS2-VASc Score = 3  The patient's score is based upon: CHF History: 1 HTN History: 1 Diabetes History: 0 Stroke History: 0 Vascular Disease History: 0 Age Score: 0 Gender Score: 1       ASSESSMENT AND PLAN: 1.  Paroxysmal Atrial Fibrillation/atrial flutter The patient's CHA2DS2-VASc score is 3, indicating a 3.2% annual risk of stroke.   S/p flutter ablation 01/22/21. S/p afib and flutter ablation 10/21/22 Patient appears to be maintaining SR.  Continue amiodarone 200 mg daily for now.  Continue carvedilol 12.5 mg BID Continue Eliquis 5 mg BID with no missed doses for 3 months post ablation.   2.  Secondary Hypercoagulable State (ICD10:  D68.69) The patient is at significant risk for stroke/thromboembolism based upon her CHA2DS2-VASc Score of 3.  Continue Apixaban (Eliquis).   3. Obesity Body mass index is 42.52 kg/m. Lifestyle modification was discussed and encouraged including regular physical activity and weight reduction.  4. Chronic systolic CHF EF 20-25% Followed in [facility]. Appears euvolemic today.  5. HTN Stable, no changes today.   Follow up with Dr Elberta Fortis as scheduled.    [name] PA-C [facility] [address] [place], [place] [address] [phone] 11/18/2022 12:08 PM

## 2023-01-02 ENCOUNTER — Telehealth (HOSPITAL_COMMUNITY): Payer: Self-pay

## 2023-01-02 ENCOUNTER — Encounter (HOSPITAL_COMMUNITY): Payer: Self-pay

## 2023-01-02 ENCOUNTER — Ambulatory Visit (HOSPITAL_COMMUNITY)
Admission: RE | Admit: 2023-01-02 | Discharge: 2023-01-02 | Disposition: A | Discharge status: Home / Self Care | Source: Ambulatory Visit | Attending: Family Medicine | Admitting: Family Medicine

## 2023-01-02 VITALS — BP 122/70 | HR 75 | Wt 284.8 lb

## 2023-01-02 DIAGNOSIS — Z6841 Body Mass Index (BMI) 40.0 and over, adult: Secondary | ICD-10-CM | POA: Diagnosis not present

## 2023-01-02 DIAGNOSIS — I110 Hypertensive heart disease with heart failure: Secondary | ICD-10-CM | POA: Diagnosis present

## 2023-01-02 DIAGNOSIS — I10 Essential (primary) hypertension: Secondary | ICD-10-CM | POA: Diagnosis not present

## 2023-01-02 DIAGNOSIS — Z79899 Other long term (current) drug therapy: Secondary | ICD-10-CM | POA: Diagnosis not present

## 2023-01-02 DIAGNOSIS — Z7984 Long term (current) use of oral hypoglycemic drugs: Secondary | ICD-10-CM | POA: Insufficient documentation

## 2023-01-02 DIAGNOSIS — I493 Ventricular premature depolarization: Secondary | ICD-10-CM | POA: Insufficient documentation

## 2023-01-02 DIAGNOSIS — E038 Other specified hypothyroidism: Secondary | ICD-10-CM

## 2023-01-02 DIAGNOSIS — Z7901 Long term (current) use of anticoagulants: Secondary | ICD-10-CM | POA: Insufficient documentation

## 2023-01-02 DIAGNOSIS — I480 Paroxysmal atrial fibrillation: Secondary | ICD-10-CM | POA: Diagnosis not present

## 2023-01-02 DIAGNOSIS — I428 Other cardiomyopathies: Secondary | ICD-10-CM | POA: Diagnosis not present

## 2023-01-02 DIAGNOSIS — I5022 Chronic systolic (congestive) heart failure: Secondary | ICD-10-CM

## 2023-01-02 DIAGNOSIS — R000 Tachycardia, unspecified: Secondary | ICD-10-CM | POA: Diagnosis not present

## 2023-01-02 DIAGNOSIS — E039 Hypothyroidism, unspecified: Secondary | ICD-10-CM

## 2023-01-02 DIAGNOSIS — M6281 Muscle weakness (generalized): Secondary | ICD-10-CM | POA: Diagnosis not present

## 2023-01-02 DIAGNOSIS — E669 Obesity, unspecified: Secondary | ICD-10-CM | POA: Diagnosis not present

## 2023-01-02 DIAGNOSIS — I471 Supraventricular tachycardia, unspecified: Secondary | ICD-10-CM

## 2023-01-02 DIAGNOSIS — I483 Typical atrial flutter: Secondary | ICD-10-CM | POA: Diagnosis not present

## 2023-01-02 LAB — BRAIN NATRIURETIC PEPTIDE: B Natriuretic Peptide: 148 pg/mL — ABNORMAL HIGH (ref 0.0–100.0)

## 2023-01-02 LAB — BASIC METABOLIC PANEL
Anion gap: 7 (ref 5–15)
BUN: 9 mg/dL (ref 6–20)
CO2: 27 mmol/L (ref 22–32)
Calcium: 9 mg/dL (ref 8.9–10.3)
Chloride: 103 mmol/L (ref 98–111)
Creatinine, Ser: 1.15 mg/dL — ABNORMAL HIGH (ref 0.44–1.00)
GFR, Estimated: 58 mL/min — ABNORMAL LOW (ref >60)
Glucose, Bld: 93 mg/dL (ref 70–99)
Potassium: 4.5 mmol/L (ref 3.5–5.1)
Sodium: 137 mmol/L (ref 135–145)

## 2023-01-02 NOTE — Patient Instructions (Addendum)
Thank you for coming in today  If you had labs drawn today, any labs that are abnormal the clinic will call you No news is good news  Medications: You will be referred to Pharmacy for [facility]   Follow up appointments:  Your physician recommends that you schedule a follow-up appointment in:  3 months With Dr. Earlean Shawl will receive a reminder letter in the mail a few months in advance. If you don't receive a letter, please call our office to schedule the follow-up appointment.    Do the following things EVERYDAY: Weigh yourself in the morning before breakfast. Write it down and keep it in a log. Take your medicines as prescribed Eat low salt foods--Limit salt (sodium) to 2000 mg per day.  Stay as active as you can everyday Limit all fluids for the day to less than 2 liters   At the [facility], you and your health needs are our priority. As part of our continuing mission to provide you with exceptional heart care, we have created designated Provider Care Teams. These Care Teams include your primary Cardiologist (physician) and Advanced Practice Providers (APPs- Physician Assistants and Nurse Practitioners) who all work together to provide you with the care you need, when you need it.   You may see any of the following providers on your designated Care Team at your next follow up: Dr Arvilla Meres Dr Marca Ancona Dr. Marcos Eke, NP Robbie Lis, [place] [facility] [place], [place] Brynda Peon, NP Karle Plumber, PharmD   Please be sure to bring in all your medications bottles to every appointment.    Thank you for choosing [facility]  If you have any questions or concerns before your next appointment please send [place] a message through [place] or call our office at [phone].    TO LEAVE A MESSAGE FOR THE NURSE SELECT OPTION 2, PLEASE LEAVE A MESSAGE INCLUDING: YOUR NAME DATE OF BIRTH CALL  BACK NUMBER REASON FOR CALL**this is important as we prioritize the call backs  YOU WILL RECEIVE A CALL BACK THE SAME DAY AS LONG AS YOU CALL BEFORE 4:00 PM

## 2023-01-02 NOTE — Progress Notes (Signed)
Patient ID: Suzanne Long, female   DOB: [birth year], 52 y.o.   MRN: [id number] PCP: Dr. Alvis Lemmings, [facility] Cardiology: Dr. Shirlee Latch  52 y.o. with history of HTN and nonischemic cardiomyopathy presents for followup of CHF.      She was diagnosed with CHF initially in 2011.  At that time, she was hospitalized with volume overload.  Coronary angiography in 2011 showed no significant CAD, but EF was about 25%.  BP was very high.  She was started on cardiac meds and BP improved.  Per her report, EF improved.  She eventually stopped all her medications.  She was then hospitalized in 11/16 with CHF exacerbation. EF was 30-35% on 11/16 echo and 30-35% on 2/17 echo.   Echo in 7/17 showed EF up to 40%.   She stopped her medications again.  She was then admitted in 3/22 with atrial flutter/RVR and CHF.  Unsure how long atrial flutter was present as she did not feel palpitations.  She had TEE-guided DCCV.  TEE showed EF 20-25% with severely decreased RV function, mild MR.  LHC in 3/22 showed no significant CAD.  Cardiac MRI in 6/22 showed LV EF 31%, RVEF 52%, basal-mid lateral subendocardial LGE (>50% wall thickness), septal mid-wall LGE.  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 She was seen by Dr. Jomarie Longs, and this gene mutation was actually not thought to be the cause of DMD in her family (she likely has a mutation that was not picked up by the [employer] testing).   In 7/22, she had atrial flutter ablation.   Echo in 10/22 showed EF 20-25% with moderate LV dilation, normal RV, normal IVC.   Patient went to the ER in 06/19/21 with SVT, ?atypical flutter with 2:1 block.  She was in the ER 07/11/21 with SVT rate >200, she was cardioverted by EMS and she was in NSR in the ER.  Zio monitor in 1/23 showed 8 runs NSVT, longest 19 beats and 23 runs SVT (?atrial tachycardia), longest 16 beats.  No atrial fibrillation.   Follow up 1/23 She felt Coreg eliminated  palpitations better than Toprol XL. She continued with chronic muscle ache with activity and BP on the low side. Hydralazine and Imdur were stopped.   Follow up with Dr. Elberta Fortis 1/23, discussed ICD but she is hesitant due to her keloids. Eliquis stopped.   In 9/23, she had both atypical atrial flutter and atrial fibrillation, she converted to NSR with amiodarone.   Echo 3/24  EF 25-30%, mild RV dysfunction. She continued to declined ICD.  S/p AF & AFL ablation 4/24.  Today she returns for HF follow up. Overall feeling fine. She feels great since her ablation. She has no SOB with activity, or lifting. She goes to the gym almost daily, doing TM, lifts weight, and exercise bike. Denies palpitations, abnormal bleeding, CP, dizziness, edema, or PND/Orthopnea. Appetite ok. No fever or chills. Weight at home 270-184 pounds. Taking all medications.    Labs (3/17): K 3.7, creatinine 0.84, SPEP negative, TSH normal.  Labs (5/17): K 3.9, creatinine 0.81, BNP normal Labs (3/22): K 5.2, creatinine 1.0 Labs (7/22): CK 1214 Labs (9/22): K 4.3, creatinine 1.04 Labs (12/22): TSH elevated at 6.6, free T4 normal, K 3.6, creatinine 1.14, BNP 161 Labs (1/23): K 4.3, creatinine 0.89 Labs (3/23): K 4.4, creatinine 1.07, BNP 87 Labs (6/23): K 4.1, creatinine 0.94 Labs (9/23): K 4, creatinine 1.13 Labs (3/24): K 5.0,  creatinine 1.18, normal LFTs, normal TSH  ECG (personally reviewed from 11/18/22 ): SB 52 bpm  PMH: 1. Chronic systolic CHF: 1st noted in 2011.  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  - LHC (2011) with no significant CAD, EF 25%.   - Echo (11/16) with EF 30-35%, diffuse hypokinesis. - Echo (2/17) with EF 30-35%, moderate LVH, diffuse hypokinesis.  - Echo (7/17) with EF 40%, severe LV dilation, grade II diastolic dysfunction.  - TEE (1/61) with EF 20-25% with severely  decreased RV function, mild MR - LHC (3/22): No significant CAD.  - Cardiac MRI (6/22): LV EF 31%, RVEF 52%, basal-mid lateral subendocardial LGE (>50% wall thickness), septal mid-wall LGE.   - Echo (10/22): EF 20-25% with moderate LV dilation, normal RV, normal IVC.  - Echo (3/24): EF 25-30%, mild RV dysfunction.  2. HTN 3. Hypothyroidism 4. Obesity.  5. Goiter 6. Atrial flutter: 3/22, s/p DCCV.  - Ablation in 7/22.  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  8. PVCs: Zio monitor (8/22) with 8% PVCs, no AF/AFL 9. SVT: noted in ER x 2 in 12/22, once possible atypical flutter and once cardioverted before reached ER.  - Zio (1/23): 8 runs NSVT, longest 19 beats and 23 runs SVT (?atrial tachycardia), longest 16 beats.  No atrial fibrillation.  10. Atrial fibrillation noted in 9/23.  - s/p AF and AFL ablation 4/24  SH:  Nonsmoker.  Rare ETOH.  No drugs.    FH: Mother, grandmother with HTN.  Uncle with MI. 2 sons with Duchenne muscular dystrophy (both have passed away).  Brother with Duchenne muscular dystrophy.  ROS: All systems reviewed and negative except as per HPI.   Current Outpatient Medications  Medication Sig Dispense Refill   amiodarone (PACERONE) 200 MG tablet Take 1 tablet (200 mg total) by mouth daily. 90 tablet 3   apixaban (ELIQUIS) 5 MG TABS tablet Take 1 tablet (5 mg total) by mouth 2 (two) times daily. 180 tablet 3   BIOTIN PO Take 1 tablet by mouth daily.     carvedilol (COREG) 12.5 MG tablet TAKE 1 TABLET BY MOUTH TWICE DAILY 60 tablet 11   Cholecalciferol (VITAMIN D-3 PO) Take 1 tablet by mouth daily.     empagliflozin (JARDIANCE) 10 MG TABS tablet Take 1 tablet (10 mg total) by mouth daily. 90 tablet 1   furosemide (LASIX) 20 MG tablet Take 1 tablet (20 mg total) by mouth every other day. 90 tablet 3   isosorbide-hydrALAZINE (BIDIL) 20-37.5 MG tablet Take 0.5 tablets by mouth 3 (three) times daily. 90 tablet 3   Multiple Vitamin  (MULTIVITAMIN WITH MINERALS) TABS tablet Take 1 tablet by mouth 3 (three) times a week.     polyvinyl alcohol (LIQUIFILM TEARS) 1.4 % ophthalmic solution Place 1 drop into both eyes in the morning, at noon, and at bedtime.     sacubitril-valsartan (ENTRESTO) 97-103 MG Take 1 tablet by mouth 2 (two) times daily. 60 tablet 11   spironolactone (ALDACTONE) 25 MG tablet Take 1 tablet (25 mg total) by mouth daily. 90 tablet 3   Vitamin D, Ergocalciferol, (DRISDOL) 1.25 MG (50000 UNIT) CAPS capsule TAKE 1 CAPSULE BY MOUTH EVERY 7 DAYS 8 capsule 0   No current facility-administered medications for this encounter.   Wt Readings from Last 3 Encounters:  01/02/23 129.2 kg (284 lb 12.8 oz)  11/18/22 128.7 kg (283 lb  12.8 oz)  10/21/22 126.6 kg (279 lb)   BP 122/70   Pulse 75   Wt 129.2 kg (284 lb 12.8 oz)   SpO2 95%   BMI 42.67 kg/m  Physical Exam General:  NAD. No resp difficulty, walked into clinic HEENT: Normal Neck: Supple. No JVD. Carotids 2+ bilat; no bruits. No lymphadenopathy or thryomegaly appreciated. Cor: PMI nondisplaced. Regular rate & rhythm. No rubs, gallops or murmurs. Lungs: Clear Abdomen: Soft, obese, nontender, nondistended. No hepatosplenomegaly. No bruits or masses. Good bowel sounds. Extremities: No cyanosis, clubbing, rash, edema Neuro: Alert & oriented x 3, cranial nerves grossly intact. Moves all 4 extremities w/o difficulty. Affect pleasant.  Assessment/Plan: 1. Chronic systolic CHF: Nonischemic cardiomyopathy. No CAD on coronary angiography in 2011 and 3/22.  She had 2 sons and a brother with Duchenne's muscular dystrophy.  She notes chronic muscle weakness with squat->stand.  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  TEE in 3/22 showed EF 20-25% with severe RV dysfunction.  Cardiac MRI in 6/22 showed LV EF 31%, RVEF 52%, basal-mid lateral subendocardial  LGE (>50% wall thickness), septal mid-wall LGE.  LGE, especially in the inferolateral wall, is seen with DMD-related cardiomyopathy. Echo in 10/22 showed EF 20-25%, moderate LV dilation, normal RV.  Echo today showed EF 25-30%, mild RV dysfunction.  NYHA class I, she is not volume overloaded. - Continue Coreg 12.5 mg bid. .  - Continue Entresto 97/103 bid, BMET/BNP today.   - Continue Lasix 20 mg every other day.  - Continue spironolactone 25 mg daily. - Continue Jardiance 10 mg daily.   - Continue Bidil 1/2 tab tid.  - She does not want to have an ICD placed though I have strongly recommended it given significant LGE present on cMRI.  She is very concerned about keloid formation, apparently very pruritic and bothersome when these have formed on her in the past.  We discussed this again today, she continues to decline. 2. HTN: BP controlled.  3. H/o hypothyroidism: No thyroid replacement. Previously following with endocrinology for goiter. Advised follow up. 4. Obesity: We discussed diet and exercise for weight loss again.  Body mass index is 42.67 kg/m. - PCP was working on getting her started on Wegovy, unclear if insurance would cover. Refer to PharmD to see if she can start GLP1s. - Important to lose weight so that heart transplant would be option for her in future if needed. 5. Atrial flutter: Typical flutter in 3/22 with CHF decompensation.  Now s/p DCCV back to NSR and s/p atrial flutter/atrial fibrillation ablation 4/24. She is regular on exam today. 6. DMD heterozygote: I suspect this is the cause of her mild chronic muscle weakness. CPK has been mildly elevated.  7. PVCs: Frequent on 8/22 Zio monitor, 8% total. 5.7% on 1/23 Zio monitor.  - She is now on amiodarone with PAF.   - Continue Coreg as above.  8. SVT: Noted on Zio in 1/23, ? atrial tachy.  9. Atrial fibrillation: Paroxysmal, noted in 9/23 and converted to NSR with amiodarone (she was symptomatic in AF).  - Plan for atrial  fibrillation ablation in 4/24.  - Continue amiodarone 200 mg daily for now.  LFTs and TSH normal (3/24).  She will need regular eye exam. Eventually would like to stop amiodarone, post ablation.  - Continue Eliquis.  She will need to continue this long-term.  No bleeding issues.   Follow up in 3-4 months with Dr. Kathreen Cornfield [facility] FNP-BC 01/02/2023

## 2023-01-02 NOTE — Telephone Encounter (Addendum)
Pt aware, agreeable, and verbalized understanding   ----- Message from Jacklynn Ganong, FNP sent at 01/02/2023  4:11 PM EDT ----- Labs stable, BNP creeping up a bit.  OK to take extra Lasix PRN weight gain/swelling.

## 2023-01-05 ENCOUNTER — Telehealth: Payer: Self-pay | Admitting: Pharmacist

## 2023-01-05 NOTE — Telephone Encounter (Signed)
No hx of DM, will submit for weight loss GLP, she has [employer]. Pulled up their formulary - Wegovy or Zepbound require a PA, copay $38 for 3 mo rx from [facility] if approved. Will submit for Zepbound due to greater efficacy and follow up with pt once determination is made. Key: [id number]. Her plan prefers phentermine, Qsymia, and Contrave first. She cannot use phentermine containing products due to her history of CHF (med is contraindicated), and cannot use Contrave with her history of HTN.

## 2023-01-05 NOTE — Telephone Encounter (Signed)
-----   Message from Demetrius Charity, RN sent at 01/02/2023  1:39 PM EDT ----- Regarding: wegovy start Greetings,  Patient was seen in clinic today and our NP Prince Rome wants to start [facility] on her.  Thanks,  Astronomer

## 2023-01-06 NOTE — Telephone Encounter (Signed)
Prior auth denied. Insurance requires patient to try Qsymia or Contrave first even though I detailed in prior auth request that these medications are contraindicated with patient's cardiac history. Pt is aware.  Will try submitting for [name] under new CV indication - pt has history of NSTEMI. Key: Suzanne Long

## 2023-01-07 NOTE — Telephone Encounter (Addendum)
Suzanne Long denied as well for the same reason Suzanne Long was, for clinically inappropriate reasoning. Can appeal if she would like but she would need to sign appeal form. Pt would like to pursue appeal. I have mailed her the form she needs to sign, she is aware to drop it back off at the [facility] office and will plan to submit appeal at that time.

## 2023-01-28 ENCOUNTER — Encounter: Payer: Self-pay | Admitting: Cardiology

## 2023-01-28 ENCOUNTER — Ambulatory Visit: Attending: Cardiology | Admitting: Cardiology

## 2023-01-28 VITALS — BP 116/66 | HR 59 | Ht 68.5 in | Wt 289.0 lb

## 2023-01-28 DIAGNOSIS — I5022 Chronic systolic (congestive) heart failure: Secondary | ICD-10-CM

## 2023-01-28 DIAGNOSIS — D6869 Other thrombophilia: Secondary | ICD-10-CM | POA: Diagnosis not present

## 2023-01-28 DIAGNOSIS — I4819 Other persistent atrial fibrillation: Secondary | ICD-10-CM | POA: Diagnosis not present

## 2023-01-28 NOTE — Patient Instructions (Signed)
Medication Instructions:  Your physician has recommended you make the following change in your medication:  1) STOP taking amiodarone *If you need a refill on your cardiac medications before your next appointment, please call your pharmacy  Follow-Up: At [facility], you and your health needs are our priority.  As part of our continuing mission to provide you with exceptional heart care, we have created designated Provider Care Teams.  These Care Teams include your primary Cardiologist (physician) and Advanced Practice Providers (APPs -  Physician Assistants and Nurse Practitioners) who all work together to provide you with the care you need, when you need it.  Your next appointment:   6 month(s)  Provider:   You may see Will [name], MD or one of the following Advanced Practice Providers on your designated Care Team:   Francis Dowse, [place] [address]" [place], [place] Sherie Don, NP Canary Brim, NP

## 2023-01-28 NOTE — Progress Notes (Signed)
  Electrophysiology Office Note:   Date:  01/28/2023  ID:  Suzanne Long, DOB July 02, 1971, MRN [id number]  Primary Cardiologist: Lesleigh Noe, MD (Inactive) Electrophysiologist: Valeree Leidy [name], MD      History of Present Illness:   Suzanne Long is a 52 y.o. female with h/o chronic systolic heart failure, atrial fibrillation, atrial flutter, PVCs, hypertension seen today for routine electrophysiology followup.  Since last being seen in our clinic the patient reports doing well.  Since her ablation she has had no further episodes of atrial fibrillation.  She has been able to do all of her daily activities.  She has been working out at [employer] without issue.  She would like to stop amiodarone.  she denies chest pain, palpitations, dyspnea, PND, orthopnea, nausea, vomiting, dizziness, syncope, edema, weight gain, or early satiety.   Review of systems complete and found to be negative unless listed in HPI.   EP Information / Studies Reviewed:    EKG is ordered today. Personal review as below.  EKG Interpretation Date/Time:  Wednesday January 28 2023 15:44:50 EDT Ventricular Rate:  59 PR Interval:  154 QRS Duration:  98 QT Interval:  452 QTC Calculation: 447 R Axis:   -32  Text Interpretation: Sinus bradycardia Left axis deviation When compared with ECG of 18-Nov-2022 11:48, No significant change since last tracing Confirmed by Lois Ostrom ([id number]) on 01/28/2023 4:00:57 PM     Risk Assessment/Calculations:    CHA2DS2-VASc Score = 3   This indicates a 3.2% annual risk of stroke. The patient's score is based upon: CHF History: 1 HTN History: 1 Diabetes History: 0 Stroke History: 0 Vascular Disease History: 0 Age Score: 0 Gender Score: 1             Physical Exam:   VS:  BP 116/66   Pulse (!) 59   Ht 5' 8.5" (1.74 m)   Wt 289 lb (131.1 kg)   SpO2 98%   BMI 43.30 kg/m    Wt Readings from Last 3 Encounters:  01/28/23 289 lb (131.1 kg)  01/02/23 284 lb 12.8 oz  (129.2 kg)  11/18/22 283 lb 12.8 oz (128.7 kg)     GEN: Well nourished, well developed in no acute distress NECK: No JVD; No carotid bruits CARDIAC: Regular rate and rhythm, no murmurs, rubs, gallops RESPIRATORY:  Clear to auscultation without rales, wheezing or rhonchi  ABDOMEN: Soft, non-tender, non-distended EXTREMITIES:  No edema; No deformity   ASSESSMENT AND PLAN:    1.  Typical atrial flutter: Status post ablation 01/22/2021.  No obvious recurrence.  2.  Persistent atrial fibrillation/atypical atrial flutter: CHA2DS2-VASc of 3.  Currently on amiodarone, carvedilol, Eliquis.  Post ablation 10/21/2022.  She has remained in sinus rhythm.  Suzanne Long stop amiodarone today.  3.  Secondary hypercoagulable state: Currently on Eliquis for atrial fibrillation  4.  Chronic systolic heart failure: Due to nonischemic cardiomyopathy.  Currently on optimal medical therapy per primary cardiology.  Has refused ICD therapy due to formation of keloids.  5.  Hypertension: Currently well-controlled  Follow up with Dr. Elberta Fortis in 6 months  Signed, Neddie Steedman [name], MD

## 2023-03-19 ENCOUNTER — Ambulatory Visit: Admitting: Internal Medicine

## 2023-03-19 ENCOUNTER — Encounter: Payer: Self-pay | Admitting: Internal Medicine

## 2023-03-19 VITALS — BP 116/72 | HR 61 | Ht 68.5 in | Wt 288.0 lb

## 2023-03-19 DIAGNOSIS — E010 Iodine-deficiency related diffuse (endemic) goiter: Secondary | ICD-10-CM | POA: Diagnosis not present

## 2023-03-19 LAB — T3, FREE: T3, Free: 3.8 pg/mL (ref 2.3–4.2)

## 2023-03-19 LAB — T4, FREE: Free T4: 1.06 ng/dL (ref 0.60–1.60)

## 2023-03-19 LAB — TSH: TSH: 2.73 u[IU]/mL (ref 0.35–5.50)

## 2023-03-19 NOTE — Progress Notes (Signed)
Name: Suzanne Long  MRN/ DOB: [id number], [birth year]    Age/ Sex: 52 y.o., female    PCP: Renaye Rakers, MD   Reason for Endocrinology Evaluation: Goiter     Date of Initial Endocrinology Evaluation: 03/19/2023     HPI: Ms. Suzanne Long is a 52 y.o. female with a past medical history of CHF, HTN, NSTEMI. The patient presented for initial endocrinology clinic visit on 03/19/2023 for consultative assistance with her goiter.   Patient has been referred here for further evaluation of goiter which was noted in 2010 with stability confirmed in 2017 by ultrasound revealing heterogeneous thyroid gland.  Patient was noted to have elevated iodine levels at 942.8 UG/L (reference 40-92) in 04/2022, unclear the circumstances that caused iodine checkup.   Patient is on biotin as well as multivitamin, last Biotin/MVI  was a few months ago   She denies any recent local neck swelling  No dysphagia  No XRT No recent palpitations  Has tremors occasionally  Takes laxatives for constipation   Patient received amiodarone 03/2022 initially through IV subsequently orally, this was discontinued 01/2023   No FH of thyroid disease     HISTORY:  Past Medical History:  Past Medical History:  Diagnosis Date   CHF (congestive heart failure) (HCC)    Dyspnea    Hypertension    Hypothyroidism    Joint pain    Multiple food allergies    NICM (nonischemic cardiomyopathy) (HCC)    a. Echo 2/17:  moderate LVH, EF 30-35%, diffuse HK, moderate LAE   NICM (nonischemic cardiomyopathy) (HCC) 09/26/2020   Non-ST elevation (NSTEMI) myocardial infarction (HCC) 09/26/2020   Past Surgical History:  Past Surgical History:  Procedure Laterality Date   A-FLUTTER ABLATION N/A 01/22/2021   Procedure: A-FLUTTER ABLATION;  Surgeon: Regan Lemming, MD;  Location: [facility] INVASIVE CV LAB;  Service: Cardiovascular;  Laterality: N/A;   ATRIAL FIBRILLATION ABLATION N/A 10/21/2022   Procedure: ATRIAL FIBRILLATION  ABLATION;  Surgeon: Regan Lemming, MD;  Location: [facility] INVASIVE CV LAB;  Service: Cardiovascular;  Laterality: N/A;   BUBBLE STUDY  09/26/2020   Procedure: BUBBLE STUDY;  Surgeon: Meriam Sprague, MD;  Location: [facility] ENDOSCOPY;  Service: Cardiovascular;;   CARDIOVERSION N/A 09/26/2020   Procedure: CARDIOVERSION;  Surgeon: Meriam Sprague, MD;  Location[facility] ENDOSCOPY;  Service: Cardiovascular;  Laterality: N/A;   LEFT HEART CATH AND CORONARY ANGIOGRAPHY N/A 09/24/2020   Procedure: LEFT HEART CATH AND CORONARY ANGIOGRAPHY;  Surgeon: Runell Gess, MD;  Location: [facility] INVASIVE CV LAB;  Service: Cardiovascular;  Laterality: N/A;   MYOMECTOMY     None     TEE WITHOUT CARDIOVERSION N/A 09/26/2020   Procedure: TRANSESOPHAGEAL ECHOCARDIOGRAM (TEE);  Surgeon: Meriam Sprague, MD;  Location: [facility] ENDOSCOPY;  Service: Cardiovascular;  Laterality: N/A;    Social History:  reports that she has never smoked. She has never used smokeless tobacco. She reports current alcohol use of about 2.0 - 3.0 standard drinks of alcohol per week. She reports that she does not use drugs. Family History: family history includes Depression in her mother; Diabetes in her mother; Hypertension in her mother; Kidney failure in her brother; Muscular dystrophy in her brother and son; Obesity in her mother.   HOME MEDICATIONS: Allergies as of 03/19/2023       Reactions   Shellfish Allergy Anaphylaxis   Uncoded Allergy. Allergen: SHELLFISH   Gadolinium Derivatives Nausea And Vomiting   Vomiting after 20cc multihance injection  Iodine Itching   Latex Rash        Medication List        Accurate as of March 19, 2023  9:35 AM. If you have any questions, ask your nurse or doctor.          STOP taking these medications    Vitamin D (Ergocalciferol) 1.25 MG (50000 UNIT) Caps capsule Commonly known as: DRISDOL Stopped by: Johnney Ou Aqua Denslow       TAKE these medications    apixaban 5 MG Tabs  tablet Commonly known as: ELIQUIS Take 1 tablet (5 mg total) by mouth 2 (two) times daily.   BIOTIN PO Take 1 tablet by mouth daily.   carvedilol 12.5 MG tablet Commonly known as: COREG TAKE 1 TABLET BY MOUTH TWICE DAILY   empagliflozin 10 MG Tabs tablet Commonly known as: JARDIANCE Take 1 tablet (10 mg total) by mouth daily.   Entresto 97-103 MG Generic drug: sacubitril-valsartan Take 1 tablet by mouth 2 (two) times daily.   furosemide 20 MG tablet Commonly known as: LASIX Take 1 tablet (20 mg total) by mouth every other day.   isosorbide-hydrALAZINE 20-37.5 MG tablet Commonly known as: BiDil Take 0.5 tablets by mouth 3 (three) times daily.   multivitamin with minerals Tabs tablet Take 1 tablet by mouth 3 (three) times a week.   polyvinyl alcohol 1.4 % ophthalmic solution Commonly known as: LIQUIFILM TEARS Place 1 drop into both eyes in the morning, at noon, and at bedtime.   spironolactone 25 MG tablet Commonly known as: ALDACTONE Take 1 tablet (25 mg total) by mouth daily.   VITAMIN D-3 PO Take 1 tablet by mouth daily.          REVIEW OF SYSTEMS: A comprehensive ROS was conducted with the patient and is negative except as per HPI   OBJECTIVE:  VS: BP 116/72 (BP Location: Left Arm, Patient Position: Sitting, Cuff Size: Large)   Pulse 61   Ht 5' 8.5" (1.74 m)   Wt 288 lb (130.6 kg)   SpO2 94%   BMI 43.15 kg/m    Wt Readings from Last 3 Encounters:  03/19/23 288 lb (130.6 kg)  01/28/23 289 lb (131.1 kg)  01/02/23 284 lb 12.8 oz (129.2 kg)     EXAM: General: Pt appears well and is in NAD  Neck: General: Supple without adenopathy. Thyroid: Right thyroid asymmetry noted.  No nodules appreciated.   Lungs: Clear with good BS bilat   Heart: Auscultation: RRR.  Abdomen: Soft, nontender  Extremities:  BL LE: No pretibial edema   Mental Status: Judgment, insight: Intact Orientation: Oriented to time, place, and person Mood and affect: No depression,  anxiety, or agitation     DATA REVIEWED:  Latest Reference Range & Units 03/19/23 09:58  TSH 0.35 - 5.50 uIU/mL 2.73  Triiodothyronine,Free,Serum 2.3 - 4.2 pg/mL 3.8  T4,Free(Direct) 0.60 - 1.60 ng/dL 4.78     Thyroid Ultrasound 02/05/2016 Mild enlargement of the thyroid tissue compared to the previous examination. Stable heterogeneity of the thyroid tissue without a discrete nodule.     Old records , labs and images have been reviewed.   ASSESSMENT/PLAN/RECOMMENDATIONS:   Thyromegaly :   - No local neck symptoms -Her last thyroid ultrasound was in 2017 showing stable heterogeneity, on exam today she has been noted with right thyroid asymmetry, we discussed proceeding with repeat ultrasound.  I explained to the patient that if this remains stable no further follow-up will needed on goiter -Other than  difficulty losing weight, she is clinically euthyroid    2.  Elevated iodine:  -She had iodine checked October 2023, which was elevated, this is explained by the fact that she was started on amiodarone the prior month. - Reassurance provided    FU as needed   Signed electronically by: Lyndle Herrlich, MD  [facility] Endocrinology  [employer] [address] [place] [place], [place] [address] Phone: [phone] FAX: [phone]   CC: Renaye Rakers, MD [address] [place] [address] Phone: [phone] Fax: [phone]   Return to Endocrinology clinic as below: No future appointments.

## 2023-03-23 ENCOUNTER — Ambulatory Visit
Admission: RE | Admit: 2023-03-23 | Discharge: 2023-03-23 | Disposition: A | Discharge status: Home / Self Care | Source: Ambulatory Visit | Attending: Internal Medicine | Admitting: Internal Medicine

## 2023-03-23 ENCOUNTER — Other Ambulatory Visit

## 2023-03-23 DIAGNOSIS — E010 Iodine-deficiency related diffuse (endemic) goiter: Secondary | ICD-10-CM

## 2023-03-31 ENCOUNTER — Other Ambulatory Visit

## 2023-04-15 ENCOUNTER — Other Ambulatory Visit (HOSPITAL_COMMUNITY): Payer: Self-pay | Admitting: Cardiology

## 2023-04-15 DIAGNOSIS — I428 Other cardiomyopathies: Secondary | ICD-10-CM

## 2023-10-05 ENCOUNTER — Ambulatory Visit (HOSPITAL_COMMUNITY)
Admission: RE | Admit: 2023-10-05 | Discharge: 2023-10-05 | Disposition: A | Discharge status: Home / Self Care | Source: Ambulatory Visit | Attending: Cardiology | Admitting: Cardiology

## 2023-10-05 ENCOUNTER — Encounter (HOSPITAL_COMMUNITY): Payer: Self-pay | Admitting: Cardiology

## 2023-10-05 VITALS — BP 90/58 | HR 83 | Wt 295.2 lb

## 2023-10-05 DIAGNOSIS — M6281 Muscle weakness (generalized): Secondary | ICD-10-CM | POA: Insufficient documentation

## 2023-10-05 DIAGNOSIS — I5022 Chronic systolic (congestive) heart failure: Secondary | ICD-10-CM | POA: Diagnosis not present

## 2023-10-05 DIAGNOSIS — Z79899 Other long term (current) drug therapy: Secondary | ICD-10-CM | POA: Insufficient documentation

## 2023-10-05 DIAGNOSIS — I110 Hypertensive heart disease with heart failure: Secondary | ICD-10-CM | POA: Insufficient documentation

## 2023-10-05 DIAGNOSIS — Z6841 Body Mass Index (BMI) 40.0 and over, adult: Secondary | ICD-10-CM | POA: Insufficient documentation

## 2023-10-05 DIAGNOSIS — Z9089 Acquired absence of other organs: Secondary | ICD-10-CM | POA: Insufficient documentation

## 2023-10-05 DIAGNOSIS — I4719 Other supraventricular tachycardia: Secondary | ICD-10-CM | POA: Insufficient documentation

## 2023-10-05 DIAGNOSIS — Z7901 Long term (current) use of anticoagulants: Secondary | ICD-10-CM | POA: Diagnosis not present

## 2023-10-05 DIAGNOSIS — I493 Ventricular premature depolarization: Secondary | ICD-10-CM | POA: Diagnosis not present

## 2023-10-05 DIAGNOSIS — I480 Paroxysmal atrial fibrillation: Secondary | ICD-10-CM | POA: Insufficient documentation

## 2023-10-05 DIAGNOSIS — Z8249 Family history of ischemic heart disease and other diseases of the circulatory system: Secondary | ICD-10-CM | POA: Diagnosis not present

## 2023-10-05 DIAGNOSIS — I483 Typical atrial flutter: Secondary | ICD-10-CM | POA: Insufficient documentation

## 2023-10-05 DIAGNOSIS — I428 Other cardiomyopathies: Secondary | ICD-10-CM | POA: Diagnosis not present

## 2023-10-05 DIAGNOSIS — E669 Obesity, unspecified: Secondary | ICD-10-CM | POA: Diagnosis not present

## 2023-10-05 LAB — LIPID PANEL
Cholesterol: 169 mg/dL (ref 0–200)
HDL: 51 mg/dL (ref >40)
LDL Cholesterol: 107 mg/dL — ABNORMAL HIGH (ref 0–99)
Total CHOL/HDL Ratio: 3.3 ratio
Triglycerides: 57 mg/dL (ref <150)
VLDL: 11 mg/dL (ref 0–40)

## 2023-10-05 LAB — CBC
HCT: 41.6 % (ref 36.0–46.0)
Hemoglobin: 14 g/dL (ref 12.0–15.0)
MCH: 30.6 pg (ref 26.0–34.0)
MCHC: 33.7 g/dL (ref 30.0–36.0)
MCV: 90.8 fL (ref 80.0–100.0)
Platelets: 286 10*3/uL (ref 150–400)
RBC: 4.58 MIL/uL (ref 3.87–5.11)
RDW: 13.2 % (ref 11.5–15.5)
WBC: 5.6 10*3/uL (ref 4.0–10.5)
nRBC: 0 % (ref 0.0–0.2)

## 2023-10-05 LAB — BASIC METABOLIC PANEL
Anion gap: 9 (ref 5–15)
BUN: 12 mg/dL (ref 6–20)
CO2: 23 mmol/L (ref 22–32)
Calcium: 9.3 mg/dL (ref 8.9–10.3)
Chloride: 104 mmol/L (ref 98–111)
Creatinine, Ser: 1.13 mg/dL — ABNORMAL HIGH (ref 0.44–1.00)
GFR, Estimated: 59 mL/min — ABNORMAL LOW (ref >60)
Glucose, Bld: 91 mg/dL (ref 70–99)
Potassium: 4.4 mmol/L (ref 3.5–5.1)
Sodium: 136 mmol/L (ref 135–145)

## 2023-10-05 LAB — BRAIN NATRIURETIC PEPTIDE: B Natriuretic Peptide: 116.5 pg/mL — ABNORMAL HIGH (ref 0.0–100.0)

## 2023-10-05 NOTE — Progress Notes (Signed)
 Patient ID: Suzanne Long, female   DOB: [birth year], 53 y.o.   MRN: [id number] PCP: Renaye Rakers, MD HF Cardiology: Dr. Shirlee Latch  Chief Complaint: CHF  53 y.o. with history of HTN and nonischemic cardiomyopathy presents for followup of CHF.      She was diagnosed with CHF initially in 2011.  At that time, she was hospitalized with volume overload.  Coronary angiography in 2011 showed no significant CAD, but EF was about 25%.  BP was very high.  She was started on cardiac meds and BP improved.  Per her report, EF improved.  She eventually stopped all her medications.  She was then hospitalized in 11/16 with CHF exacerbation. EF was 30-35% on 11/16 echo and 30-35% on 2/17 echo.   Echo in 7/17 showed EF up to 40%.   She stopped her medications again.  She was then admitted in 3/22 with atrial flutter/RVR and CHF.  Unsure how long atrial flutter was present as she did not feel palpitations.  She had TEE-guided DCCV.  TEE showed EF 20-25% with severely decreased RV function, mild MR.  LHC in 3/22 showed no significant CAD.  Cardiac MRI in 6/22 showed LV EF 31%, RVEF 52%, basal-mid lateral subendocardial LGE (>50% wall thickness), septal mid-wall LGE.  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 She was seen by Dr. Jomarie Longs, and this gene mutation was actually not thought to be the cause of DMD in her family (she likely has a mutation that was not picked up by the [employer] testing).   In 7/22, she had atrial flutter ablation.   Echo in 10/22 showed EF 20-25% with moderate LV dilation, normal RV, normal IVC.   Patient went to the ER in 06/19/21 with SVT, ?atypical flutter with 2:1 block.  She was in the ER 07/11/21 with SVT rate >200, she was cardioverted by EMS and she was in NSR in the ER.  Zio monitor in 1/23 showed 8 runs NSVT, longest 19 beats and 23 runs SVT (?atrial tachycardia), longest 16 beats.  No atrial fibrillation.   Follow up with Dr. Elberta Fortis 1/23, discussed ICD  but she is hesitant due to her keloids.   In 9/23, she had both atypical atrial flutter and atrial fibrillation, she converted to NSR with amiodarone.   Echo 3/24  EF 25-30%, mild RV dysfunction.   S/p AF & AFL ablation 4/24.  Today she returns for HF follow up. No significant exertional dyspnea.  Weight is up 11 lbs.  No orthopnea/PND.  No lightheadedness.  No chest pain.  She has stable weakness in her thighs making it difficult for her to climb stairs.   Labs (3/17): K 3.7, creatinine 0.84, SPEP negative, TSH normal.  Labs (5/17): K 3.9, creatinine 0.81, BNP normal Labs (3/22): K 5.2, creatinine 1.0 Labs (7/22): CK 1214 Labs (9/22): K 4.3, creatinine 1.04 Labs (12/22): TSH elevated at 6.6, free T4 normal, K 3.6, creatinine 1.14, BNP 161 Labs (1/23): K 4.3, creatinine 0.89 Labs (3/23): K 4.4, creatinine 1.07, BNP 87 Labs (6/23): K 4.1, creatinine 0.94 Labs (9/23): K 4, creatinine 1.13 Labs (3/24): K 5.0, creatinine 1.18, normal LFTs, normal TSH Labs (6/24): K 4.5, creatinine 1.15, BNP 148 Labs (9/24): TSH normal  ECG (personally reviewed): NSR, lateral TWIs  PMH: 1. Chronic systolic CHF: 1st noted in 2011.  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  - LHC (2011) with no significant CAD, EF 25%.   - Echo (11/16) with EF 30-35%, diffuse hypokinesis. - Echo (2/17) with EF 30-35%, moderate LVH, diffuse hypokinesis.  - Echo (7/17) with EF 40%, severe LV dilation, grade II diastolic dysfunction.  - TEE (4/69) with EF 20-25% with severely decreased RV function, mild MR - LHC (3/22): No significant CAD.  - Cardiac MRI (6/22): LV EF 31%, RVEF 52%, basal-mid lateral subendocardial LGE (>50% wall thickness), septal mid-wall LGE.   - Echo (10/22): EF 20-25% with moderate LV dilation, normal RV, normal IVC.  - Echo (3/24): EF 25-30%, mild RV dysfunction.  2.  HTN 3. Hypothyroidism 4. Obesity.  5. Goiter 6. Atrial flutter: 3/22, s/p DCCV.  - Ablation in 7/22.  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  8. PVCs: Zio monitor (8/22) with 8% PVCs, no AF/AFL 9. SVT: noted in ER x 2 in 12/22, once possible atypical flutter and once cardioverted before reached ER.  - Zio (1/23): 8 runs NSVT, longest 19 beats and 23 runs SVT (?atrial tachycardia), longest 16 beats.  No atrial fibrillation.  10. Atrial fibrillation noted in 9/23.  - s/p AF and AFL ablation 4/24  SH:  Nonsmoker.  Rare ETOH.  No drugs.    FH: Mother, grandmother with HTN.  Uncle with MI. 2 sons with Duchenne muscular dystrophy (both have passed away).  Brother with Duchenne muscular dystrophy.  ROS: All systems reviewed and negative except as per HPI.   Current Outpatient Medications  Medication Sig Dispense Refill   apixaban (ELIQUIS) 5 MG TABS tablet Take 1 tablet (5 mg total) by mouth 2 (two) times daily. 180 tablet 3   BIOTIN PO Take 1 tablet by mouth daily.     carvedilol (COREG) 12.5 MG tablet TAKE 1 TABLET BY MOUTH TWICE DAILY 60 tablet 11   furosemide (LASIX) 20 MG tablet TAKE 1 TABLET DAILY (DOSE CHANGE) 90 tablet 3   isosorbide-hydrALAZINE (BIDIL) 20-37.5 MG tablet Take 0.5 tablets by mouth 3 (three) times daily. 90 tablet 3   JARDIANCE 10 MG TABS tablet TAKE 1 TABLET DAILY 90 tablet 3   Multiple Vitamin (MULTIVITAMIN WITH MINERALS) TABS tablet Take 1 tablet by mouth daily.     polyvinyl alcohol (LIQUIFILM TEARS) 1.4 % ophthalmic solution Place 1 drop into both eyes in the morning, at noon, and at bedtime.     sacubitril-valsartan (ENTRESTO) 97-103 MG Take 1 tablet by mouth 2 (two) times daily. 60 tablet 11   spironolactone (ALDACTONE) 25 MG tablet Take 1 tablet (25 mg total) by mouth daily. 90 tablet 3   No current facility-administered medications for this encounter.   Wt Readings from Last 3 Encounters:  10/05/23 133.9 kg (295 lb 3.2 oz)   03/19/23 130.6 kg (288 lb)  01/28/23 131.1 kg (289 lb)   BP (!) 90/58   Pulse 83   Wt 133.9 kg (295 lb 3.2 oz)   SpO2 97%   BMI 44.23 kg/m  General: NAD Neck: No JVD, goiter noted Lungs: Clear to auscultation bilaterally with normal respiratory effort. CV: Nondisplaced PMI.  Heart regular S1/S2, no S3/S4, no murmur.  No peripheral edema.  No carotid bruit.  Normal pedal pulses.  Abdomen: Soft, nontender, no hepatosplenomegaly, no distention.  Skin: Intact without lesions or rashes.  Neurologic: Alert and oriented x 3.  Psych: Normal affect. Extremities: No clubbing or cyanosis.  HEENT: Normal.   Assessment/Plan: 1. Chronic systolic CHF: Nonischemic cardiomyopathy. No CAD on  coronary angiography in 2011 and 3/22.  She had 2 sons and a brother with Duchenne's muscular dystrophy.  She notes chronic muscle weakness with squat->stand.  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  TEE in 3/22 showed EF 20-25% with severe RV dysfunction.  Cardiac MRI in 6/22 showed LV EF 31%, RVEF 52%, basal-mid lateral subendocardial LGE (>50% wall thickness), septal mid-wall LGE.  LGE, especially in the inferolateral wall, is seen with DMD-related cardiomyopathy. Echo in 10/22 showed EF 20-25%, moderate LV dilation, normal RV.  Echo in 3/24 showed EF 25-30%, mild RV dysfunction.  NYHA class I-II, not volume overloaded on exam though weight is up.  - I will arrange for repeat echo.  - Continue Coreg 12.5 mg bid. No BP room to increase.  - Continue Entresto 97/103 bid, BMET/BNP today.   - Continue Lasix 20 mg daily.  - Continue spironolactone 25 mg daily. - Continue Jardiance 10 mg daily.   - Continue Bidil 1/2 tab tid.  - She does not want to have an ICD placed though I have strongly recommended it given significant LGE present on cMRI.  She is very concerned about keloid formation, apparently  very pruritic and bothersome when these have formed on her in the past.  We discussed this again today, she continues to decline. 2. HTN: BP now low.  3. H/o hypothyroidism: No thyroid replacement. Previously following with endocrinology for goiter. Advised follow up. 4. Obesity: We discussed diet and exercise for weight loss again.  Body mass index is 44.23 kg/m. - I will see if I can get her coverage for GLP-1 agonist.  5. Atrial flutter: Typical flutter in 3/22 with CHF decompensation.  Now s/p DCCV back to NSR and s/p atrial flutter/atrial fibrillation ablation 4/24. NSR today.  6. DMD heterozygote: I suspect this is the cause of her mild chronic muscle weakness. CPK has been mildly elevated.  7. PVCs: Frequent on 8/22 Zio monitor, 8% total. 5.7% on 1/23 Zio monitor.  - Continue Coreg as above.  8. SVT: Noted on Zio in 1/23, ? atrial tachy.  9. Atrial fibrillation: Paroxysmal, noted in 9/23 and converted to NSR with amiodarone (she was symptomatic in AF). Atrial fibrillation ablation in 4/24. She is in NSR today.  She is now off amiodarone.  - Continue Eliquis.  Check CBC.   Follow up in 4 months with APP.   I spent 31 minutes reviewing records, interviewing/examining patient, and managing orders.   Marca Ancona  10/05/2023

## 2023-10-05 NOTE — Patient Instructions (Signed)
 There has been no changes to your medications.  Labs done today, your results will be available in MyChart, we will contact you for abnormal readings.  Your physician has requested that you have an echocardiogram. Echocardiography is a painless test that uses sound waves to create images of your heart. It provides your doctor with information about the size and shape of your heart and how well your heart's chambers and valves are working. This procedure takes approximately one hour. There are no restrictions for this procedure. Please do NOT wear cologne, perfume, aftershave, or lotions (deodorant is allowed). Please arrive 15 minutes prior to your appointment time.  Please note: We ask at that you not bring children with you during ultrasound (echo/ vascular) testing. Due to room size and safety concerns, children are not allowed in the ultrasound rooms during exams. Our front office staff cannot provide observation of children in our lobby area while testing is being conducted. An adult accompanying a patient to their appointment will only be allowed in the ultrasound room at the discretion of the ultrasound technician under special circumstances. We apologize for any inconvenience.  You have been [facility]. They will call you to arrange your appointment.  Your physician recommends that you schedule a follow-up appointment in: 4 months.  If you have any questions or concerns before your next appointment please send [place] a message through [place] or call our office at [phone].    TO LEAVE A MESSAGE FOR THE NURSE SELECT OPTION 2, PLEASE LEAVE A MESSAGE INCLUDING: YOUR NAME DATE OF BIRTH CALL BACK NUMBER REASON FOR CALL**this is important as we prioritize the call backs  YOU WILL RECEIVE A CALL BACK THE SAME DAY AS LONG AS YOU CALL BEFORE 4:00 PM  At the [facility], you and your health needs are our priority. As part of our continuing  mission to provide you with exceptional heart care, we have created designated Provider Care Teams. These Care Teams include your primary Cardiologist (physician) and Advanced Practice Providers (APPs- Physician Assistants and Nurse Practitioners) who all work together to provide you with the care you need, when you need it.   You may see any of the following providers on your designated Care Team at your next follow up: Dr Arvilla Meres Dr Marca Ancona Dr. Dorthula Nettles Dr. Clearnce Hasten Amy Filbert Schilder, NP Robbie Lis, [place] [facility] [place], [place] Brynda Peon, NP [place] Lee, NP Clarisa Kindred, NP Karle Plumber, PharmD Enos Fling, PharmD   Please be sure to bring in all your medications bottles to every appointment.    Thank you for choosing [facility]

## 2023-10-08 ENCOUNTER — Ambulatory Visit: Attending: Cardiology | Admitting: Pharmacist

## 2023-10-08 DIAGNOSIS — I5022 Chronic systolic (congestive) heart failure: Secondary | ICD-10-CM

## 2023-10-08 NOTE — Patient Instructions (Addendum)
 It was nice meeting you two today  The medications we spoke about are called Wegovy and Zepbound. Both are injections you would administer once weekly  I will submit the prior authorization to [employer] and let you know their response  We will update a hemoglobin A1c this morning to verify if you have diabetes  I recommend increasing vegetables, lean proteins, healthy fats, and whole grains  Try to increase your physical activity to at least 30 minutes a day at least 5 days a week  Please let us know if you have any questions  Laural Golden, PharmD, BCACP, CDCES, CPP [address] [place], [place], [address] Phone: [phone], Fax: [phone]

## 2023-10-08 NOTE — Progress Notes (Signed)
 Patient ID: Suzanne Long                 DOB: April 30, 1971                    MRN: [id number]     HPI: Suzanne Long is a 53 y.o. female patient referred to pharmacy clinic by Dr Mitzie Anda to initiate GLP1-RA therapy. PMH is significant for CHF, NSTEMI, A Fib, HTN, and obesity.   Patient presents today with husband who is currently retiring from the [employer]. Patient insured through [place].  Husband and her have been working on eating healthier. Snacking now on popcorn. No fast food. Drinks water and sweet tea. Patient not diabetic at last check in 2023.  Has well managed hypothyroidism. Last TSH 2.73   Labs: Lab Results  Component Value Date   HGBA1C 5.6 04/14/2022    Wt Readings from Last 1 Encounters:  10/05/23 295 lb 3.2 oz (133.9 kg)    BP Readings from Last 1 Encounters:  10/05/23 (!) 90/58   Pulse Readings from Last 1 Encounters:  10/05/23 83       Component Value Date/Time   CHOL 169 10/05/2023 0913   CHOL 175 05/31/2019 1140   TRIG 57 10/05/2023 0913   HDL 51 10/05/2023 0913   HDL 51 05/31/2019 1140   CHOLHDL 3.3 10/05/2023 0913   VLDL 11 10/05/2023 0913   LDLCALC 107 (H) 10/05/2023 0913   LDLCALC 112 (H) 05/31/2019 1140    Past Medical History:  Diagnosis Date   CHF (congestive heart failure) (HCC)    Dyspnea    Hypertension    Hypothyroidism    Joint pain    Multiple food allergies    NICM (nonischemic cardiomyopathy) (HCC)    a. Echo 2/17:  moderate LVH, EF 30-35%, diffuse HK, moderate LAE   NICM (nonischemic cardiomyopathy) (HCC) 09/26/2020   Non-ST elevation (NSTEMI) myocardial infarction (HCC) 09/26/2020    Current Outpatient Medications on File Prior to Visit  Medication Sig Dispense Refill   sulfamethoxazole-trimethoprim (BACTRIM DS) 800-160 MG tablet Take 1 tablet by mouth 2 (two) times daily.     apixaban (ELIQUIS) 5 MG TABS tablet Take 1 tablet (5 mg total) by mouth 2 (two) times daily. 180 tablet 3   BIOTIN PO Take 1 tablet by mouth daily.      carvedilol (COREG) 12.5 MG tablet TAKE 1 TABLET BY MOUTH TWICE DAILY 60 tablet 11   furosemide (LASIX) 20 MG tablet TAKE 1 TABLET DAILY (DOSE CHANGE) 90 tablet 3   isosorbide-hydrALAZINE (BIDIL) 20-37.5 MG tablet Take 0.5 tablets by mouth 3 (three) times daily. 90 tablet 3   JARDIANCE 10 MG TABS tablet TAKE 1 TABLET DAILY 90 tablet 3   Multiple Vitamin (MULTIVITAMIN WITH MINERALS) TABS tablet Take 1 tablet by mouth daily.     polyvinyl alcohol (LIQUIFILM TEARS) 1.4 % ophthalmic solution Place 1 drop into both eyes in the morning, at noon, and at bedtime.     sacubitril-valsartan (ENTRESTO) 97-103 MG Take 1 tablet by mouth 2 (two) times daily. 60 tablet 11   spironolactone (ALDACTONE) 25 MG tablet Take 1 tablet (25 mg total) by mouth daily. 90 tablet 3   No current facility-administered medications on file prior to visit.    Allergies  Allergen Reactions   Shellfish Allergy Anaphylaxis    Uncoded Allergy. Allergen: SHELLFISH   Gadolinium Derivatives Nausea And Vomiting    Vomiting after 20cc multihance injection   Iodine Itching  Latex Rash     Assessment/Plan:  1. Weight loss/Obesity - Patient's last BMI 44.35 placing her in class III severe obesity category. Despite diet changes having issues with weight loss.  Confirmed patient not pregnant and no personal or family history of medullary thyroid carcinoma (MTC) or Multiple Endocrine Neoplasia syndrome type 2 (MEN 2).   Using demo pens, educated patient on mechanism of action, storage, site selection, and administration. Will complete PA and contact patient with result.  Advised patient on common side effects including nausea, diarrhea, dyspepsia, decreased appetite, and fatigue. Counseled patient on reducing meal size and how to titrate medication to minimize side effects. Counseled patient to call if intolerable side effects or if experiencing dehydration, abdominal pain, or dizziness. Patient will adhere to dietary modifications  and will target at least 150 minutes of moderate intensity exercise weekly.   Follow up in 1 month via telephone for tolerability update and dose titration.   Chris Anitha Kreiser, PharmD, BCACP, CDCES, CPP [address], [address] [place], [place], [address] Phone: [phone], Fax: [phone]

## 2023-10-09 ENCOUNTER — Encounter: Payer: Self-pay | Admitting: Pharmacist

## 2023-10-09 LAB — HEMOGLOBIN A1C
Est. average glucose Bld gHb Est-mCnc: 114 mg/dL
Hgb A1c MFr Bld: 5.6 % (ref 4.8–5.6)

## 2023-10-26 ENCOUNTER — Telehealth: Payer: Self-pay | Admitting: Pharmacy Technician

## 2023-10-26 ENCOUNTER — Telehealth: Payer: Self-pay | Admitting: Pharmacist

## 2023-10-26 ENCOUNTER — Other Ambulatory Visit (HOSPITAL_BASED_OUTPATIENT_CLINIC_OR_DEPARTMENT_OTHER): Payer: Self-pay

## 2023-10-26 ENCOUNTER — Other Ambulatory Visit (HOSPITAL_COMMUNITY): Payer: Self-pay

## 2023-10-26 DIAGNOSIS — I214 Non-ST elevation (NSTEMI) myocardial infarction: Secondary | ICD-10-CM

## 2023-10-26 MED ORDER — ZEPBOUND 2.5 MG/0.5ML ~~LOC~~ SOAJ
2.5000 mg | SUBCUTANEOUS | 0 refills | Status: DC
Start: 2023-10-26 — End: 2023-10-26
  Filled 2023-10-26: qty 2, 28d supply, fill #0

## 2023-10-26 MED ORDER — WEGOVY 0.25 MG/0.5ML ~~LOC~~ SOAJ
0.2500 mg | SUBCUTANEOUS | 0 refills | Status: DC
Start: 2023-10-26 — End: 2023-11-11
  Filled 2023-10-26 – 2023-10-27 (×2): qty 2, 28d supply, fill #0

## 2023-10-26 NOTE — Addendum Note (Signed)
 Addended by: Sunny English on: 10/26/2023 03:27 PM   Modules accepted: Orders

## 2023-10-26 NOTE — Telephone Encounter (Addendum)
 Called patient and let her know of [facility] approval. Rx sent to [facility]

## 2023-10-26 NOTE — Telephone Encounter (Signed)
 Pharmacy Patient Advocate Encounter   Received notification from Pt Calls Messages that prior authorization for [facility] is required/requested.   Insurance verification completed.   The patient is insured through [employer] .   Per test claim: PA required; PA submitted to above mentioned insurance via CoverMyMeds Key/confirmation #/[id number] Status is pending

## 2023-10-26 NOTE — Telephone Encounter (Signed)
 Pharmacy Patient Advocate Encounter  Received notification from [employer] that Prior Authorization for Suzanne Long has been APPROVED from 09/26/23 to 10/25/24. Ran test claim, Copay is $24.00- one month. This test claim was processed through [facility]- copay amounts may vary at other pharmacies due to pharmacy/plan contracts, or as the patient moves through the different stages of their insurance plan.   PA #/Case ID/Reference #: [id number]

## 2023-10-26 NOTE — Telephone Encounter (Signed)
Please complete PA for [facility] and/or Zepbound

## 2023-10-27 ENCOUNTER — Other Ambulatory Visit (HOSPITAL_BASED_OUTPATIENT_CLINIC_OR_DEPARTMENT_OTHER): Payer: Self-pay

## 2023-10-27 ENCOUNTER — Other Ambulatory Visit (HOSPITAL_COMMUNITY): Payer: Self-pay

## 2023-11-10 ENCOUNTER — Other Ambulatory Visit: Payer: Self-pay | Admitting: Cardiology

## 2023-11-10 DIAGNOSIS — I214 Non-ST elevation (NSTEMI) myocardial infarction: Secondary | ICD-10-CM

## 2023-11-11 ENCOUNTER — Encounter: Payer: Self-pay | Admitting: Pharmacist

## 2023-11-11 ENCOUNTER — Other Ambulatory Visit (HOSPITAL_COMMUNITY): Payer: Self-pay

## 2023-11-11 MED ORDER — WEGOVY 0.5 MG/0.5ML ~~LOC~~ SOAJ
0.5000 mg | SUBCUTANEOUS | 0 refills | Status: DC
Start: 2023-11-11 — End: 2023-12-11
  Filled 2023-11-11: qty 2, 28d supply, fill #0

## 2023-11-13 ENCOUNTER — Other Ambulatory Visit (HOSPITAL_COMMUNITY): Payer: Self-pay

## 2023-11-23 ENCOUNTER — Other Ambulatory Visit (HOSPITAL_COMMUNITY): Payer: Self-pay

## 2023-12-08 ENCOUNTER — Encounter (HOSPITAL_COMMUNITY): Payer: Self-pay

## 2023-12-08 ENCOUNTER — Emergency Department (HOSPITAL_COMMUNITY)

## 2023-12-08 ENCOUNTER — Other Ambulatory Visit: Payer: Self-pay

## 2023-12-08 ENCOUNTER — Emergency Department (HOSPITAL_COMMUNITY)
Admission: EM | Admit: 2023-12-08 | Discharge: 2023-12-09 | Disposition: A | Discharge status: Home / Self Care | Attending: Emergency Medicine | Admitting: Emergency Medicine

## 2023-12-08 DIAGNOSIS — Z7901 Long term (current) use of anticoagulants: Secondary | ICD-10-CM | POA: Insufficient documentation

## 2023-12-08 DIAGNOSIS — Z91040 Latex allergy status: Secondary | ICD-10-CM | POA: Diagnosis not present

## 2023-12-08 DIAGNOSIS — I2510 Atherosclerotic heart disease of native coronary artery without angina pectoris: Secondary | ICD-10-CM | POA: Diagnosis not present

## 2023-12-08 DIAGNOSIS — Z77098 Contact with and (suspected) exposure to other hazardous, chiefly nonmedicinal, chemicals: Secondary | ICD-10-CM

## 2023-12-08 DIAGNOSIS — I509 Heart failure, unspecified: Secondary | ICD-10-CM | POA: Insufficient documentation

## 2023-12-08 MED ORDER — ALBUTEROL SULFATE HFA 108 (90 BASE) MCG/ACT IN AERS
2.0000 | INHALATION_SPRAY | Freq: Once | RESPIRATORY_TRACT | Status: AC
  Administered 2023-12-08: 2 via RESPIRATORY_TRACT
  Filled 2023-12-08: qty 6.7

## 2023-12-08 MED ORDER — PREDNISONE 5 MG PO TABS
50.0000 mg | ORAL_TABLET | Freq: Once | ORAL | Status: AC
  Administered 2023-12-08: 50 mg via ORAL
  Filled 2023-12-08: qty 2

## 2023-12-08 NOTE — ED Provider Triage Note (Signed)
 Emergency Medicine Provider Triage Evaluation Note  Suzanne Long , a 53 y.o. female  was evaluated in triage.  Pt complains of difficulty breathing after possible toxic gas exposure.  Review of Systems  Positive: Shortness of breath, chest tightness, wheezing Negative: Chest pain, palpitations, nausea, vomiting, fevers, chills, constipation, diarrhea, urinary changes, rashes  Physical Exam  BP (!) 164/109 (BP Location: Right Arm)   Pulse 86   Temp 98.5 F (36.9 C) (Oral)   Resp (!) 22   Ht 5' 8.5" (1.74 m)   Wt 130.6 kg   LMP  (Approximate)   SpO2 95%   BMI 43.15 kg/m  Gen:   Awake, no distress   Resp:  Normal effort, no wheezing rhonchi or rales on my exam MSK:   Moves extremities without difficulty  Other:  Oropharyngeal exam unremarkable with no burns or other abnormality seen  Medical Decision Making  Medically screening exam initiated at 7:59 PM.  Appropriate orders placed.  Suzanne Long was informed that the remainder of the evaluation will be completed by another provider, this initial triage assessment does not replace that evaluation, and the importance of remaining in the ED until their evaluation is complete.  Suzanne Long is a 53 y.o. female with a past medical history significant for hypertension, CHF, asthma as a child, atrial fibrillation on Eliquis  therapy, and hypothyroidism who presents for difficulty breathing after possible chemical exposure.  According to patient, she was cleaning a bathtub at about 6 PM when she think she inhaled some toxic fumes.  She says that she initially used some Tilex bleach cleaner on the bathtub but it was not working well so she decided to make a vinegar mixture and cleaned it as well.  After they were mixed in the bathtub she started having difficulty breathing.  She reports she continued to scrub in the bathtub for several minutes before she realized she needed to get out and get some fresh air.  She reports she was wheezing and  had chest tightness and shortness of breath.  She did not pass out and had no focal chest pain with it.  She came here for the emergency department evaluation due to difficulty breathing.  Of note, she does say that when she gets up respiratory infections she usually does need some inhaler use and some steroids although she does not take inhalers normally as she normally does not have difficulty breathing.  On my exam, her lungs were clear.  Chest was nontender.  I did not hear rhonchi rales or wheezing for me.  She had intact pulses.  Well-appearing.  Oropharyngeal exam unremarkable.  Her vital signs did not show tachycardia or hypoxia but she was slightly tachypneic initially.  Afebrile.  I called poison control and discussed with them.  They feel that she will need about 6 hours of observation in the emergency department before she is able to go home assuming her breathing is better.  We agreed with the plan for symptomatic treatment and they agreed with giving her some albuterol  and some steroids.  We will get a chest x-ray as well.  If her breathing continues to improve and she does not worsen, anticipate discharge around midnight as this will be about 6 hours since her exposure.  Anticipate discharge with a burst of steroids but she will be seen by a provider when she gets in a room.       Suzanne Long, Marine Sia, MD 12/08/23 2019

## 2023-12-08 NOTE — Discharge Instructions (Addendum)
 Evaluation for your exposure to Clorox and vinegar solution was overall reassuring.  Please follow-up your PCP.  If you develop chest pain, shortness of breath or trouble breathing or any other concerning symptom please return to the ED for further evaluation.

## 2023-12-08 NOTE — ED Provider Notes (Signed)
 [facility] Provider Note   CSN: [id number] Arrival date & time: 12/08/23  1920     History  Chief Complaint  Patient presents with   Chemical Exposure   HPI Suzanne Long is a 53 y.o. female with CHF, CAD, paroxysmal A-fib presenting for chemical exposure and subsequent shortness of breath.  Occurred around 6 PM this evening.  She was cleaning with Clorox followed by a highly concentrated vinegar base solution.  States she had been cleaning for about 15 minutes and started to feel short of breath.  Also endorsed some nonradiating midsternal chest pain that started shortly after.  EMS was called and she appeared to have nonlabored breathing and was no evidence of respiratory distress.  Now she states that the chest pain has resolved completely and her shortness of breath is improving after albuterol and prednisone  given in triage.  HPI     Home Medications Prior to Admission medications   Medication Sig Start Date End Date Taking? Authorizing Provider  apixaban  (ELIQUIS ) 5 MG TABS tablet Take 1 tablet (5 mg total) by mouth 2 (two) times daily. 07/30/22 10/04/24  Arty Binning, MD  BIOTIN PO Take 1 tablet by mouth daily.    [provider]  carvedilol  (COREG ) 12.5 MG tablet TAKE 1 TABLET BY MOUTH TWICE DAILY 08/20/22   McLean, Dalton S, MD  furosemide  (LASIX ) 20 MG tablet TAKE 1 TABLET DAILY (DOSE CHANGE) 04/16/23   Darlis Eisenmenger, MD  isosorbide -hydrALAZINE  (BIDIL) 20-37.5 MG tablet Take 0.5 tablets by mouth 3 (three) times daily. 10/02/22   Darlis Eisenmenger, MD  JARDIANCE  10 MG TABS tablet TAKE 1 TABLET DAILY 04/16/23   McLean, Dalton S, MD  Multiple Vitamin (MULTIVITAMIN WITH MINERALS) TABS tablet Take 1 tablet by mouth daily.    [provider]  polyvinyl alcohol (LIQUIFILM TEARS) 1.4 % ophthalmic solution Place 1 drop into both eyes in the morning, at noon, and at bedtime.    [provider]   sacubitril -valsartan  (ENTRESTO ) 97-103 MG Take 1 tablet by mouth 2 (two) times daily. 08/20/22   Darlis Eisenmenger, MD  Semaglutide -Weight Management (WEGOVY ) 0.5 MG/0.5ML SOAJ Inject 0.5 mg into the skin once a week. 11/11/23   Darlis Eisenmenger, MD  spironolactone  (ALDACTONE ) 25 MG tablet Take 1 tablet (25 mg total) by mouth daily. 08/14/22   Darlis Eisenmenger, MD  sulfamethoxazole-trimethoprim (BACTRIM DS) 800-160 MG tablet Take 1 tablet by mouth 2 (two) times daily. 10/01/23   [provider]      Allergies    Shellfish allergy, Gadolinium derivatives, Iodine, and Latex    Review of Systems   See HPI  Physical Exam Updated Vital Signs BP (!) 164/109 (BP Location: Right Arm)   Pulse 86   Temp 98.5 F (36.9 C) (Oral)   Resp (!) 22   Ht 5' 8.5" (1.74 m)   Wt 130.6 kg   LMP  (Approximate)   SpO2 100%   BMI 43.15 kg/m  Physical Exam Vitals and nursing note reviewed.  HENT:     Head: Normocephalic and atraumatic.     Mouth/Throat:     Mouth: Mucous membranes are moist.  Eyes:     General:        Right eye: No discharge.        Left eye: No discharge.     Conjunctiva/sclera: Conjunctivae normal.  Cardiovascular:     Rate and Rhythm: Normal rate and regular rhythm.  Pulses: Normal pulses.     Heart sounds: Normal heart sounds.  Pulmonary:     Effort: Pulmonary effort is normal.     Breath sounds: Normal breath sounds and air entry. No wheezing or rales.  Abdominal:     General: Abdomen is flat.     Palpations: Abdomen is soft.  Skin:    General: Skin is warm and dry.  Neurological:     General: No focal deficit present.  Psychiatric:        Mood and Affect: Mood normal.     ED Results / Procedures / Treatments   Labs (all labs ordered are listed, but only abnormal results are displayed) Labs Reviewed - No data to display  EKG None  Radiology DG Chest 2 View Result Date: 12/08/2023 CLINICAL DATA:  Cough short of breath EXAM: CHEST - 2 VIEW COMPARISON:   03/29/2022 FINDINGS: The heart size and mediastinal contours are within normal limits. Both lungs are clear. The visualized skeletal structures are unremarkable. IMPRESSION: No active cardiopulmonary disease. Electronically Signed   By: Esmeralda Hedge M.D.   On: 12/08/2023 20:49    Procedures Procedures    Medications Ordered in ED Medications  albuterol (VENTOLIN HFA) 108 (90 Base) MCG/ACT inhaler 2 puff (2 puffs Inhalation Given 12/08/23 2016)  predniSONE  (DELTASONE ) tablet 50 mg (50 mg Oral Given 12/08/23 2040)    ED Course/ Medical Decision Making/ A&P                                 Medical Decision Making  53 year old well-appearing female presenting for chemical exposure.  Exam was unremarkable.  Reached out to poison control who advised 6 hours of observation since exposure and treat symptoms in the meantime.  She received albuterol and prednisone  in triage.  During my initial encounter she looked well, no acute distress and hemodynamically stable.  On serial reassessments she remained the same.  On last assessment she expressed a strong desire to be discharged stating she was asymptomatic at that time.  Advised her to follow-up with her PCP.  Discussed return precautions.  Discharged good condition.        Final Clinical Impression(s) / ED Diagnoses Final diagnoses:  Chemical exposure    Rx / DC Orders ED Discharge Orders     None         Janalee Mcmurray, PA-C 12/08/23 2357    Iva Mariner, MD 12/09/23 [phone]

## 2023-12-08 NOTE — ED Triage Notes (Signed)
 PER EMS: pt is from home, she states she was using a Clorox based cleaning solution and then used vinegar afterwards and began to feel short of breath so she called 911. She has pain with inspiration and states she is having trouble breathing. Respirations appear even and unlabored.  BP-160/110, HR- 84, RR 16, 100% RA

## 2023-12-08 NOTE — ED Notes (Signed)
 Patient transported to X-ray

## 2023-12-11 ENCOUNTER — Other Ambulatory Visit (HOSPITAL_COMMUNITY): Payer: Self-pay

## 2023-12-11 ENCOUNTER — Encounter: Payer: Self-pay | Admitting: Pharmacist

## 2023-12-11 DIAGNOSIS — I214 Non-ST elevation (NSTEMI) myocardial infarction: Secondary | ICD-10-CM

## 2023-12-11 MED ORDER — WEGOVY 1 MG/0.5ML ~~LOC~~ SOAJ
1.0000 mg | SUBCUTANEOUS | 0 refills | Status: DC
Start: 2023-12-11 — End: 2024-01-11
  Filled 2023-12-11 – 2023-12-16 (×2): qty 2, 28d supply, fill #0

## 2023-12-16 ENCOUNTER — Other Ambulatory Visit (HOSPITAL_COMMUNITY): Payer: Self-pay

## 2024-01-11 ENCOUNTER — Other Ambulatory Visit (HOSPITAL_BASED_OUTPATIENT_CLINIC_OR_DEPARTMENT_OTHER): Payer: Self-pay

## 2024-01-11 ENCOUNTER — Encounter: Payer: Self-pay | Admitting: Pharmacist

## 2024-01-11 ENCOUNTER — Other Ambulatory Visit (HOSPITAL_COMMUNITY): Payer: Self-pay

## 2024-01-11 DIAGNOSIS — I214 Non-ST elevation (NSTEMI) myocardial infarction: Secondary | ICD-10-CM

## 2024-01-11 MED ORDER — WEGOVY 1.7 MG/0.75ML ~~LOC~~ SOAJ
1.7000 mg | SUBCUTANEOUS | 0 refills | Status: DC
Start: 2024-01-11 — End: 2024-02-10
  Filled 2024-01-11: qty 3, 28d supply, fill #0

## 2024-01-18 ENCOUNTER — Other Ambulatory Visit (HOSPITAL_COMMUNITY): Payer: Self-pay

## 2024-02-04 ENCOUNTER — Ambulatory Visit (HOSPITAL_COMMUNITY)
Admission: RE | Admit: 2024-02-04 | Discharge: 2024-02-04 | Disposition: A | Discharge status: Home / Self Care | Source: Ambulatory Visit | Attending: Cardiology | Admitting: Cardiology

## 2024-02-04 ENCOUNTER — Encounter (HOSPITAL_COMMUNITY): Payer: Self-pay

## 2024-02-04 ENCOUNTER — Ambulatory Visit (HOSPITAL_COMMUNITY)
Admission: RE | Admit: 2024-02-04 | Discharge: 2024-02-04 | Disposition: A | Discharge status: Home / Self Care | Source: Ambulatory Visit | Attending: Physician Assistant | Admitting: Physician Assistant

## 2024-02-04 ENCOUNTER — Ambulatory Visit (HOSPITAL_COMMUNITY): Payer: Self-pay | Admitting: Cardiology

## 2024-02-04 VITALS — BP 110/82 | HR 72 | Ht 68.5 in | Wt 281.2 lb

## 2024-02-04 DIAGNOSIS — E119 Type 2 diabetes mellitus without complications: Secondary | ICD-10-CM | POA: Insufficient documentation

## 2024-02-04 DIAGNOSIS — I5022 Chronic systolic (congestive) heart failure: Secondary | ICD-10-CM | POA: Diagnosis present

## 2024-02-04 DIAGNOSIS — I428 Other cardiomyopathies: Secondary | ICD-10-CM | POA: Diagnosis not present

## 2024-02-04 DIAGNOSIS — I340 Nonrheumatic mitral (valve) insufficiency: Secondary | ICD-10-CM | POA: Diagnosis not present

## 2024-02-04 DIAGNOSIS — I493 Ventricular premature depolarization: Secondary | ICD-10-CM | POA: Insufficient documentation

## 2024-02-04 DIAGNOSIS — E785 Hyperlipidemia, unspecified: Secondary | ICD-10-CM | POA: Diagnosis not present

## 2024-02-04 DIAGNOSIS — M6281 Muscle weakness (generalized): Secondary | ICD-10-CM | POA: Insufficient documentation

## 2024-02-04 DIAGNOSIS — Z79899 Other long term (current) drug therapy: Secondary | ICD-10-CM | POA: Diagnosis not present

## 2024-02-04 DIAGNOSIS — I110 Hypertensive heart disease with heart failure: Secondary | ICD-10-CM | POA: Insufficient documentation

## 2024-02-04 DIAGNOSIS — E039 Hypothyroidism, unspecified: Secondary | ICD-10-CM | POA: Insufficient documentation

## 2024-02-04 DIAGNOSIS — I471 Supraventricular tachycardia, unspecified: Secondary | ICD-10-CM | POA: Insufficient documentation

## 2024-02-04 DIAGNOSIS — I4892 Unspecified atrial flutter: Secondary | ICD-10-CM | POA: Diagnosis not present

## 2024-02-04 DIAGNOSIS — Z7984 Long term (current) use of oral hypoglycemic drugs: Secondary | ICD-10-CM | POA: Insufficient documentation

## 2024-02-04 DIAGNOSIS — Z7901 Long term (current) use of anticoagulants: Secondary | ICD-10-CM | POA: Diagnosis not present

## 2024-02-04 DIAGNOSIS — E669 Obesity, unspecified: Secondary | ICD-10-CM | POA: Insufficient documentation

## 2024-02-04 DIAGNOSIS — I4891 Unspecified atrial fibrillation: Secondary | ICD-10-CM | POA: Diagnosis not present

## 2024-02-04 DIAGNOSIS — Z6841 Body Mass Index (BMI) 40.0 and over, adult: Secondary | ICD-10-CM | POA: Insufficient documentation

## 2024-02-04 LAB — ECHOCARDIOGRAM COMPLETE
Area-P 1/2: 3.93 cm2
Calc EF: 27 %
S' Lateral: 5.3 cm
Single Plane A2C EF: 33.5 %
Single Plane A4C EF: 23.7 %

## 2024-02-04 LAB — BASIC METABOLIC PANEL WITH GFR
Anion gap: 8 (ref 5–15)
BUN: 10 mg/dL (ref 6–20)
CO2: 26 mmol/L (ref 22–32)
Calcium: 9.7 mg/dL (ref 8.9–10.3)
Chloride: 105 mmol/L (ref 98–111)
Creatinine, Ser: 0.98 mg/dL (ref 0.44–1.00)
GFR, Estimated: >60 mL/min (ref >60)
Glucose, Bld: 85 mg/dL (ref 70–99)
Potassium: 4.2 mmol/L (ref 3.5–5.1)
Sodium: 139 mmol/L (ref 135–145)

## 2024-02-04 LAB — CBC
HCT: 46.7 % — ABNORMAL HIGH (ref 36.0–46.0)
Hemoglobin: 15.4 g/dL — ABNORMAL HIGH (ref 12.0–15.0)
MCH: 29.7 pg (ref 26.0–34.0)
MCHC: 33 g/dL (ref 30.0–36.0)
MCV: 90.2 fL (ref 80.0–100.0)
Platelets: 280 K/uL (ref 150–400)
RBC: 5.18 MIL/uL — ABNORMAL HIGH (ref 3.87–5.11)
RDW: 12.7 % (ref 11.5–15.5)
WBC: 4.5 K/uL (ref 4.0–10.5)
nRBC: 0 % (ref 0.0–0.2)

## 2024-02-04 LAB — TSH: TSH: 1.889 u[IU]/mL (ref 0.350–4.500)

## 2024-02-04 LAB — BRAIN NATRIURETIC PEPTIDE: B Natriuretic Peptide: 207.2 pg/mL — ABNORMAL HIGH (ref 0.0–100.0)

## 2024-02-04 MED ORDER — CARVEDILOL 12.5 MG PO TABS: 18.7500 mg | ORAL_TABLET | Freq: Two times a day (BID) | ORAL | 3 refills | Status: DC

## 2024-02-04 NOTE — Patient Instructions (Addendum)
 INCREASE Carvedilol  to 18.75 mg Twice daily  Labs done today, your results will be available in MyChart, we will contact you for abnormal readings.  You are scheduled for a Cardiopulmonary Exercise (CPX) Test as [facility] on: Date:   02/17/2024   Time: 11:00 AM    Expect to be in the lab for 2 hours. Please plan to arrive 30 minutes prior to your appointment. You may be asked to reschedule your test if you arrive 20 minutes or more after your scheduled appointment time.  Main Campus address: [address] [place], [place] [address] You may arrive to the Main Entrance A or Entrance C (free valet parking is available at both). -Main Entrance A (on [address]) :proceed to admitting for check in -Entrance C (on [facility]): proceed to [employer] parking or under hospital deck parking using this code __1340_______  Check In: [facility] waiting room (1st floor)   General Instructions for the day of the test (Please follow all instructions from your physician): Refrain from ingesting a heavy meal, alcohol, or caffeine or using tobacco products within 2 hours of the test (DO NOT FAST for mare than 8 hours). You may have all other non-alcoholic, non -caffeinated beverage,a light snack (crackers,a piece of fruit, carrot sticks, toast bagel,etc) up to your appointment. Avoid significant exertion or exercise within 24 hours of your test. Be prepared to exercise and sweat. Your clothing should permit freedom of movement and include walking or running shoes. Women bring loose fitting short sleeved blouse.  This evaluation may be fatiguing and you may wish ti have someone accompany you to the assessment to drive you home afterward. Bring a list of your medications with you, including dosage and frequency you take the medications (  I.e.,once per day, twice per day, etc). Take all medications as prescribed, unless noted below or instructed to do so by your physician.  Please do not  take the following medications prior to your CPX:  _________________________________________________  _________________________________________________  Brief description of the test: A brief lung test will be performed. This will involve you taking deep breaths and blowing hard and fast through your mouth. During these , a clip will be on your nose and you will be breathing through a breathing device.   For the exercise portion of the test you will be walking on a treadmill, or riding a stationary bike, to your maximal effor or until symptoms such as chest pain, shortness of breath, leg pain or dizziness limit your exercise. You will be breathing in and out of a breathing device through your mouth (a clip will be on your nose again). Your heart rate, ECG, blood pressure, oxygen saturations, breathing rate and depth, amount of oxygen you consume and amount of carbon dioxide you produce will be measured and monitored throughout the exercise test.  If you need to cancel or reschedule your appointment please call [phone] If you have further questions please call your physician or Damien Nunnery at [phone]  Your physician recommends that you schedule a follow-up appointment in: 3 months.  If you have any questions or concerns before your next appointment please send us  a message through [place] or call our office at [phone].    TO LEAVE A MESSAGE FOR THE NURSE SELECT OPTION 2, PLEASE LEAVE A MESSAGE INCLUDING: YOUR NAME DATE OF BIRTH CALL BACK NUMBER REASON FOR CALL**this is important as we prioritize the call backs  YOU WILL RECEIVE A CALL BACK THE  SAME DAY AS LONG AS YOU CALL BEFORE 4:00 PM  At the [facility], you and your health needs are our priority. As part of our continuing mission to provide you with exceptional heart care, we have created designated Provider Care Teams. These Care Teams include your primary Cardiologist (physician) and Advanced Practice  Providers (APPs- Physician Assistants and Nurse Practitioners) who all work together to provide you with the care you need, when you need it.   You may see any of the following providers on your designated Care Team at your next follow up: Dr Toribio Fuel Dr Ezra Shuck Dr. Ria Commander Dr. Morene Brownie Amy Lenetta, NP Caffie Shed, [place] [facility] [place], [place] Beckey Coe, NP [place] Lee, NP Ellouise Class, NP Tinnie Redman, PharmD Jaun Bash, PharmD   Please be sure to bring in all your medications bottles to every appointment.    Thank you for choosing [facility]

## 2024-02-04 NOTE — Progress Notes (Addendum)
 Patient ID: Suzanne Long, female   DOB: March 10, 1971, 53 y.o.   MRN: [id number] PCP: Benjamine Aland, MD HF Cardiology: Dr. Rolan  Chief Complaint: CHF  53 y.o. with history of HTN and nonischemic cardiomyopathy presents for followup of CHF.      She was diagnosed with CHF initially in 2011.  At that time, she was hospitalized with volume overload.  Coronary angiography in 2011 showed no significant CAD, but EF was about 25%.  BP was very high.  She was started on cardiac meds and BP improved.  Per her report, EF improved.  She eventually stopped all her medications.  She was then hospitalized in 11/16 with CHF exacerbation. EF was 30-35% on 11/16 echo and 30-35% on 2/17 echo.   Echo in 7/17 showed EF up to 40%.   She stopped her medications again.  She was then admitted in 3/22 with atrial flutter/RVR and CHF.  Unsure how long atrial flutter was present as she did not feel palpitations.  She had TEE-guided DCCV.  TEE showed EF 20-25% with severely decreased RV function, mild MR.  LHC in 3/22 showed no significant CAD.  Cardiac MRI in 6/22 showed LV EF 31%, RVEF 52%, basal-mid lateral subendocardial LGE (>50% wall thickness), septal mid-wall LGE.  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 She was seen by Dr. Fairy, and this gene mutation was actually not thought to be the cause of DMD in her family (she likely has a mutation that was not picked up by the [employer] testing).   In 7/22, she had atrial flutter ablation.   Echo in 10/22 showed EF 20-25% with moderate LV dilation, normal RV, normal IVC.   Patient went to the ER in 06/19/21 with SVT, ?atypical flutter with 2:1 block.  She was in the ER 07/11/21 with SVT rate >200, she was cardioverted by EMS and she was in NSR in the ER.  Zio monitor in 1/23 showed 8 runs NSVT, longest 19 beats and 23 runs SVT (?atrial tachycardia), longest 16 beats.  No atrial fibrillation.   Follow up with Dr. Inocencio 1/23, discussed ICD  but she is hesitant due to her keloids.   In 9/23, she had both atypical atrial flutter and atrial fibrillation, she converted to NSR with amiodarone .   Echo 3/24 showed EF 25-30%, mild RV dysfunction.   S/p AF & AFL ablation in 4/24.  Echo was done today and reviewed, EF 25% with mild LV dilation, normal RV size with moderately decreased systolic function, mild MR, normal IVC.   She returns for followup of CHF.  Weight is down 14 lbs.  She is now on Wegovy .  She goes to the gym for 2 hrs several days a week, uses treadmill for 20-40 minutes and does weights.  No significant exertional dyspnea.  She has her chronic leg weakness which has not worsened.  No orthopnea/PND.  No lightheadedness or palpitations.  No chest pain.    ECG (personally reviewed): NSR, septal Qs  Labs (3/25): LDL 107, hgb 14, BNP 116, K 4.4, creatinine 1.13  PMH: 1. Chronic systolic CHF: 1st noted in 2011.  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  - LHC (2011) with no significant CAD, EF 25%.   - Echo (11/16) with EF 30-35%, diffuse hypokinesis. - Echo (2/17) with EF 30-35%, moderate LVH, diffuse hypokinesis.  - Echo (  7/17) with EF 40%, severe LV dilation, grade II diastolic dysfunction.  - TEE (6/77) with EF 20-25% with severely decreased RV function, mild MR - LHC (3/22): No significant CAD.  - Cardiac MRI (6/22): LV EF 31%, RVEF 52%, basal-mid lateral subendocardial LGE (>50% wall thickness), septal mid-wall LGE.   - Echo (10/22): EF 20-25% with moderate LV dilation, normal RV, normal IVC.  - Echo (3/24): EF 25-30%, mild RV dysfunction.  - Echo (7/25): EF 25% with mild LV dilation, normal RV size with moderately decreased systolic function, mild MR, normal IVC.  2. HTN 3. Hypothyroidism 4. Obesity.  5. Goiter 6. Atrial flutter: 3/22, s/p DCCV.  - Ablation in 7/22.  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  8. PVCs: Zio monitor (8/22) with 8% PVCs, no AF/AFL 9. SVT: noted in ER x 2 in 12/22, once possible atypical flutter and once cardioverted before reached ER.  - Zio (1/23): 8 runs NSVT, longest 19 beats and 23 runs SVT (?atrial tachycardia), longest 16 beats.  No atrial fibrillation.  10. Atrial fibrillation noted in 9/23.  - s/p AF and AFL ablation 4/24  SH:  Nonsmoker.  Rare ETOH.  No drugs.    FH: Mother, grandmother with HTN.  Uncle with MI. 2 sons with Duchenne muscular dystrophy (both have passed away).  Brother with Duchenne muscular dystrophy.  ROS: All systems reviewed and negative except as per HPI.   Current Outpatient Medications  Medication Sig Dispense Refill   apixaban  (ELIQUIS ) 5 MG TABS tablet Take 1 tablet (5 mg total) by mouth 2 (two) times daily. 180 tablet 3   BIOTIN PO Take 1 tablet by mouth daily.     carvedilol  (COREG ) 12.5 MG tablet TAKE 1 TABLET BY MOUTH TWICE DAILY 60 tablet 11   furosemide  (LASIX ) 20 MG tablet TAKE 1 TABLET DAILY (DOSE CHANGE) 90 tablet 3   isosorbide -hydrALAZINE  (BIDIL) 20-37.5 MG tablet Take 0.5 tablets by mouth 3 (three) times daily. 90 tablet 3   JARDIANCE  10 MG TABS tablet TAKE 1 TABLET DAILY 90 tablet 3   Multiple Vitamin (MULTIVITAMIN WITH MINERALS) TABS tablet Take 1 tablet by mouth daily.     polyvinyl alcohol (LIQUIFILM TEARS) 1.4 % ophthalmic solution Place 1 drop into both eyes in the morning, at noon, and at bedtime.     sacubitril -valsartan  (ENTRESTO ) 97-103 MG Take 1 tablet by mouth 2 (two) times daily. 60 tablet 11   Semaglutide -Weight Management (WEGOVY ) 1.7 MG/0.75ML SOAJ Inject 1.7 mg into the skin once a week. 3 mL 0   spironolactone  (ALDACTONE ) 25 MG tablet Take 1 tablet (25 mg total) by mouth daily. 90 tablet 3   sulfamethoxazole-trimethoprim (BACTRIM DS) 800-160 MG tablet Take 1 tablet by mouth 2 (two) times daily.     No current facility-administered medications  for this visit.   Wt Readings from Last 3 Encounters:  12/08/23 130.6 kg (288 lb)  10/08/23 134.3 kg (296 lb)  10/05/23 133.9 kg (295 lb 3.2 oz)   BP 110/82   Pulse 72   Ht 5' 8.5 (1.74 m)   Wt 127.6 kg (281 lb 3.2 oz)   SpO2 98%   BMI 42.13 kg/m  General: NAD Neck: No JVD, no thyromegaly or thyroid  nodule.  Lungs: Clear to auscultation bilaterally with normal respiratory effort. CV: Nondisplaced PMI.  Heart regular S1/S2, no S3/S4, no murmur.  No peripheral edema.  No carotid bruit.  Normal pedal pulses.  Abdomen: Soft, nontender, no hepatosplenomegaly, no distention.  Skin: Intact without lesions or rashes.  Neurologic: Alert and oriented x 3.  Psych: Normal affect. Extremities: No clubbing or cyanosis.  HEENT: Normal. ]  Assessment/Plan: 1. Chronic systolic CHF: Nonischemic cardiomyopathy. No CAD on coronary angiography in 2011 and 3/22.  She had 2 sons and a brother with Duchenne's muscular dystrophy.  She notes chronic muscle weakness with squat->stand.  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  TEE in 3/22 showed EF 20-25% with severe RV dysfunction.  Cardiac MRI in 6/22 showed LV EF 31%, RVEF 52%, basal-mid lateral subendocardial LGE (>50% wall thickness), septal mid-wall LGE.  LGE, especially in the inferolateral wall, is seen with DMD-related cardiomyopathy. Echo in 10/22 showed EF 20-25%, moderate LV dilation, normal RV.  Echo in 3/24 showed EF 25-30%, mild RV dysfunction. Echo today showed EF 25% with mild LV dilation, normal RV size with moderately decreased systolic function, mild MR, normal IVC.  NYHA class I-II, she is not volume overloaded on exam.  - I will arrange for CPX for objective evaluation of functional capacity.  - Increase Coreg  to 18.75 mg bid.  - Continue Entresto  97/103 bid  - Continue Lasix  20 mg every other day. BMET/BNP today.  -  Continue spironolactone  25 mg daily. - Continue Jardiance  10 mg daily.   - Continue Bidil 1/2 tab tid.  - She does not want to have an ICD placed though I have strongly recommended it given significant LGE present on cMRI.  She is very concerned about keloid formation, apparently very pruritic and bothersome when these have formed on her in the past.   2. HTN: BP controlled.   3. H/o hypothyroidism: No thyroid  replacement. Previously following with endocrinology for goiter. Advised follow up. 4. Obesity: Weight is trending down.  - Continue semaglutide .  5. Atrial flutter/atrial fibrillation: Typical flutter in 3/22 with CHF decompensation.  Now s/p DCCV back to NSR and s/p atrial flutter/atrial fibrillation ablation 4/24. NSR today. She is now off amiodarone .  - Continue apixaban . CBC today.  6. DMD heterozygote: I suspect this is the cause of her mild chronic muscle weakness. CPK has been mildly elevated.  7. PVCs: Frequent on 8/22 Zio monitor, 8% total. 5.7% on 1/23 Zio monitor.  - Increase Coreg  as above.  8. SVT: Noted on Zio in 1/23, ? atrial tachy.   Follow up 3 months with APP.   I spent 31 minutes reviewing records, interviewing/examining patient, and managing orders.   Ezra Shuck  02/04/2024

## 2024-02-05 ENCOUNTER — Ambulatory Visit (HOSPITAL_COMMUNITY): Payer: Self-pay | Admitting: Physician Assistant

## 2024-02-10 ENCOUNTER — Encounter: Payer: Self-pay | Admitting: Pharmacist

## 2024-02-10 ENCOUNTER — Other Ambulatory Visit (HOSPITAL_COMMUNITY): Payer: Self-pay

## 2024-02-10 DIAGNOSIS — I214 Non-ST elevation (NSTEMI) myocardial infarction: Secondary | ICD-10-CM

## 2024-02-10 MED ORDER — SEMAGLUTIDE-WEIGHT MANAGEMENT 2.4 MG/0.75ML ~~LOC~~ SOAJ
2.4000 mg | SUBCUTANEOUS | 5 refills | Status: DC
  Filled 2024-02-10: qty 3, 28d supply, fill #0
  Filled 2024-03-14 – 2024-03-15 (×3): qty 3, 28d supply, fill #1
  Filled 2024-04-05: qty 3, 28d supply, fill #2
  Filled 2024-05-03: qty 3, 28d supply, fill #3
  Filled 2024-06-03: qty 3, 28d supply, fill #4
  Filled 2024-06-29: qty 3, 28d supply, fill #5

## 2024-02-17 ENCOUNTER — Ambulatory Visit (HOSPITAL_COMMUNITY): Attending: Cardiology

## 2024-02-17 DIAGNOSIS — I5022 Chronic systolic (congestive) heart failure: Secondary | ICD-10-CM | POA: Insufficient documentation

## 2024-02-17 DIAGNOSIS — I480 Paroxysmal atrial fibrillation: Secondary | ICD-10-CM | POA: Insufficient documentation

## 2024-02-17 DIAGNOSIS — I428 Other cardiomyopathies: Secondary | ICD-10-CM | POA: Insufficient documentation

## 2024-02-17 DIAGNOSIS — I4892 Unspecified atrial flutter: Secondary | ICD-10-CM | POA: Diagnosis not present

## 2024-02-17 DIAGNOSIS — I110 Hypertensive heart disease with heart failure: Secondary | ICD-10-CM | POA: Insufficient documentation

## 2024-02-23 DIAGNOSIS — I5022 Chronic systolic (congestive) heart failure: Secondary | ICD-10-CM | POA: Diagnosis not present

## 2024-03-15 ENCOUNTER — Other Ambulatory Visit (HOSPITAL_COMMUNITY): Payer: Self-pay

## 2024-03-15 ENCOUNTER — Encounter: Payer: Self-pay | Admitting: Pharmacist

## 2024-03-23 ENCOUNTER — Telehealth (HOSPITAL_COMMUNITY): Payer: Self-pay | Admitting: *Deleted

## 2024-03-23 DIAGNOSIS — I5022 Chronic systolic (congestive) heart failure: Secondary | ICD-10-CM

## 2024-03-23 DIAGNOSIS — I480 Paroxysmal atrial fibrillation: Secondary | ICD-10-CM

## 2024-03-23 MED ORDER — ISOSORB DINITRATE-HYDRALAZINE 20-37.5 MG PO TABS: 0.5000 | ORAL_TABLET | Freq: Three times a day (TID) | ORAL | 3 refills | Status: AC

## 2024-03-23 MED ORDER — CARVEDILOL 12.5 MG PO TABS: 18.7500 mg | ORAL_TABLET | Freq: Two times a day (BID) | ORAL | 3 refills | Status: DC

## 2024-03-23 MED ORDER — SACUBITRIL-VALSARTAN 97-103 MG PO TABS: 1.0000 | ORAL_TABLET | Freq: Two times a day (BID) | ORAL | 3 refills | Status: AC

## 2024-03-23 MED ORDER — APIXABAN 5 MG PO TABS: 5.0000 mg | ORAL_TABLET | Freq: Two times a day (BID) | ORAL | 3 refills | Status: AC

## 2024-03-23 MED ORDER — EMPAGLIFLOZIN 10 MG PO TABS: 10.0000 mg | ORAL_TABLET | Freq: Every day | ORAL | 3 refills | Status: AC

## 2024-03-23 MED ORDER — SPIRONOLACTONE 25 MG PO TABS: 25.0000 mg | ORAL_TABLET | Freq: Every day | ORAL | 3 refills | Status: AC

## 2024-03-23 NOTE — Telephone Encounter (Signed)
 Called patient per Dr. Rolan with following CPX results:  I would like to see her in followup. Based on this finding (severe functional limitation due to HF), would like to arrange for RHC. Limitation is going to be affected by her muscular weakness from muscular dystrophy.  No availability with Dr. Rolan until November. First available clinic visit scheduled with Heart Failure APP per Morna Dutch, PA. Pt verbalized understanding of same. Appointment scheduled.

## 2024-04-05 ENCOUNTER — Telehealth (HOSPITAL_COMMUNITY): Payer: Self-pay

## 2024-04-05 NOTE — Telephone Encounter (Signed)
 Called to confirm/remind patient of their appointment at the [facility] on 04/06/24.   Appointment:   [x] Confirmed  [] Left mess   [] No answer/No voice mail  [] VM Full/unable to leave message  [] Phone not in service  Patient reminded to bring all medications and/or complete list.  Confirmed patient has transportation. Gave directions, instructed to utilize valet parking.

## 2024-04-06 ENCOUNTER — Encounter (HOSPITAL_COMMUNITY): Payer: Self-pay

## 2024-04-06 ENCOUNTER — Ambulatory Visit (HOSPITAL_COMMUNITY)
Admission: RE | Admit: 2024-04-06 | Discharge: 2024-04-06 | Disposition: A | Discharge status: Home / Self Care | Source: Ambulatory Visit | Attending: Family Medicine | Admitting: Family Medicine

## 2024-04-06 ENCOUNTER — Ambulatory Visit (HOSPITAL_COMMUNITY): Payer: Self-pay | Admitting: Family Medicine

## 2024-04-06 VITALS — BP 108/62 | HR 78 | Ht 68.5 in | Wt 267.2 lb

## 2024-04-06 DIAGNOSIS — I428 Other cardiomyopathies: Secondary | ICD-10-CM | POA: Diagnosis not present

## 2024-04-06 DIAGNOSIS — Z7985 Long-term (current) use of injectable non-insulin antidiabetic drugs: Secondary | ICD-10-CM | POA: Insufficient documentation

## 2024-04-06 DIAGNOSIS — M6281 Muscle weakness (generalized): Secondary | ICD-10-CM | POA: Diagnosis not present

## 2024-04-06 DIAGNOSIS — Z79899 Other long term (current) drug therapy: Secondary | ICD-10-CM | POA: Insufficient documentation

## 2024-04-06 DIAGNOSIS — Z8249 Family history of ischemic heart disease and other diseases of the circulatory system: Secondary | ICD-10-CM | POA: Diagnosis not present

## 2024-04-06 DIAGNOSIS — I4891 Unspecified atrial fibrillation: Secondary | ICD-10-CM | POA: Diagnosis not present

## 2024-04-06 DIAGNOSIS — I110 Hypertensive heart disease with heart failure: Secondary | ICD-10-CM | POA: Insufficient documentation

## 2024-04-06 DIAGNOSIS — I471 Supraventricular tachycardia, unspecified: Secondary | ICD-10-CM | POA: Diagnosis not present

## 2024-04-06 DIAGNOSIS — Z6841 Body Mass Index (BMI) 40.0 and over, adult: Secondary | ICD-10-CM | POA: Diagnosis not present

## 2024-04-06 DIAGNOSIS — I10 Essential (primary) hypertension: Secondary | ICD-10-CM

## 2024-04-06 DIAGNOSIS — I493 Ventricular premature depolarization: Secondary | ICD-10-CM | POA: Insufficient documentation

## 2024-04-06 DIAGNOSIS — E039 Hypothyroidism, unspecified: Secondary | ICD-10-CM | POA: Insufficient documentation

## 2024-04-06 DIAGNOSIS — I4892 Unspecified atrial flutter: Secondary | ICD-10-CM | POA: Insufficient documentation

## 2024-04-06 DIAGNOSIS — I5022 Chronic systolic (congestive) heart failure: Secondary | ICD-10-CM | POA: Diagnosis not present

## 2024-04-06 DIAGNOSIS — Z7901 Long term (current) use of anticoagulants: Secondary | ICD-10-CM | POA: Insufficient documentation

## 2024-04-06 DIAGNOSIS — I483 Typical atrial flutter: Secondary | ICD-10-CM

## 2024-04-06 DIAGNOSIS — E669 Obesity, unspecified: Secondary | ICD-10-CM | POA: Diagnosis not present

## 2024-04-06 LAB — BASIC METABOLIC PANEL WITH GFR
Anion gap: 9 (ref 5–15)
BUN: 14 mg/dL (ref 6–20)
CO2: 25 mmol/L (ref 22–32)
Calcium: 9.5 mg/dL (ref 8.9–10.3)
Chloride: 103 mmol/L (ref 98–111)
Creatinine, Ser: 0.97 mg/dL (ref 0.44–1.00)
GFR, Estimated: >60 mL/min (ref >60)
Glucose, Bld: 74 mg/dL (ref 70–99)
Potassium: 4 mmol/L (ref 3.5–5.1)
Sodium: 137 mmol/L (ref 135–145)

## 2024-04-06 NOTE — Patient Instructions (Signed)
  Lab Work:  Labs done today, your results will be available in MyChart, we will contact you for abnormal readings.  Follow-Up in: 3 months as scheduled with Dr. Mclean   At the [facility], you and your health needs are our priority. We have a designated team specialized in the treatment of Heart Failure. This Care Team includes your primary Heart Failure Specialized Cardiologist (physician), Advanced Practice Providers (APPs- Physician Assistants and Nurse Practitioners), and Pharmacist who all work together to provide you with the care you need, when you need it.   You may see any of the following providers on your designated Care Team at your next follow up:  Dr. Toribio Fuel Dr. Ezra Shuck Dr. Ria Commander Dr. Odis Brownie Greig Mosses, NP Caffie Shed, [place] [facility] [place], [place] Beckey Coe, NP [place] Lee, NP Tinnie Redman, PharmD   Please be sure to bring in all your medications bottles to every appointment.   Need to Contact Us :  If you have any questions or concerns before your next appointment please send us  a message through [place] or call our office at [phone].    TO LEAVE A MESSAGE FOR THE NURSE SELECT OPTION 2, PLEASE LEAVE A MESSAGE INCLUDING: YOUR NAME DATE OF BIRTH CALL BACK NUMBER REASON FOR CALL**this is important as we prioritize the call backs  YOU WILL RECEIVE A CALL BACK THE SAME DAY AS LONG AS YOU CALL BEFORE 4:00 PM

## 2024-04-06 NOTE — Progress Notes (Signed)
 Patient ID: Suzanne Long, female   DOB: [birth year], 53 y.o.   MRN: [id number] PCP: Benjamine Aland, MD HF Cardiology: Dr. Rolan  53 y.o. with history of HTN and nonischemic cardiomyopathy presents for followup of CHF.      She was diagnosed with CHF initially in 2011.  At that time, she was hospitalized with volume overload.  Coronary angiography in 2011 showed no significant CAD, but EF was about 25%.  BP was very high.  She was started on cardiac meds and BP improved.  Per her report, EF improved.  She eventually stopped all her medications.  She was then hospitalized in 11/16 with CHF exacerbation. EF was 30-35% on 11/16 echo and 30-35% on 2/17 echo.   Echo in 7/17 showed EF up to 40%.   She stopped her medications again.  She was then admitted in 3/22 with atrial flutter/RVR and CHF.  Unsure how long atrial flutter was present as she did not feel palpitations.  She had TEE-guided DCCV.  TEE showed EF 20-25% with severely decreased RV function, mild MR.  LHC in 3/22 showed no significant CAD.  Cardiac MRI in 6/22 showed LV EF 31%, RVEF 52%, basal-mid lateral subendocardial LGE (>50% wall thickness), septal mid-wall LGE.  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 She was seen by Dr. Fairy, and this gene mutation was actually not thought to be the cause of DMD in her family (she likely has a mutation that was not picked up by the [employer] testing).   In 7/22, she had atrial flutter ablation.   Echo in 10/22 showed EF 20-25% with moderate LV dilation, normal RV, normal IVC.   Patient went to the ER in 06/19/21 with SVT, ?atypical flutter with 2:1 block.  She was in the ER 07/11/21 with SVT rate >200, she was cardioverted by EMS and she was in NSR in the ER.  Zio monitor in 1/23 showed 8 runs NSVT, longest 19 beats and 23 runs SVT (?atrial tachycardia), longest 16 beats.  No atrial fibrillation.   Follow up with Dr. Inocencio 1/23, discussed ICD but she is hesitant due to  her keloids.   In 9/23, she had both atypical atrial flutter and atrial fibrillation, she converted to NSR with amiodarone .   Echo 3/24 showed EF 25-30%, mild RV dysfunction.   S/p AF & AFL ablation in 4/24.  Echo 7/25 showed EF 25% with mild LV dilation, normal RV size with moderately decreased systolic function, mild MR, normal IVC.   Today she returns for HF follow up with her husband. Overall feeling fine. Works out 5x/week at [employer], no dyspnea with this. Denies palpitations, abnormal bleeding, CP, dizziness, edema, or PND/Orthopnea. Appetite ok. Weight at home 264 pounds. Taking all medications.   Not interested in ICD due to significant keloid formation.  ECG (personally reviewed): none ordered today  Labs (3/25): LDL 107, hgb 14, BNP 116, K 4.4, creatinine 1.13 Labs (7/25): K 4.2, creatinine 0.98, normal TSH  PMH: 1. Chronic systolic CHF: 1st noted in 2011.  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  - LHC (2011) with no significant CAD, EF 25%.   - Echo (11/16) with EF 30-35%, diffuse hypokinesis. - Echo (2/17) with EF 30-35%, moderate LVH, diffuse hypokinesis.  - Echo (7/17) with EF 40%, severe LV dilation, grade II diastolic dysfunction.  - TEE (6/77)  with EF 20-25% with severely decreased RV function, mild MR - LHC (3/22): No significant CAD.  - Cardiac MRI (6/22): LV EF 31%, RVEF 52%, basal-mid lateral subendocardial LGE (>50% wall thickness), septal mid-wall LGE.   - Echo (10/22): EF 20-25% with moderate LV dilation, normal RV, normal IVC.  - Echo (3/24): EF 25-30%, mild RV dysfunction.  - Echo (7/25): EF 25% with mild LV dilation, normal RV size with moderately decreased systolic function, mild MR, normal IVC.  - CPX (8/25): Severe functional limitation due to HF and obesity; Peak VO2: 7.4 (44.2% predicted peak VO2) , VE/VCO2 slope:  34, OUES:  1.1 (45% predicted) , Peak RER: 1.1  2. HTN 3. Hypothyroidism 4. Obesity.  5. Goiter 6. Atrial flutter: 3/22, s/p DCCV.  - Ablation in 7/22.  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  8. PVCs: Zio monitor (8/22) with 8% PVCs, no AF/AFL 9. SVT: noted in ER x 2 in 12/22, once possible atypical flutter and once cardioverted before reached ER.  - Zio (1/23): 8 runs NSVT, longest 19 beats and 23 runs SVT (?atrial tachycardia), longest 16 beats.  No atrial fibrillation.  10. Atrial fibrillation noted in 9/23.  - s/p AF and AFL ablation 4/24  SH:  Nonsmoker.  Rare ETOH.  No drugs.    FH: Mother, grandmother with HTN.  Uncle with MI. 2 sons with Duchenne muscular dystrophy (both have passed away).  Brother with Duchenne muscular dystrophy.  ROS: All systems reviewed and negative except as per HPI.   Current Outpatient Medications  Medication Sig Dispense Refill   apixaban  (ELIQUIS ) 5 MG TABS tablet Take 1 tablet (5 mg total) by mouth 2 (two) times daily. 180 tablet 3   BIOTIN PO Take 1 tablet by mouth daily.     carvedilol  (COREG ) 12.5 MG tablet Take 1.5 tablets (18.75 mg total) by mouth 2 (two) times daily with a meal. 180 tablet 3   empagliflozin  (JARDIANCE ) 10 MG TABS tablet Take 1 tablet (10 mg total) by mouth daily. 90 tablet 3   furosemide  (LASIX ) 20 MG tablet TAKE 1 TABLET DAILY (DOSE CHANGE) (Patient taking differently: Take 20 mg by mouth every other day.) 90 tablet 3   isosorbide -hydrALAZINE  (BIDIL) 20-37.5 MG tablet Take 0.5 tablets by mouth 3 (three) times daily. 135 tablet 3   Multiple Vitamin (MULTIVITAMIN WITH MINERALS) TABS tablet Take 1 tablet by mouth daily.     polyvinyl alcohol (LIQUIFILM TEARS) 1.4 % ophthalmic solution Place 1 drop into both eyes in the morning, at noon, and at bedtime.     sacubitril -valsartan  (ENTRESTO ) 97-103 MG Take 1 tablet by mouth 2 (two) times daily. 180 tablet 3   semaglutide -weight management (WEGOVY ) 2.4 MG/0.75ML  SOAJ SQ injection Inject 2.4 mg into the skin once a week. 3 mL 5   spironolactone  (ALDACTONE ) 25 MG tablet Take 1 tablet (25 mg total) by mouth daily. 90 tablet 3   No current facility-administered medications for this encounter.   Wt Readings from Last 3 Encounters:  04/06/24 121.2 kg (267 lb 3.2 oz)  02/04/24 127.6 kg (281 lb 3.2 oz)  12/08/23 130.6 kg (288 lb)   BP 108/62   Pulse 78   Ht 5' 8.5 (1.74 m)   Wt 121.2 kg (267 lb 3.2 oz)   SpO2 99%   BMI 40.04 kg/m  Physical Exam General:  NAD. No resp difficulty, walked into clinic HEENT: Normal Neck: Supple. No JVD. Cor: Regular rate & rhythm. No rubs, gallops  or murmurs. Lungs: Clear Abdomen: Soft, nontender, nondistended.  Extremities: No cyanosis, clubbing, rash, edema Neuro: Alert & oriented x 3, moves all 4 extremities w/o difficulty. Affect pleasant.  Assessment/Plan: 1. Chronic systolic CHF: Nonischemic cardiomyopathy. No CAD on coronary angiography in 2011 and 3/22.  She had 2 sons and a brother with Duchenne's muscular dystrophy.  She notes chronic muscle weakness with squat->stand.  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 TEE in 3/22 showed EF 20-25% with severe RV dysfunction.  Cardiac MRI in 6/22 showed LV EF 31%, RVEF 52%, basal-mid lateral subendocardial LGE (>50% wall thickness), septal mid-wall LGE.  LGE, especially in the inferolateral wall, is seen with DMD-related cardiomyopathy. Echo in 10/22 showed EF 20-25%, moderate LV dilation, normal RV.  Echo in 3/24 showed EF 25-30%, mild RV dysfunction. Echo 7/25 showed EF 25% with mild LV dilation, normal RV size with moderately decreased systolic function, mild MR, normal IVC. CPX 8/25 consistent with severe function limitation due to HF & obesity. NYHA class I, she is not volume overloaded on exam.  - Continue Coreg  18.75 mg bid.  - Continue Entresto   97/103 bid.  - Continue Lasix  20 mg every other day. BMET today. - Continue spironolactone  25 mg daily. - Continue Jardiance  10 mg daily.  No GU symptoms - Continue Bidil 1/2 tab tid.  - She does not want to have an ICD placed though we have strongly recommended it given significant LGE present on cMRI.  She is very concerned about keloid formation, apparently very pruritic and bothersome when these have formed on her in the past.   2. HTN: BP controlled.   - Meds as above.  3. H/o hypothyroidism: No thyroid  replacement. Previously following with endocrinology for goiter. Advised follow up. 4. Obesity: Body mass index is 40.04 kg/m. Weight is trending down, has lost ~ 25 lbs.  - Continue semaglutide .  5. Atrial flutter/atrial fibrillation: Typical flutter in 3/22 with CHF decompensation.  Now s/p DCCV back to NSR and s/p atrial flutter/atrial fibrillation ablation 4/24. She is now off amiodarone .  Regular on exam. - Continue apixaban . No bleeding issues. 6. DMD heterozygote: I suspect this is the cause of her mild chronic muscle weakness. CPK has been mildly elevated.  7. PVCs: Frequent on 8/22 Zio monitor, 8% total. 5.7% on 1/23 Zio monitor.  - Continue Coreg  as above.  8. SVT: Noted on Zio in 1/23, ? atrial tachy.   Follow up in 3-4 months with Dr. Rolan Raisin [facility] FNP-BC 04/06/2024

## 2024-05-06 ENCOUNTER — Encounter (HOSPITAL_COMMUNITY)

## 2024-05-16 ENCOUNTER — Encounter (HOSPITAL_COMMUNITY): Admitting: Cardiology

## 2024-06-14 ENCOUNTER — Ambulatory Visit (INDEPENDENT_AMBULATORY_CARE_PROVIDER_SITE_OTHER)

## 2024-06-14 ENCOUNTER — Encounter (HOSPITAL_COMMUNITY): Payer: Self-pay | Admitting: *Deleted

## 2024-06-14 ENCOUNTER — Ambulatory Visit (HOSPITAL_COMMUNITY)
Admission: EM | Admit: 2024-06-14 | Discharge: 2024-06-14 | Disposition: A | Discharge status: Home / Self Care | Attending: Emergency Medicine | Admitting: Emergency Medicine

## 2024-06-14 DIAGNOSIS — K5903 Drug induced constipation: Secondary | ICD-10-CM

## 2024-06-14 DIAGNOSIS — R109 Unspecified abdominal pain: Secondary | ICD-10-CM

## 2024-06-14 MED ORDER — LACTULOSE 10 GM/15ML PO SOLN: 20.0000 g | Freq: Every day | ORAL | 0 refills | Status: AC | PRN

## 2024-06-14 NOTE — Discharge Instructions (Addendum)
 Start the lactulose and use this up to three times daily.   For moderate to severe constipation (not having a bowel movement in more than 3 days) then try to use an enema or Miralax once daily until you have a good bowel movement.  It is not a good idea to use an enema or laxatives daily. If you find you are doing this, then please follow up with a gastroenterologist. Otherwise, a medication you could use daily to help with promoting bowel movements is docusate (Colace) 100mg . It is okay to use this 1-2 times daily as a stool softener.  Try to stay active physically including regular exercise 2-3 times a week.  Make sure you hydrate well every day with about 64 ounces of water daily (that is 2 liters).  Try to avoid carb heavy foods, dairy. This includes cutting out breads, pasta, pizza, pastries, potatoes, rice, starchy foods in general. Eat more fiber as listed below:  Salads - kale, spinach, cabbage, spring mix, arugula Fruits - avocadoes, berries (blueberries, raspberries, blackberries), apples, oranges, pomegranate, grapefruit, kiwi Vegetables - asparagus, cauliflower, broccoli, green beans, brussel sprouts, bell peppers, beets; stay away from or limit starchy vegetables like potatoes, carrots, peas Other general foods - kidney beans, egg whites, almonds, walnuts, sunflower seeds, pumpkin seeds, fat free yogurt, almond milk, flax seeds, quinoa, oats  Meat - It is better to eat lean meats and limit your red meat including pork to once a week.  Wild caught fish, chicken breast are good options as they tend to be leaner sources of good protein. Still be mindful of the sodium labels for the meats you buy.  DO NOT EAT ANY FOODS ON THIS LIST THAT YOU ARE ALLERGIC TO. For more specific needs, I highly recommend consulting a dietician or nutritionist but this can definitely be a good starting point.

## 2024-06-14 NOTE — ED Provider Notes (Signed)
 [facility]    CSN: [id number] Arrival date & time: 06/14/24  0945      History   Chief Complaint Chief Complaint  Patient presents with   Abdominal Pain   Constipation    HPI Suzanne Long is a 53 y.o. female.   Patient presents to clinic over concern of constipation and inability to have a bowel movement.  She has struggled with this chronically but this has gotten worse since she has been on Wegovy .  Has been on her current dose of Wegovy  for the last 2 or months or so.  Over the past few months she has tried Colace, fiber, Fleet enema, and MiraLAX for constipation.  She did have a small bowel movement yesterday.  Had to strain to evacuate her bowels and felt like she was not emptying completely.  Did have an episode of vomiting yesterday morning, she had an orange and some food.  It went to have a bowel movement and vomited once afterwards.  Is not nauseous with current vomiting today.  Left lower quadrant abdominal pain.  Is passing gas, passed gas last night.  The history is provided by the patient and medical records.  Abdominal Pain Constipation   Past Medical History:  Diagnosis Date   CHF (congestive heart failure) (HCC)    Dyspnea    Hypertension    Hypothyroidism    Joint pain    Multiple food allergies    NICM (nonischemic cardiomyopathy) (HCC)    a. Echo 2/17:  moderate LVH, EF 30-35%, diffuse HK, moderate LAE   NICM (nonischemic cardiomyopathy) (HCC) 09/26/2020   Non-ST elevation (NSTEMI) myocardial infarction (HCC) 09/26/2020    Patient Active Problem List   Diagnosis Date Noted   Paroxysmal atrial fibrillation (HCC) 11/18/2022   Hypercoagulable state due to paroxysmal atrial fibrillation (HCC) 04/28/2022   Non-ST elevation (NSTEMI) myocardial infarction (HCC) 09/26/2020   Anticoagulation adequate 09/26/2020   NICM (nonischemic cardiomyopathy) (HCC) 09/26/2020   Atrial flutter (HCC)    Insulin  resistance 07/22/2019   Other  hyperlipidemia 07/22/2019   Vitamin D  deficiency 07/22/2019   Goiter 02/01/2016   Chronic systolic CHF (congestive heart failure) (HCC) 11/22/2015   Elevated troponin 05/29/2015   CHF exacerbation (HCC) 05/29/2015   Accelerated hypertension    Noncompliance with medication regimen    Papilledema 07/27/2014   Acute on chronic systolic and diastolic heart failure, NYHA class 1 (HCC) 02/09/2014   Essential hypertension 02/09/2014   Class 3 severe obesity with serious comorbidity and body mass index (BMI) of 40.0 to 44.9 in adult [facility]) 02/09/2014   Subclinical hypothyroidism 02/09/2014   Keloid scar 03/04/2013    Past Surgical History:  Procedure Laterality Date   A-FLUTTER ABLATION N/A 01/22/2021   Procedure: A-FLUTTER ABLATION;  Surgeon: Inocencio Soyla Lunger, MD;  Location: [facility];  Service: Cardiovascular;  Laterality: N/A;   ATRIAL FIBRILLATION ABLATION N/A 10/21/2022   Procedure: ATRIAL FIBRILLATION ABLATION;  Surgeon: Inocencio Soyla Lunger, MD;  Location: [facility];  Service: Cardiovascular;  Laterality: N/A;   BUBBLE STUDY  09/26/2020   Procedure: BUBBLE STUDY;  Surgeon: Hobart Powell BRAVO, MD;  Location: [facility] ENDOSCOPY;  Service: Cardiovascular;;   CARDIOVERSION N/A 09/26/2020   Procedure: CARDIOVERSION;  Surgeon: Hobart Powell BRAVO, MD;  Location: [facility] ENDOSCOPY;  Service: Cardiovascular;  Laterality: N/A;   LEFT HEART CATH AND CORONARY ANGIOGRAPHY N/A 09/24/2020   Procedure: LEFT HEART CATH AND CORONARY ANGIOGRAPHY;  Surgeon: Court Dorn PARAS, MD;  Location: [facility] INVASIVE  CV LAB;  Service: Cardiovascular;  Laterality: N/A;   MYOMECTOMY     None     TEE WITHOUT CARDIOVERSION N/A 09/26/2020   Procedure: TRANSESOPHAGEAL ECHOCARDIOGRAM (TEE);  Surgeon: Hobart Powell BRAVO, MD;  Location: [facility] ENDOSCOPY;  Service: Cardiovascular;  Laterality: N/A;    OB History     Gravida  2   Para      Term      Preterm      AB      Living  2      SAB      IAB       Ectopic      Multiple      Live Births               Home Medications    Prior to Admission medications   Medication Sig Start Date End Date Taking? Authorizing Provider  apixaban  (ELIQUIS ) 5 MG TABS tablet Take 1 tablet (5 mg total) by mouth 2 (two) times daily. 03/23/24 03/18/25 Yes Rolan Ezra RAMAN, MD  BIOTIN PO Take 1 tablet by mouth daily.   Yes [provider]  carvedilol  (COREG ) 12.5 MG tablet Take 1.5 tablets (18.75 mg total) by mouth 2 (two) times daily with a meal. 03/23/24  Yes Rolan Ezra RAMAN, MD  empagliflozin  (JARDIANCE ) 10 MG TABS tablet Take 1 tablet (10 mg total) by mouth daily. 03/23/24  Yes Rolan Ezra RAMAN, MD  furosemide  (LASIX ) 20 MG tablet TAKE 1 TABLET DAILY (DOSE CHANGE) Patient taking differently: Take 20 mg by mouth every other day. 04/16/23  Yes Rolan Ezra RAMAN, MD  isosorbide -hydrALAZINE  (BIDIL) 20-37.5 MG tablet Take 0.5 tablets by mouth 3 (three) times daily. 03/23/24  Yes Rolan Ezra RAMAN, MD  lactulose  (CHRONULAC ) 10 GM/15ML solution Take 30 mLs (20 g total) by mouth daily as needed for mild constipation. 06/14/24  Yes Raeanna Soberanes  N, FNP  Multiple Vitamin (MULTIVITAMIN WITH MINERALS) TABS tablet Take 1 tablet by mouth daily.   Yes [provider]  sacubitril -valsartan  (ENTRESTO ) 97-103 MG Take 1 tablet by mouth 2 (two) times daily. 03/23/24  Yes Rolan Ezra RAMAN, MD  semaglutide -weight management (WEGOVY ) 2.4 MG/0.75ML SOAJ SQ injection Inject 2.4 mg into the skin once a week. 02/10/24  Yes Rolan Ezra RAMAN, MD  spironolactone  (ALDACTONE ) 25 MG tablet Take 1 tablet (25 mg total) by mouth daily. 03/23/24  Yes Rolan Ezra RAMAN, MD  polyvinyl alcohol (LIQUIFILM TEARS) 1.4 % ophthalmic solution Place 1 drop into both eyes in the morning, at noon, and at bedtime.    [provider]    Family History Family History  Problem Relation Age of Onset   Diabetes Mother    Hypertension Mother    Depression Mother    Obesity Mother     Muscular dystrophy Brother    Kidney failure Brother    Muscular dystrophy Son    Migraines Neg Hx    Multiple sclerosis Neg Hx    Thyroid  disease Neg Hx     Social History Social History   Tobacco Use   Smoking status: Never   Smokeless tobacco: Never   Tobacco comments:    Never smoke 04/28/22  Vaping Use   Vaping status: Never Used  Substance Use Topics   Alcohol use: Yes    Alcohol/week: 2.0 - 3.0 standard drinks of alcohol    Types: 2 - 3 Standard drinks or equivalent per week    Comment: 2-3 a year 04/28/22   Drug use: Never  Allergies   Shellfish allergy, Gadolinium derivatives, Iodine, and Latex   Review of Systems Review of Systems  Per HPI  Physical Exam Triage Vital Signs ED Triage Vitals  Encounter Vitals Group     BP 06/14/24 1004 (!) 132/93     Girls Systolic BP Percentile --      Girls Diastolic BP Percentile --      Boys Systolic BP Percentile --      Boys Diastolic BP Percentile --      Pulse Rate 06/14/24 1004 83     Resp 06/14/24 1004 16     Temp 06/14/24 1004 98.1 F (36.7 C)     Temp Source 06/14/24 1004 Oral     SpO2 06/14/24 1004 96 %     Weight --      Height --      Head Circumference --      Peak Flow --      Pain Score 06/14/24 1002 6     Pain Loc --      Pain Education --      Exclude from Growth Chart --    No data found.  Updated Vital Signs BP (!) 132/93 (BP Location: Left Arm)   Pulse 83   Temp 98.1 F (36.7 C) (Oral)   Resp 16   LMP 10/17/2021   SpO2 96%   Visual Acuity Right Eye Distance:   Left Eye Distance:   Bilateral Distance:    Right Eye Near:   Left Eye Near:    Bilateral Near:     Physical Exam Vitals and nursing note reviewed.  Constitutional:      Appearance: She is well-developed.  HENT:     Head: Normocephalic and atraumatic.     Mouth/Throat:     Mouth: Mucous membranes are moist.  Cardiovascular:     Rate and Rhythm: Normal rate.  Pulmonary:     Effort: Pulmonary effort is  normal. No respiratory distress.  Abdominal:     General: Abdomen is flat.     Palpations: Abdomen is soft.     Tenderness: There is abdominal tenderness in the left upper quadrant and left lower quadrant. There is no guarding or rebound.     Hernia: No hernia is present.  Skin:    General: Skin is warm and dry.  Neurological:     General: No focal deficit present.     Mental Status: She is alert and oriented to person, place, and time.  Psychiatric:        Mood and Affect: Mood normal.        Behavior: Behavior normal.      UC Treatments / Results  Labs (all labs ordered are listed, but only abnormal results are displayed) Labs Reviewed - No data to display  EKG   Radiology DG Abd 2 Views Result Date: 06/14/2024 CLINICAL DATA:  Left lower quadrant abdominal pain radiating to the back. Constipation. EXAM: ABDOMEN - 2 VIEW COMPARISON:  Abdomen and pelvis CT dated 08/18/2022 FINDINGS: Normal bowel gas pattern without free peritoneal air. Small stool volume. Unremarkable bones. IMPRESSION: Normal examination. Electronically Signed   By: Elspeth Bathe M.D.   On: 06/14/2024 11:08    Procedures Procedures (including critical care time)  Medications Ordered in UC Medications - No data to display  Initial Impression / Assessment and Plan / UC Course  I have reviewed the triage vital signs and the nursing notes.  Pertinent labs & imaging results that were available  during my care of the patient were reviewed by me and considered in my medical decision making (see chart for details).  Vitals and triage reviewed, patient is hemodynamically stable.  Abdomen is soft with active bowel sounds.  Left upper and lower quadrant tenderness, without rebound or guarding.  Imaging shows some stool burden, confirmed with radiology overread.  Lower concern for obstruction with recent passing gas and bowel movements.  Has had overall diminished oral take on the Wegovy .  Will trial lactulose for  constipation, encouraged increasing fiber, Colace and MiraLAX as needed.  Strict emergency precautions if symptoms evolve or worsen.  Patient verbalized understanding, no questions at this time.    Final Clinical Impressions(s) / UC Diagnoses   Final diagnoses:  Abdominal pain, unspecified abdominal location  Drug-induced constipation     Discharge Instructions      Start the lactulose and use this up to three times daily.   For moderate to severe constipation (not having a bowel movement in more than 3 days) then try to use an enema or Miralax once daily until you have a good bowel movement.  It is not a good idea to use an enema or laxatives daily. If you find you are doing this, then please follow up with a gastroenterologist. Otherwise, a medication you could use daily to help with promoting bowel movements is docusate (Colace) 100mg . It is okay to use this 1-2 times daily as a stool softener.  Try to stay active physically including regular exercise 2-3 times a week.  Make sure you hydrate well every day with about 64 ounces of water daily (that is 2 liters).  Try to avoid carb heavy foods, dairy. This includes cutting out breads, pasta, pizza, pastries, potatoes, rice, starchy foods in general. Eat more fiber as listed below:  Salads - kale, spinach, cabbage, spring mix, arugula Fruits - avocadoes, berries (blueberries, raspberries, blackberries), apples, oranges, pomegranate, grapefruit, kiwi Vegetables - asparagus, cauliflower, broccoli, green beans, brussel sprouts, bell peppers, beets; stay away from or limit starchy vegetables like potatoes, carrots, peas Other general foods - kidney beans, egg whites, almonds, walnuts, sunflower seeds, pumpkin seeds, fat free yogurt, almond milk, flax seeds, quinoa, oats  Meat - It is better to eat lean meats and limit your red meat including pork to once a week.  Wild caught fish, chicken breast are good options as they tend to be leaner sources of  good protein. Still be mindful of the sodium labels for the meats you buy.  DO NOT EAT ANY FOODS ON THIS LIST THAT YOU ARE ALLERGIC TO. For more specific needs, I highly recommend consulting a dietician or nutritionist but this can definitely be a good starting point.     ED Prescriptions     Medication Sig Dispense Auth. Provider   lactulose (CHRONULAC) 10 GM/15ML solution Take 30 mLs (20 g total) by mouth daily as needed for mild constipation. 236 mL Dreama, Madalene Mickler  N, FNP      PDMP not reviewed this encounter.   Dreama Crispin SAILOR, FNP 06/14/24 [phone]

## 2024-06-14 NOTE — ED Triage Notes (Signed)
 Pt states she is on Wegovy  and can't have a BM she is having LLQ abdominal pain that radiates to her lower back . She has tried colace, miralax, fiber one with no results.

## 2024-07-15 ENCOUNTER — Ambulatory Visit (HOSPITAL_COMMUNITY)
Admission: RE | Admit: 2024-07-15 | Discharge: 2024-07-15 | Disposition: A | Discharge status: Home / Self Care | Source: Ambulatory Visit | Attending: Cardiology | Admitting: Cardiology

## 2024-07-15 ENCOUNTER — Encounter (HOSPITAL_COMMUNITY): Payer: Self-pay | Admitting: Cardiology

## 2024-07-15 VITALS — BP 108/74 | HR 83 | Ht 68.5 in | Wt 244.4 lb

## 2024-07-15 DIAGNOSIS — Z7901 Long term (current) use of anticoagulants: Secondary | ICD-10-CM | POA: Insufficient documentation

## 2024-07-15 DIAGNOSIS — I493 Ventricular premature depolarization: Secondary | ICD-10-CM | POA: Diagnosis not present

## 2024-07-15 DIAGNOSIS — M6281 Muscle weakness (generalized): Secondary | ICD-10-CM | POA: Insufficient documentation

## 2024-07-15 DIAGNOSIS — I110 Hypertensive heart disease with heart failure: Secondary | ICD-10-CM | POA: Insufficient documentation

## 2024-07-15 DIAGNOSIS — E669 Obesity, unspecified: Secondary | ICD-10-CM | POA: Diagnosis not present

## 2024-07-15 DIAGNOSIS — I5022 Chronic systolic (congestive) heart failure: Secondary | ICD-10-CM | POA: Insufficient documentation

## 2024-07-15 DIAGNOSIS — I4892 Unspecified atrial flutter: Secondary | ICD-10-CM | POA: Insufficient documentation

## 2024-07-15 DIAGNOSIS — I428 Other cardiomyopathies: Secondary | ICD-10-CM | POA: Insufficient documentation

## 2024-07-15 DIAGNOSIS — E039 Hypothyroidism, unspecified: Secondary | ICD-10-CM | POA: Diagnosis not present

## 2024-07-15 DIAGNOSIS — I4891 Unspecified atrial fibrillation: Secondary | ICD-10-CM | POA: Insufficient documentation

## 2024-07-15 DIAGNOSIS — Z8249 Family history of ischemic heart disease and other diseases of the circulatory system: Secondary | ICD-10-CM | POA: Diagnosis not present

## 2024-07-15 DIAGNOSIS — R9431 Abnormal electrocardiogram [ECG] [EKG]: Secondary | ICD-10-CM | POA: Diagnosis not present

## 2024-07-15 DIAGNOSIS — Z79899 Other long term (current) drug therapy: Secondary | ICD-10-CM | POA: Diagnosis not present

## 2024-07-15 DIAGNOSIS — Z6836 Body mass index (BMI) 36.0-36.9, adult: Secondary | ICD-10-CM | POA: Diagnosis not present

## 2024-07-15 LAB — BASIC METABOLIC PANEL WITH GFR
Anion gap: 10 (ref 5–15)
BUN: 9 mg/dL (ref 6–20)
CO2: 25 mmol/L (ref 22–32)
Calcium: 9.7 mg/dL (ref 8.9–10.3)
Chloride: 107 mmol/L (ref 98–111)
Creatinine, Ser: 0.69 mg/dL (ref 0.44–1.00)
GFR, Estimated: >60 mL/min
Glucose, Bld: 87 mg/dL (ref 70–99)
Potassium: 4.2 mmol/L (ref 3.5–5.1)
Sodium: 142 mmol/L (ref 135–145)

## 2024-07-15 MED ORDER — CARVEDILOL 25 MG PO TABS: 25.0000 mg | ORAL_TABLET | Freq: Two times a day (BID) | ORAL | 5 refills | Status: AC

## 2024-07-15 NOTE — Patient Instructions (Signed)
 Medication Changes:  INCREASE CARVEDILOL  TO 25MG  TWICE DAILY   Lab Work:  Labs done today, your results will be available in MyChart, we will contact you for abnormal readings.  Follow-Up in: 3 MONTHS WITH APP CLINIC   At the [facility], you and your health needs are our priority. We have a designated team specialized in the treatment of Heart Failure. This Care Team includes your primary Heart Failure Specialized Cardiologist (physician), Advanced Practice Providers (APPs- Physician Assistants and Nurse Practitioners), and Pharmacist who all work together to provide you with the care you need, when you need it.   You may see any of the following providers on your designated Care Team at your next follow up:  Dr. Toribio Fuel Dr. Ezra Shuck Dr. Odis Brownie Greig Mosses, NP Caffie Shed, [place] [facility] [place], [place] Beckey Coe, NP Jordan Lee, NP Tinnie Redman, PharmD   Please be sure to bring in all your medications bottles to every appointment.   Need to Contact Us :  If you have any questions or concerns before your next appointment please send us  a message through [facility] or call our office at [phone].    TO LEAVE A MESSAGE FOR THE NURSE SELECT OPTION 2, PLEASE LEAVE A MESSAGE INCLUDING: YOUR NAME DATE OF BIRTH CALL BACK NUMBER REASON FOR CALL**this is important as we prioritize the call backs  YOU WILL RECEIVE A CALL BACK THE SAME DAY AS LONG AS YOU CALL BEFORE 4:00 PM

## 2024-07-17 ENCOUNTER — Ambulatory Visit (HOSPITAL_COMMUNITY): Payer: Self-pay | Admitting: Cardiology

## 2024-07-17 NOTE — Progress Notes (Signed)
 Patient ID: Suzanne Long, female   DOB: 1971/02/09, 54 y.o.   MRN: [id number] PCP: Benjamine Aland, MD HF Cardiology: Dr. Rolan  54 y.o. with history of HTN and nonischemic cardiomyopathy presents for followup of CHF.      She was diagnosed with CHF initially in 2011.  At that time, she was hospitalized with volume overload.  Coronary angiography in 2011 showed no significant CAD, but EF was about 25%.  BP was very high.  She was started on cardiac meds and BP improved.  Per her report, EF improved.  She eventually stopped all her medications.  She was then hospitalized in 11/16 with CHF exacerbation. EF was 30-35% on 11/16 echo and 30-35% on 2/17 echo.   Echo in 7/17 showed EF up to 40%.   She stopped her medications again.  She was then admitted in 3/22 with atrial flutter/RVR and CHF.  Unsure how long atrial flutter was present as she did not feel palpitations.  She had TEE-guided DCCV.  TEE showed EF 20-25% with severely decreased RV function, mild MR.  LHC in 3/22 showed no significant CAD.  Cardiac MRI in 6/22 showed LV EF 31%, RVEF 52%, basal-mid lateral subendocardial LGE (>50% wall thickness), septal mid-wall LGE.  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 She was seen by Dr. Fairy, and this gene mutation was actually not thought to be the cause of DMD in her family (she likely has a mutation that was not picked up by the [employer] testing).   In 7/22, she had atrial flutter ablation.   Echo in 10/22 showed EF 20-25% with moderate LV dilation, normal RV, normal IVC.   Patient went to the ER in 06/19/21 with SVT, ?atypical flutter with 2:1 block.  She was in the ER 07/11/21 with SVT rate >200, she was cardioverted by EMS and she was in NSR in the ER.  Zio monitor in 1/23 showed 8 runs NSVT, longest 19 beats and 23 runs SVT (?atrial tachycardia), longest 16 beats.  No atrial fibrillation.   Follow up with Dr. Inocencio 1/23, discussed ICD but she is hesitant due to  her keloids.   In 9/23, she had both atypical atrial flutter and atrial fibrillation, she converted to NSR with amiodarone .   Echo 3/24 showed EF 25-30%, mild RV dysfunction.   S/p AF & AFL ablation in 4/24.  Echo 7/25 showed EF 25% with mild LV dilation, normal RV size with moderately decreased systolic function, mild MR, normal IVC.   Today she returns for HF follow up with her husband. Weight is down 23 lbs, she is on Wegovy .  She goes to the gym 5 days/week, uses treadmill and lifts weights.  No dyspnea walking on flat ground.  No dyspnea walking up stairs but has a hard time with stairs due to muscle weakness.  No chest pain.  No lightheadedness or palpitations.   ECG (personally reviewed): NSR, septal and lateral Qs  Labs (3/25): LDL 107, hgb 14, BNP 116, K 4.4, creatinine 1.13 Labs (7/25): K 4.2, creatinine 0.98, normal TSH, BNP 207 Labs (9/25): K 4, creatinine 0.97  PMH: 1. Chronic systolic CHF: 1st noted in 2011.  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  - LHC (2011) with no significant CAD, EF 25%.   - Echo (11/16) with EF 30-35%, diffuse hypokinesis. - Echo (2/17) with EF  30-35%, moderate LVH, diffuse hypokinesis.  - Echo (7/17) with EF 40%, severe LV dilation, grade II diastolic dysfunction.  - TEE (6/77) with EF 20-25% with severely decreased RV function, mild MR - LHC (3/22): No significant CAD.  - Cardiac MRI (6/22): LV EF 31%, RVEF 52%, basal-mid lateral subendocardial LGE (>50% wall thickness), septal mid-wall LGE.   - Echo (10/22): EF 20-25% with moderate LV dilation, normal RV, normal IVC.  - Echo (3/24): EF 25-30%, mild RV dysfunction.  - Echo (7/25): EF 25% with mild LV dilation, normal RV size with moderately decreased systolic function, mild MR, normal IVC.  - CPX (8/25): Severe functional limitation due to HF and obesity; Peak VO2:  7.4 (44.2% predicted peak VO2) , VE/VCO2 slope:  34, OUES: 1.1 (45% predicted) , Peak RER: 1.1  2. HTN 3. Hypothyroidism 4. Obesity.  5. Goiter 6. Atrial flutter: 3/22, s/p DCCV.  - Ablation in 7/22.  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  8. PVCs: Zio monitor (8/22) with 8% PVCs, no AF/AFL 9. SVT: noted in ER x 2 in 12/22, once possible atypical flutter and once cardioverted before reached ER.  - Zio (1/23): 8 runs NSVT, longest 19 beats and 23 runs SVT (?atrial tachycardia), longest 16 beats.  No atrial fibrillation.  10. Atrial fibrillation noted in 9/23.  - s/p AF and AFL ablation 4/24  SH:  Nonsmoker.  Rare ETOH.  No drugs.    FH: Mother, grandmother with HTN.  Uncle with MI. 2 sons with Duchenne muscular dystrophy (both have passed away).  Brother with Duchenne muscular dystrophy.  ROS: All systems reviewed and negative except as per HPI.   Current Outpatient Medications  Medication Sig Dispense Refill   apixaban  (ELIQUIS ) 5 MG TABS tablet Take 1 tablet (5 mg total) by mouth 2 (two) times daily. 180 tablet 3   BIOTIN PO Take 1 tablet by mouth daily.     empagliflozin  (JARDIANCE ) 10 MG TABS tablet Take 1 tablet (10 mg total) by mouth daily. 90 tablet 3   furosemide  (LASIX ) 20 MG tablet TAKE 1 TABLET DAILY (DOSE CHANGE) (Patient taking differently: Take 20 mg by mouth every other day.) 90 tablet 3   isosorbide -hydrALAZINE  (BIDIL) 20-37.5 MG tablet Take 0.5 tablets by mouth 3 (three) times daily. 135 tablet 3   lactulose  (CHRONULAC ) 10 GM/15ML solution Take 30 mLs (20 g total) by mouth daily as needed for mild constipation. 236 mL 0   Multiple Vitamin (MULTIVITAMIN WITH MINERALS) TABS tablet Take 1 tablet by mouth daily.     polyvinyl alcohol (LIQUIFILM TEARS) 1.4 % ophthalmic solution Place 1 drop into both eyes in the morning, at noon, and at bedtime.     sacubitril -valsartan  (ENTRESTO ) 97-103 MG Take 1 tablet by mouth 2 (two) times daily. 180  tablet 3   semaglutide -weight management (WEGOVY ) 2.4 MG/0.75ML SOAJ SQ injection Inject 2.4 mg into the skin once a week. 3 mL 5   spironolactone  (ALDACTONE ) 25 MG tablet Take 1 tablet (25 mg total) by mouth daily. 90 tablet 3   carvedilol  (COREG ) 25 MG tablet Take 1 tablet (25 mg total) by mouth 2 (two) times daily with a meal. 60 tablet 5   No current facility-administered medications for this encounter.   Wt Readings from Last 3 Encounters:  07/15/24 110.9 kg (244 lb 6.4 oz)  04/06/24 121.2 kg (267 lb 3.2 oz)  02/04/24 127.6 kg (281 lb 3.2 oz)   BP 108/74   Pulse 83  Ht 5' 8.5 (1.74 m)   Wt 110.9 kg (244 lb 6.4 oz)   LMP 10/17/2021   SpO2 99%   BMI 36.62 kg/m  General: NAD Neck: No JVD, no thyromegaly or thyroid  nodule.  Lungs: Clear to auscultation bilaterally with normal respiratory effort. CV: Nondisplaced PMI.  Heart regular S1/S2, no S3/S4, no murmur.  No peripheral edema.  No carotid bruit.  Normal pedal pulses.  Abdomen: Soft, nontender, no hepatosplenomegaly, no distention.  Skin: Intact without lesions or rashes.  Neurologic: Alert and oriented x 3.  Psych: Normal affect. Extremities: No clubbing or cyanosis.  HEENT: Normal.   Assessment/Plan: 1. Chronic systolic CHF: Nonischemic cardiomyopathy. No CAD on coronary angiography in 2011 and 3/22.  She had 2 sons and a brother with Duchenne's muscular dystrophy.  She notes chronic muscle weakness with squat->stand.  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 TEE in 3/22 showed EF 20-25% with severe RV dysfunction.  Cardiac MRI in 6/22 showed LV EF 31%, RVEF 52%, basal-mid lateral subendocardial LGE (>50% wall thickness), septal mid-wall LGE.  LGE, especially in the inferolateral wall, is seen with DMD-related cardiomyopathy. Echo in 10/22 showed EF 20-25%, moderate LV dilation, normal RV.  Echo in 3/24 showed  EF 25-30%, mild RV dysfunction. Echo 7/25 showed EF 25% with mild LV dilation, normal RV size with moderately decreased systolic function, mild MR, normal IVC. CPX 8/25 consistent with severe function limitation due to HF & obesity. Symptoms are significantly out of proportion to CPX, she describes NYHA class I.  I suspect CPX is difficult to interpret due to her muscle weakness as a Duchenne's heterozygote.  She is not volume overloaded on exam.  - Increase Coreg  to 25 mg bid.  - Continue Entresto  97/103 bid.  - Continue Lasix  20 mg every other day. BMET/BNP today. - Continue spironolactone  25 mg daily. - Continue Jardiance  10 mg daily.  No GU symptoms - Continue Bidil 1/2 tab tid.  - She does not want to have an ICD placed though we have strongly recommended it given significant LGE present on cMRI.  She is very concerned about keloid formation, apparently very pruritic and bothersome when these have formed on her in the past.   2. HTN: BP controlled.   - Meds as above.  3. H/o hypothyroidism: No thyroid  replacement. Previously following with endocrinology for goiter. Advised follow up. 4. Obesity: Body mass index is 36.62 kg/m. Weight is trending down, has lost ~ 25 lbs.  - Continue semaglutide .  5. Atrial flutter/atrial fibrillation: Typical flutter in 3/22 with CHF decompensation.  Now s/p DCCV back to NSR and s/p atrial flutter/atrial fibrillation ablation 4/24. She is now off amiodarone . NSR today.  - Continue apixaban .  6. DMD heterozygote: I suspect this is the cause of her mild chronic muscle weakness. CPK has been mildly elevated.  7. PVCs: Frequent on 8/22 Zio monitor, 8% total. 5.7% on 1/23 Zio monitor.  - Increasing Coreg  as above.  8. SVT: Noted on Zio in 1/23, ? atrial tachy.   Follow up in 3 months with APP  I spent 31 minutes reviewing records, interviewing/examining patient, and managing orders.   Ezra Shuck  07/17/2024

## 2024-07-26 ENCOUNTER — Other Ambulatory Visit: Payer: Self-pay | Admitting: Cardiology

## 2024-07-26 ENCOUNTER — Other Ambulatory Visit: Payer: Self-pay

## 2024-07-26 ENCOUNTER — Other Ambulatory Visit (HOSPITAL_COMMUNITY): Payer: Self-pay

## 2024-07-26 DIAGNOSIS — I214 Non-ST elevation (NSTEMI) myocardial infarction: Secondary | ICD-10-CM

## 2024-07-26 MED ORDER — WEGOVY 2.4 MG/0.75ML ~~LOC~~ SOAJ
2.4000 mg | SUBCUTANEOUS | 5 refills | Status: AC
  Filled 2024-07-26: qty 3, 28d supply, fill #0

## 2024-10-13 ENCOUNTER — Ambulatory Visit (HOSPITAL_COMMUNITY)
